# Patient Record
Sex: Male | Born: 1970 | Race: Black or African American | Hispanic: No | Smoking: Never smoker
Health system: Southern US, Community
[De-identification: ages and names within clinical notes are randomized; demographics above are authoritative.]

## PROBLEM LIST (undated history)

## (undated) DIAGNOSIS — E119 Type 2 diabetes mellitus without complications: Secondary | ICD-10-CM

## (undated) DIAGNOSIS — R569 Unspecified convulsions: Secondary | ICD-10-CM

## (undated) DIAGNOSIS — I10 Essential (primary) hypertension: Secondary | ICD-10-CM

---

## 2002-11-16 ENCOUNTER — Emergency Department (HOSPITAL_COMMUNITY): Admission: EM | Admit: 2002-11-16 | Discharge: 2002-11-16 | Payer: Self-pay | Admitting: *Deleted

## 2002-11-23 ENCOUNTER — Emergency Department (HOSPITAL_COMMUNITY): Admission: EM | Admit: 2002-11-23 | Discharge: 2002-11-23 | Payer: Self-pay | Admitting: Emergency Medicine

## 2003-06-10 ENCOUNTER — Ambulatory Visit (HOSPITAL_COMMUNITY): Admission: RE | Admit: 2003-06-10 | Discharge: 2003-06-10 | Payer: Self-pay | Admitting: General Surgery

## 2003-06-14 ENCOUNTER — Ambulatory Visit (HOSPITAL_COMMUNITY): Admission: RE | Admit: 2003-06-14 | Discharge: 2003-06-14 | Payer: Self-pay | Admitting: Family Medicine

## 2004-04-19 ENCOUNTER — Ambulatory Visit (HOSPITAL_COMMUNITY): Admission: RE | Admit: 2004-04-19 | Discharge: 2004-04-19 | Payer: Self-pay | Admitting: Family Medicine

## 2004-06-05 ENCOUNTER — Emergency Department (HOSPITAL_COMMUNITY): Admission: EM | Admit: 2004-06-05 | Discharge: 2004-06-05 | Payer: Self-pay | Admitting: Emergency Medicine

## 2004-12-21 ENCOUNTER — Emergency Department (HOSPITAL_COMMUNITY): Admission: EM | Admit: 2004-12-21 | Discharge: 2004-12-21 | Payer: Self-pay | Admitting: Emergency Medicine

## 2005-10-04 ENCOUNTER — Emergency Department (HOSPITAL_COMMUNITY): Admission: EM | Admit: 2005-10-04 | Discharge: 2005-10-04 | Payer: Self-pay | Admitting: Emergency Medicine

## 2006-02-18 ENCOUNTER — Emergency Department (HOSPITAL_COMMUNITY): Admission: EM | Admit: 2006-02-18 | Discharge: 2006-02-18 | Payer: Self-pay | Admitting: *Deleted

## 2011-07-31 ENCOUNTER — Encounter (HOSPITAL_COMMUNITY): Payer: Self-pay | Admitting: *Deleted

## 2011-07-31 ENCOUNTER — Emergency Department (HOSPITAL_COMMUNITY)
Admission: EM | Admit: 2011-07-31 | Discharge: 2011-07-31 | Disposition: A | Discharge status: Home / Self Care | Payer: Self-pay | Attending: Emergency Medicine | Admitting: Emergency Medicine

## 2011-07-31 DIAGNOSIS — J45909 Unspecified asthma, uncomplicated: Secondary | ICD-10-CM | POA: Insufficient documentation

## 2011-07-31 MED ORDER — PREDNISONE 20 MG PO TABS
60.0000 mg | ORAL_TABLET | Freq: Once | ORAL | Status: AC
  Administered 2011-07-31: 60 mg via ORAL
  Filled 2011-07-31: qty 3

## 2011-07-31 MED ORDER — ALBUTEROL SULFATE HFA 108 (90 BASE) MCG/ACT IN AERS: 1.0000 | INHALATION_SPRAY | Freq: Four times a day (QID) | RESPIRATORY_TRACT | Status: DC | PRN

## 2011-07-31 MED ORDER — PREDNISONE 10 MG PO TABS: 20.0000 mg | ORAL_TABLET | Freq: Every day | ORAL | Status: AC

## 2011-07-31 MED ORDER — IPRATROPIUM BROMIDE 0.02 % IN SOLN
0.5000 mg | Freq: Once | RESPIRATORY_TRACT | Status: AC
  Administered 2011-07-31: 0.5 mg via RESPIRATORY_TRACT
  Filled 2011-07-31: qty 2.5

## 2011-07-31 MED ORDER — ALBUTEROL SULFATE (5 MG/ML) 0.5% IN NEBU
2.5000 mg | INHALATION_SOLUTION | Freq: Once | RESPIRATORY_TRACT | Status: AC
  Administered 2011-07-31: 2.5 mg via RESPIRATORY_TRACT
  Filled 2011-07-31: qty 0.5

## 2011-07-31 NOTE — ED Provider Notes (Signed)
History   This chart was scribed for [employer]. Colon Branch, MD by Clarita Crane. The patient was seen in room APA12/APA12. Patient's care was started at 0616.    CSN: [id number]  Arrival date & time 07/31/11  0616   First MD Initiated Contact with Patient 07/31/11 [phone]      Chief Complaint  Patient presents with  . Asthma    (Consider location/radiation/quality/duration/timing/severity/associated sxs/prior treatment) HPI Ricardo Rogers is a 41 y.o. male who presents to the Emergency Department complaining of constant moderate SOB similar to previous exacerbations of asthma onset seven days ago and worsening since with associated wheezing and cough. Reports use of inhaler provided mild relief but states he recently ran out of his albuterol inhaler. Denies fever, chills, sore throat. Patient with h/o asthma, HTN and is a non-smoker.  PCP- [facility]  Past Medical History  Diagnosis Date  . Asthma     History reviewed. No pertinent past surgical history.  No family history on file.  History  Substance Use Topics  . Smoking status: Never Smoker   . Smokeless tobacco: Not on file  . Alcohol Use: No      Review of Systems 10 Systems reviewed and are negative for acute change except as noted in the HPI.  Allergies  Review of patient's allergies indicates no known allergies.  Home Medications   Current Outpatient Rx  Name Route Sig Dispense Refill  . ALBUTEROL SULFATE 1.25 MG/3ML IN NEBU Nebulization Take 1 ampule by nebulization every 6 (six) hours as needed.    . ALBUTEROL SULFATE HFA 108 (90 BASE) MCG/ACT IN AERS Inhalation Inhale 2 puffs into the lungs every 6 (six) hours as needed.      BP 159/110  Pulse 108  Temp(Src) 98.4 F (36.9 C) (Oral)  Resp 22  Ht 5\' 3"  (1.6 m)  Wt 210 lb (95.255 kg)  BMI 37.20 kg/m2  SpO2 97%  Physical Exam  Nursing note and vitals reviewed. Constitutional: He is oriented to person, place, and time. He appears well-developed and  well-nourished. No distress.  HENT:  Head: Normocephalic and atraumatic.  Mouth/Throat: Oropharynx is clear and moist.  Eyes: EOM are normal. Pupils are equal, round, and reactive to light.  Neck: Neck supple. No tracheal deviation present.  Cardiovascular: Normal rate and regular rhythm.  Exam reveals no gallop and no friction rub.   No murmur heard. Pulmonary/Chest: Effort normal. No respiratory distress. He has wheezes (bilaterally). He has no rales.       Good air movement.   Abdominal: Soft. He exhibits no distension.  Musculoskeletal: Normal range of motion. He exhibits no edema.  Neurological: He is alert and oriented to person, place, and time. No sensory deficit.  Skin: Skin is warm and dry.  Psychiatric: He has a normal mood and affect. His behavior is normal.    ED Course  Procedures (including critical care time)  DIAGNOSTIC STUDIES: Oxygen Saturation is 97% on room air, normal by my interpretation.    COORDINATION OF CARE: 7:31AM- Patient informed of current plan for treatment and evaluation and agrees with plan at this time.  8:30 Breathing better.      MDM  Patient with h/o asthma who ran out of inhaler and has had increased wheezing over the past few days. Given albuterol/atrovent nebulizer treatment with relief of wheezing. Initiated steroid therapy. Pt feels improved after observation and/or treatment in ED.Pt stable in ED with no significant deterioration in condition.The patient appears reasonably screened and/or stabilized  for discharge and I doubt any other medical condition or other [facility] requiring further screening, evaluation, or treatment in the ED at this time prior to discharge.  I personally performed the services described in this documentation, which was scribed in my presence. The recorded information has been reviewed and considered.   MDM Reviewed: nursing note and vitals         Nicoletta Dress. Colon Branch, MD 07/31/11 [phone]

## 2011-07-31 NOTE — ED Notes (Signed)
Pt sitting on stretcher with no complaints at present.

## 2011-07-31 NOTE — ED Notes (Signed)
Reports trouble breathing and asthma attack x 7 days; ran out of inhaler; states unable to "break" this asthma attack.

## 2011-07-31 NOTE — Discharge Instructions (Signed)
Asthma, Adult     Asthma is a disease of the lungs and can make it hard to breathe. Asthma cannot be cured, but medicine can help control it. Asthma may be started (triggered) by:  · Pollen.   · Dust.   · Animal skin flakes (dander).   · Molds.   · Foods.   · Respiratory infections (colds, flu).   · Smoke.   · Exercise.   · Stress.   · Other things that cause allergic reactions or allergies (allergens).   HOME CARE   · Talk to your doctor about how to manage your attacks at home. This may include:   · Using a tool called a peak flow meter.   · Having medicine ready to stop the attack.   · Take all medicine as told by your doctor.   · Wash bed sheets and blankets every week in hot water and put them in the dryer.   · Drink enough fluids to keep your pee (urine) clear or pale yellow.   · Always be ready to get emergency help. Write down the phone number for your doctor. Keep it where you can easily find it.   · Talk about exercise routines with your doctor.   · If animal dander is causing your asthma, you may need to find a new home for your pet(s).   GET HELP RIGHT AWAY IF:   · You have muscle aches.   · You cough more.   · You have chest pain.   · You have thick spit (sputum) that changes to yellow, green, gray, or bloody.   · Medicine does not stop your wheezing.   · You have problems breathing.   · You have a fever.   · Your medicine causes:   · A rash.   · Itching.   · Puffiness (swelling).   · Breathing problems.   MAKE SURE YOU:   · Understand these instructions.   · Will watch your condition.   · Will get help right away if you are not doing well or get worse.   Document Released: 11/07/2007 Document Revised: 01/31/2011 Document Reviewed: 03/31/2008  Exit[employer]® Patient Information ©2012 [employer].

## 2012-08-20 ENCOUNTER — Inpatient Hospital Stay (HOSPITAL_COMMUNITY)
Admission: EM | Admit: 2012-08-20 | Discharge: 2012-08-22 | DRG: 294 | Disposition: A | Discharge status: Home Health | Payer: BC Managed Care – PPO | Attending: Internal Medicine | Admitting: Internal Medicine

## 2012-08-20 ENCOUNTER — Encounter (HOSPITAL_COMMUNITY): Payer: Self-pay

## 2012-08-20 ENCOUNTER — Emergency Department (HOSPITAL_COMMUNITY): Payer: BC Managed Care – PPO

## 2012-08-20 DIAGNOSIS — J45909 Unspecified asthma, uncomplicated: Secondary | ICD-10-CM

## 2012-08-20 DIAGNOSIS — E669 Obesity, unspecified: Secondary | ICD-10-CM | POA: Diagnosis present

## 2012-08-20 DIAGNOSIS — R7309 Other abnormal glucose: Secondary | ICD-10-CM

## 2012-08-20 DIAGNOSIS — R7402 Elevation of levels of lactic acid dehydrogenase (LDH): Secondary | ICD-10-CM | POA: Diagnosis present

## 2012-08-20 DIAGNOSIS — I10 Essential (primary) hypertension: Secondary | ICD-10-CM

## 2012-08-20 DIAGNOSIS — R7401 Elevation of levels of liver transaminase levels: Secondary | ICD-10-CM

## 2012-08-20 DIAGNOSIS — R569 Unspecified convulsions: Secondary | ICD-10-CM

## 2012-08-20 DIAGNOSIS — IMO0001 Reserved for inherently not codable concepts without codable children: Principal | ICD-10-CM

## 2012-08-20 DIAGNOSIS — Z6841 Body Mass Index (BMI) 40.0 and over, adult: Secondary | ICD-10-CM

## 2012-08-20 DIAGNOSIS — R739 Hyperglycemia, unspecified: Secondary | ICD-10-CM

## 2012-08-20 DIAGNOSIS — E860 Dehydration: Secondary | ICD-10-CM

## 2012-08-20 HISTORY — DX: Essential (primary) hypertension: I10

## 2012-08-20 LAB — GLUCOSE, CAPILLARY
Glucose-Capillary: >600 mg/dL (ref 70–99)
Glucose-Capillary: 541 mg/dL — ABNORMAL HIGH (ref 70–99)
Glucose-Capillary: 429 mg/dL — ABNORMAL HIGH (ref 70–99)
Glucose-Capillary: 186 mg/dL — ABNORMAL HIGH (ref 70–99)
Glucose-Capillary: 181 mg/dL — ABNORMAL HIGH (ref 70–99)
Glucose-Capillary: 146 mg/dL — ABNORMAL HIGH (ref 70–99)

## 2012-08-20 LAB — BASIC METABOLIC PANEL
BUN: 12 mg/dL (ref 6–23)
BUN: 10 mg/dL (ref 6–23)
BUN: 10 mg/dL (ref 6–23)
CO2: 24 mEq/L (ref 19–32)
CO2: 25 mEq/L (ref 19–32)
Calcium: 9.4 mg/dL (ref 8.4–10.5)
Calcium: 9.3 mg/dL (ref 8.4–10.5)
Calcium: 9.1 mg/dL (ref 8.4–10.5)
Calcium: 9.1 mg/dL (ref 8.4–10.5)
Chloride: 89 mEq/L — ABNORMAL LOW (ref 96–112)
Chloride: 103 mEq/L (ref 96–112)
Creatinine, Ser: 1.04 mg/dL (ref 0.50–1.35)
Creatinine, Ser: 0.93 mg/dL (ref 0.50–1.35)
Creatinine, Ser: 0.95 mg/dL (ref 0.50–1.35)
Creatinine, Ser: 0.91 mg/dL (ref 0.50–1.35)
GFR calc Af Amer: >90 mL/min (ref >90)
GFR calc Af Amer: >90 mL/min (ref >90)
GFR calc Af Amer: >90 mL/min (ref >90)
GFR calc Af Amer: >90 mL/min (ref >90)
GFR calc non Af Amer: 86 mL/min — ABNORMAL LOW (ref >90)
GFR calc non Af Amer: >90 mL/min (ref >90)
GFR calc non Af Amer: 85 mL/min — ABNORMAL LOW (ref >90)
GFR calc non Af Amer: >90 mL/min (ref >90)
GFR calc non Af Amer: >90 mL/min (ref >90)
Glucose, Bld: 775 mg/dL (ref 70–99)
Glucose, Bld: 240 mg/dL — ABNORMAL HIGH (ref 70–99)
Potassium: 3.9 mEq/L (ref 3.5–5.1)
Potassium: 3.6 mEq/L (ref 3.5–5.1)
Sodium: 128 mEq/L — ABNORMAL LOW (ref 135–145)
Sodium: 136 mEq/L (ref 135–145)
Sodium: 139 mEq/L (ref 135–145)
Sodium: 139 mEq/L (ref 135–145)

## 2012-08-20 LAB — CBC WITH DIFFERENTIAL/PLATELET
Basophils Absolute: 0 10*3/uL (ref 0.0–0.1)
Basophils Relative: 0 % (ref 0–1)
Eosinophils Absolute: 0.1 10*3/uL (ref 0.0–0.7)
Eosinophils Relative: 1 % (ref 0–5)
HCT: 35.5 % — ABNORMAL LOW (ref 39.0–52.0)
Hemoglobin: 13 g/dL (ref 13.0–17.0)
Lymphocytes Relative: 21 % (ref 12–46)
Lymphs Abs: 1.7 10*3/uL (ref 0.7–4.0)
MCH: 28.6 pg (ref 26.0–34.0)
MCHC: 36.6 g/dL — ABNORMAL HIGH (ref 30.0–36.0)
MCV: 78.2 fL (ref 78.0–100.0)
Monocytes Absolute: 0.5 10*3/uL (ref 0.1–1.0)
Monocytes Relative: 6 % (ref 3–12)
Neutro Abs: 6 10*3/uL (ref 1.7–7.7)
Neutrophils Relative %: 72 % (ref 43–77)
Platelets: 287 10*3/uL (ref 150–400)
RBC: 4.54 MIL/uL (ref 4.22–5.81)
RDW: 13.6 % (ref 11.5–15.5)
WBC: 8.3 10*3/uL (ref 4.0–10.5)

## 2012-08-20 LAB — HEPATIC FUNCTION PANEL
ALT: 60 U/L — ABNORMAL HIGH (ref 0–53)
AST: 26 U/L (ref 0–37)
Albumin: 4.2 g/dL (ref 3.5–5.2)
Alkaline Phosphatase: 128 U/L — ABNORMAL HIGH (ref 39–117)
Bilirubin, Direct: 0.2 mg/dL (ref 0.0–0.3)
Indirect Bilirubin: 0.6 mg/dL (ref 0.3–0.9)
Total Bilirubin: 0.8 mg/dL (ref 0.3–1.2)
Total Protein: 7.9 g/dL (ref 6.0–8.3)

## 2012-08-20 LAB — URINALYSIS, ROUTINE W REFLEX MICROSCOPIC
Bilirubin Urine: NEGATIVE
Glucose, UA: >1000 mg/dL — AB
Leukocytes, UA: NEGATIVE
Nitrite: NEGATIVE
Specific Gravity, Urine: <1.005 — ABNORMAL LOW (ref 1.005–1.030)
Urobilinogen, UA: 0.2 mg/dL (ref 0.0–1.0)
pH: 5 (ref 5.0–8.0)

## 2012-08-20 LAB — RAPID URINE DRUG SCREEN, HOSP PERFORMED
Amphetamines: NOT DETECTED
Barbiturates: NOT DETECTED
Benzodiazepines: NOT DETECTED
Cocaine: NOT DETECTED
Opiates: NOT DETECTED
Tetrahydrocannabinol: NOT DETECTED

## 2012-08-20 LAB — URINE MICROSCOPIC-ADD ON

## 2012-08-20 LAB — TROPONIN I: Troponin I: <0.3 ng/mL (ref <0.30)

## 2012-08-20 MED ORDER — SODIUM CHLORIDE 0.9 % IV SOLN
INTRAVENOUS | Status: DC
  Filled 2012-08-20: qty 1

## 2012-08-20 MED ORDER — SODIUM CHLORIDE 0.9 % IV BOLUS (SEPSIS)
1000.0000 mL | Freq: Once | INTRAVENOUS | Status: AC
  Administered 2012-08-20: 1000 mL via INTRAVENOUS

## 2012-08-20 MED ORDER — ENOXAPARIN SODIUM 40 MG/0.4ML ~~LOC~~ SOLN
40.0000 mg | SUBCUTANEOUS | Status: DC
  Administered 2012-08-20 – 2012-08-21 (×2): 40 mg via SUBCUTANEOUS
  Filled 2012-08-20 (×2): qty 0.4

## 2012-08-20 MED ORDER — ONDANSETRON HCL 4 MG/2ML IJ SOLN: 4.0000 mg | Freq: Four times a day (QID) | INTRAMUSCULAR | Status: DC | PRN

## 2012-08-20 MED ORDER — HYDROCODONE-ACETAMINOPHEN 5-325 MG PO TABS
1.0000 | ORAL_TABLET | ORAL | Status: DC | PRN
  Filled 2012-08-20 (×2): qty 1

## 2012-08-20 MED ORDER — SODIUM CHLORIDE 0.9 % IV SOLN
INTRAVENOUS | Status: DC
  Administered 2012-08-20: 1000 mL via INTRAVENOUS
  Administered 2012-08-20: 16:00:00 via INTRAVENOUS

## 2012-08-20 MED ORDER — BIOTENE DRY MOUTH MT LIQD
15.0000 mL | Freq: Two times a day (BID) | OROMUCOSAL | Status: DC
  Administered 2012-08-21 (×2): 15 mL via OROMUCOSAL

## 2012-08-20 MED ORDER — ACETAMINOPHEN 325 MG PO TABS
650.0000 mg | ORAL_TABLET | Freq: Four times a day (QID) | ORAL | Status: DC | PRN
  Administered 2012-08-21 (×2): 650 mg via ORAL
  Filled 2012-08-20 (×2): qty 2

## 2012-08-20 MED ORDER — DEXTROSE 50 % IV SOLN: 25.0000 mL | INTRAVENOUS | Status: DC | PRN

## 2012-08-20 MED ORDER — CHLORHEXIDINE GLUCONATE 0.12 % MT SOLN
15.0000 mL | Freq: Two times a day (BID) | OROMUCOSAL | Status: DC
  Administered 2012-08-20: 15 mL via OROMUCOSAL
  Filled 2012-08-20: qty 15

## 2012-08-20 MED ORDER — POTASSIUM CHLORIDE 10 MEQ/100ML IV SOLN
10.0000 meq | INTRAVENOUS | Status: AC
  Administered 2012-08-20 (×2): 10 meq via INTRAVENOUS
  Filled 2012-08-20: qty 200

## 2012-08-20 MED ORDER — SODIUM CHLORIDE 0.9 % IV SOLN: INTRAVENOUS | Status: DC

## 2012-08-20 MED ORDER — HYDRALAZINE HCL 20 MG/ML IJ SOLN: 2.0000 mg | Freq: Four times a day (QID) | INTRAMUSCULAR | Status: DC | PRN

## 2012-08-20 MED ORDER — DEXTROSE-NACL 5-0.45 % IV SOLN: INTRAVENOUS | Status: DC

## 2012-08-20 MED ORDER — ONDANSETRON HCL 4 MG PO TABS: 4.0000 mg | ORAL_TABLET | Freq: Four times a day (QID) | ORAL | Status: DC | PRN

## 2012-08-20 MED ORDER — ENOXAPARIN SODIUM 40 MG/0.4ML ~~LOC~~ SOLN: 40.0000 mg | SUBCUTANEOUS | Status: DC

## 2012-08-20 MED ORDER — ALUM & MAG HYDROXIDE-SIMETH 200-200-20 MG/5ML PO SUSP: 30.0000 mL | Freq: Four times a day (QID) | ORAL | Status: DC | PRN

## 2012-08-20 MED ORDER — ACETAMINOPHEN 650 MG RE SUPP: 650.0000 mg | Freq: Four times a day (QID) | RECTAL | Status: DC | PRN

## 2012-08-20 NOTE — ED Provider Notes (Signed)
History     This chart was scribed for Ricardo Gaskins, MD, MD by Smitty Pluck, ED Scribe. The patient was seen in room APA06/APA06 and the patient's care was started at 12:02 PM.  CSN: [id number]  Arrival date & time 08/20/12  1137        Chief Complaint  Patient presents with  . Seizures  . Hyperglycemia     Patient is a 42 y.o. male presenting with seizures. The history is provided by the patient, medical records and a relative. No language interpreter was used.  Seizures Seizure activity on arrival: no   Seizure type:  Unable to specify Initial focality:  Unable to specify Return to baseline: yes   Severity:  Moderate Timing:  Once Number of seizures this episode:  1 Progression:  Resolved Recent head injury:  No recent head injuries PTA treatment:  None History of seizures: no    TAEVEON KEESLING is a 42 y.o. male who presents to the Emergency Department BIB EMS due to seizure today. Pt reports that he was in his living room alone and had the seizure. The person he was on the phone with called EMS. Pt is not post ictal. Pt does not have hx of seizures. He reports urinating on himself during the seizure. He states that he has mild headaches within last couple of days. He states that he has had fatigue and increased urinary frequency. Pt denies hitting head, neck pain, back pain, biting tongue, fever, chills, nausea, vomiting, diarrhea, weakness, cough, SOB and any other pain.   PCP is Dr. Loleta Chance  Past Medical History  Diagnosis Date  . Asthma     History reviewed. No pertinent past surgical history.  No family history on file.  History  Substance Use Topics  . Smoking status: Never Smoker   . Smokeless tobacco: Not on file  . Alcohol Use: No      Review of Systems  Constitutional: Positive for fatigue.  Neurological: Positive for seizures and headaches.  All other systems reviewed and are negative.    Allergies  Review of patient's allergies indicates no  known allergies.  Home Medications   Current Outpatient Rx  Name  Route  Sig  Dispense  Refill  . albuterol (ACCUNEB) 1.25 MG/3ML nebulizer solution   Nebulization   Take 1 ampule by nebulization every 6 (six) hours as needed.         Marland Kitchen albuterol (PROVENTIL HFA;VENTOLIN HFA) 108 (90 BASE) MCG/ACT inhaler   Inhalation   Inhale 2 puffs into the lungs every 6 (six) hours as needed.         Marland Kitchen EXPIRED: albuterol (PROVENTIL HFA;VENTOLIN HFA) 108 (90 BASE) MCG/ACT inhaler   Inhalation   Inhale 1-2 puffs into the lungs every 6 (six) hours as needed for wheezing.   1 Inhaler   0     BP 168/76  Pulse 115  Temp(Src) 97.9 F (36.6 C) (Oral)  Resp 20  SpO2 93%  Physical Exam  Nursing note and vitals reviewed. CONSTITUTIONAL: Well developed/well nourished HEAD: Normocephalic/atraumatic EYES: EOMI/PERRL ENMT: Mucous membranes moist, no tongue lacerations, No evidence of facial/nasal trauma NECK: supple no meningeal signs SPINE:entire spine nontender, No bruising/crepitance/stepoffs noted to spine CV: S1/S2 noted, no murmurs/rubs/gallops noted LUNGS: Lungs are clear to auscultation bilaterally, no apparent distress ABDOMEN: soft, nontender, no rebound or guarding GU:no cva tenderness NEURO: Pt is awake/alert, moves all extremitiesx4, no arm or leg weakness noted, no facial droop EXTREMITIES: pulses normal, full ROM  SKIN: warm, color normal PSYCH: no abnormalities of mood noted   ED Course  Procedures (including critical care time) DIAGNOSTIC STUDIES: Oxygen Saturation is 93% on room air, low by my interpretation.    COORDINATION OF CARE: 12:05 PM Discussed ED treatment with pt and pt agrees.   1:51 PM Pt improved, stable currently, no neuro deficits, well appearing, and no active seizure here He appears to be new onset diabetes.  This may have triggered seizure.  His CT head is negative D/w dr Karilyn Cota, will admit to stepdown Will continue IV fluids and then likely  insulin drip  Labs Reviewed  GLUCOSE, CAPILLARY - Abnormal; Notable for the following:    Glucose-Capillary >600 (*)    All other components within normal limits  BASIC METABOLIC PANEL  CBC WITH DIFFERENTIAL      MDM  Nursing notes including past medical history and social history reviewed and considered in documentation Labs/vital reviewed and considered     Date: 08/20/2012  Rate: 108  Rhythm: sinus tachycardia  QRS Axis: normal  Intervals: normal  ST/T Wave abnormalities: nonspecific ST changes  Conduction Disutrbances:none  Narrative Interpretation:   Old EKG Reviewed: none available at time of interpretation     I personally performed the services described in this documentation, which was scribed in my presence. The recorded information has been reviewed and is accurate.      Ricardo Gaskins, MD 08/20/12 1351

## 2012-08-20 NOTE — ED Notes (Signed)
Lab called critical glucose of 775. MD aware

## 2012-08-20 NOTE — Plan of Care (Signed)
Problem: Consults Goal: Diabetes Mellitus Patient Education See Patient Education Module for education specifics. Outcome: Progressing Diabetes Consult Ordered, handouts given in unit by primary RN. Goal: Diagnosis-Diabetes Mellitus Outcome: Progressing New Onset Type II   Dx is listed as seizures, but this is new onset problem with blood sugars for pt Goal: Nutrition Consult-if indicated Outcome: Progressing ordered Goal: Diabetes Guidelines if Diabetic/Glucose > 140 If diabetic or lab glucose is > 140 mg/dl - Initiate Diabetes/Hyperglycemia Guidelines & Document Interventions  Outcome: Progressing On glucostabelizer, cbgs are trending down  Problem: Phase I Progression Outcomes Goal: Hemodynamically stable Outcome: Progressing Increased bp Goal: Diabetes Coordinator Consult Outcome: Completed/Met Date Met:  08/20/12 ordered

## 2012-08-20 NOTE — H&P (Signed)
[facility] History and Physical  Ricardo Rogers ZOX:[id number] DOB: [birth year] DOA: 08/20/2012  Referring physician:  PCP: Evlyn Courier, MD  Specialists:   Chief Complaint: seizure  HPI: Ricardo Rogers is a 42 y.o. male with pmhx including HTN, asthma but has not seen PCP in many years, presents to ED via EMS with cc seizure. Information obtained from patient who reports this am while lying on couch talking on phone his feet and legs began to tremble and he could not control it. This trembling ascended up his body until whole body "jerking" and he could not stop. Denies losing consciousness. He was incontinent of urine. He reports that it lasted about 2 minutes and he called family. The person on the phone with him called EMS. By the time they arrived, he was able to open door for them. He reports excessive thirst over last [redacted] weeks along with intermittent headache and one episode of blurred vision that lasted several hours. He denies recent illness, fever, chills, nausea/vominting, diarrhea, constipation, dysuria or hematuria. He does report urinary frequency. In the ED CT head without acute abnormality and glucose >700, urine with trace protein and trace keytone otherwise unremarkable, EKG with ST. Symptoms came on gradually, have persisted characterized as severe. We are asked to admit.    Review of Systems: The patient denies anorexia, fever, weight loss,, vision loss, decreased hearing, hoarseness, chest pain, syncope, dyspnea on exertion, peripheral edema, balance deficits, hemoptysis, abdominal pain, melena, hematochezia, severe indigestion/heartburn, hematuria, incontinence, genital sores, muscle weakness, suspicious skin lesions, transient blindness, difficulty walking, depression, unusual weight change, abnormal bleeding, enlarged lymph nodes, angioedema, and breast masses.    Past Medical History  Diagnosis Date  . Asthma   . HTN (hypertension)    History reviewed. No pertinent  past surgical history. Social History:  reports that he has never smoked. He does not have any smokeless tobacco history on file. He reports that he does not drink alcohol or use illicit drugs. 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 Lives with mother as is separated from wife.  No Known Allergies  No family history on file. Mother 39 with DM and HTN. Father medical hx unknown.   Prior to Admission medications   Not on File   Physical Exam: Filed Vitals:   08/20/12 1144 08/20/12 1210 08/20/12 1405 08/20/12 1510  BP: 168/76 156/99 159/112 161/105  Pulse: 115 108 101 101  Temp: 97.9 F (36.6 C)     TempSrc: Oral   Oral  Resp: 20  20   Height:    5' (1.524 m)  Weight:    92.3 kg (203 lb 7.8 oz)  SpO2: 93% 94% 97% 98%     General:  Obese, alert NAD  Eyes: PERRL EOMI no scleral icterus  ENT: ears clear, nose without drainage, oropharynx without exudate, dry/ pink. Poor dentition  Neck: supple no JVD full rom no lymphadenopathy  Cardiovascular: tachycardic, regular no MGR No LEE PPP  Respiratory: normal effort. Somewhat diminished on left otherwise no wheeze/rhonchi/crackles  Abdomen: obese, soft sluggish bowel sounds. Non-tender to palpation  Skin: warm dry no rash lesion  Musculoskeletal: moves all extremities, no clubbing cyanosis  Psychiatric: calm cooperative appropriate  Neurologic: cranial nerve II-XII intact speech slow but clear.   Labs on Admission:  Basic Metabolic Panel:  Recent Labs Lab 08/20/12 1204  NA 128*  K 3.9  CL 89*  CO2 24  GLUCOSE 775*  BUN 12  CREATININE  1.04  CALCIUM 9.4   Liver Function Tests:  Recent Labs Lab 08/20/12 1205  AST 26  ALT 60*  ALKPHOS 128*  BILITOT 0.8  PROT 7.9  ALBUMIN 4.2   No results found for this basename: LIPASE, AMYLASE,  in the last 168 hours No results found for this basename: AMMONIA,  in the last 168 hours CBC:  Recent Labs Lab 08/20/12 1204  WBC 8.3  NEUTROABS 6.0   HGB 13.0  HCT 35.5*  MCV 78.2  PLT 287   Cardiac Enzymes:  Recent Labs Lab 08/20/12 1205  TROPONINI <0.30    BNP (last 3 results) No results found for this basename: PROBNP,  in the last 8760 hours CBG:  Recent Labs Lab 08/20/12 1143 08/20/12 1408  GLUCAP >600* 541*    Radiological Exams on Admission: Ct Head Wo Contrast  08/20/2012  *RADIOLOGY REPORT*  Clinical Data: New onset seizure  CT HEAD WITHOUT CONTRAST  Technique:  Contiguous axial images were obtained from the base of the skull through the vertex without contrast.  Comparison: None.  Findings: Generalized atrophy, moderately advanced for the patient's age.  Negative for hydrocephalus.  Negative for acute or chronic infarct.  Negative for hemorrhage or mass.  Air-fluid level left sphenoid sinus may represent acute sinusitis.  IMPRESSION: Generalized atrophy.  No acute intracranial abnormality.  Air-fluid level left sphenoid sinus.   Original Report Authenticated By: Janeece Riggers, M.D.     EKG: Independently reviewed. ST  Assessment/Plan Principal Problem:   Seizures: likely related to hyperglycemia. Will admit to SD. CT head without acute abnormalities. Neuro exam benign. Will place on seizure precautions until glucose controlled.  Active Problems:   Hyperglycemia: new DM dx. Will start IV insulin with glucomander. Currently anion gap 15. Sodium 128 chloride 89. Provide IV fluids per protocol. BMET q2hours. Will request diabetes coordinator.  Monitor closely on step-down.    Unspecified asthma: currently stable at baseline. Nebs as needed.     HTN (hypertension): reports being diagnosed with same in past but not on meds. Will use IV hydralazine for now. Will likely need ACE inhibitor for optimal control.     Elevated transaminase level: mild. May be reactive. monitor       Code Status: full Family Communication: mother at bedside Disposition Plan: home when ready likely 3-4 days  Time spent: 75  minutes  [facility] M [facility]   If 7PM-7AM, please contact night-coverage [email or web address] Password [email or web address] 08/20/2012, 3:28 PM Attending: Patient seen and examined. The above note reviewed. 42 year old man presents with newly diagnosed diabetes in a hyperosmolar state associated with neurological sequelae. This should reverse as he is treated. Agree with IV insulin, IV fluids and intravenous hydralazine for blood pressure control for the time being. He will need ACE inhibitor once he is able to take by mouth. He will need diabetes education.

## 2012-08-20 NOTE — ED Notes (Addendum)
Pt via EMS due to seizure like activity. EMS arrived to find that patient had urinated on himself, possible syncopal episode per EMS. Not postictal upon EMS arrival. Blood sugar reading high. IV started and fluids given by EMS.

## 2012-08-21 LAB — GLUCOSE, CAPILLARY
Glucose-Capillary: 125 mg/dL — ABNORMAL HIGH (ref 70–99)
Glucose-Capillary: 146 mg/dL — ABNORMAL HIGH (ref 70–99)
Glucose-Capillary: 282 mg/dL — ABNORMAL HIGH (ref 70–99)
Glucose-Capillary: 289 mg/dL — ABNORMAL HIGH (ref 70–99)

## 2012-08-21 LAB — CBC
HCT: 36.4 % — ABNORMAL LOW (ref 39.0–52.0)
MCH: 28.6 pg (ref 26.0–34.0)
MCHC: 35.7 g/dL (ref 30.0–36.0)
MCV: 80 fL (ref 78.0–100.0)
Platelets: 284 10*3/uL (ref 150–400)
RDW: 14.2 % (ref 11.5–15.5)

## 2012-08-21 MED ORDER — INSULIN DETEMIR 100 UNIT/ML ~~LOC~~ SOLN
15.0000 [IU] | Freq: Every day | SUBCUTANEOUS | Status: DC
  Filled 2012-08-21: qty 0.15

## 2012-08-21 MED ORDER — INSULIN ASPART 100 UNIT/ML ~~LOC~~ SOLN
0.0000 [IU] | Freq: Three times a day (TID) | SUBCUTANEOUS | Status: DC
  Administered 2012-08-21: 8 [IU] via SUBCUTANEOUS
  Administered 2012-08-21: 3 [IU] via SUBCUTANEOUS
  Administered 2012-08-21 – 2012-08-22 (×2): 8 [IU] via SUBCUTANEOUS

## 2012-08-21 MED ORDER — METFORMIN HCL 500 MG PO TABS
500.0000 mg | ORAL_TABLET | Freq: Two times a day (BID) | ORAL | Status: DC
  Administered 2012-08-21: 500 mg via ORAL
  Filled 2012-08-21: qty 1

## 2012-08-21 MED ORDER — INSULIN ASPART 100 UNIT/ML ~~LOC~~ SOLN
0.0000 [IU] | Freq: Every day | SUBCUTANEOUS | Status: DC
  Administered 2012-08-21: 2 [IU] via SUBCUTANEOUS

## 2012-08-21 MED ORDER — LISINOPRIL 5 MG PO TABS
5.0000 mg | ORAL_TABLET | Freq: Every day | ORAL | Status: DC
  Administered 2012-08-21 – 2012-08-22 (×2): 5 mg via ORAL
  Filled 2012-08-21 (×2): qty 1

## 2012-08-21 MED ORDER — GLIPIZIDE 5 MG PO TABS
5.0000 mg | ORAL_TABLET | Freq: Two times a day (BID) | ORAL | Status: DC
  Administered 2012-08-21: 5 mg via ORAL
  Filled 2012-08-21: qty 1

## 2012-08-21 MED ORDER — GLIPIZIDE 5 MG PO TABS: 5.0000 mg | ORAL_TABLET | Freq: Every day | ORAL | Status: DC

## 2012-08-21 MED ORDER — SODIUM CHLORIDE 0.9 % IV SOLN
INTRAVENOUS | Status: DC
  Administered 2012-08-21: 03:00:00 via INTRAVENOUS
  Filled 2012-08-21 (×2): qty 1000

## 2012-08-21 MED ORDER — POTASSIUM CHLORIDE IN NACL 20-0.9 MEQ/L-% IV SOLN
INTRAVENOUS | Status: DC
  Administered 2012-08-21 – 2012-08-22 (×2): via INTRAVENOUS

## 2012-08-21 MED ORDER — LIVING WELL WITH DIABETES BOOK
Freq: Once | Status: DC
  Filled 2012-08-21: qty 1

## 2012-08-21 MED ORDER — INSULIN DETEMIR 100 UNIT/ML ~~LOC~~ SOLN
10.0000 [IU] | Freq: Once | SUBCUTANEOUS | Status: AC
  Administered 2012-08-21: 10 [IU] via SUBCUTANEOUS
  Filled 2012-08-21: qty 0.1

## 2012-08-21 MED ORDER — METFORMIN HCL 500 MG PO TABS
1000.0000 mg | ORAL_TABLET | Freq: Two times a day (BID) | ORAL | Status: DC
  Administered 2012-08-21 – 2012-08-22 (×2): 1000 mg via ORAL
  Filled 2012-08-21 (×2): qty 2

## 2012-08-21 MED ORDER — INSULIN DETEMIR 100 UNIT/ML ~~LOC~~ SOLN
SUBCUTANEOUS | Status: AC
  Filled 2012-08-21: qty 1

## 2012-08-21 NOTE — Progress Notes (Addendum)
Insulin drip stopped now, 1h after levamir admin per protocol  Unable to document on insulin drip after orders for fluid change and levamir were entered by MD, insulin drip was d/c'd in EPIC order management at that time and therefore I could no longer document the change in drip rates any longer  Will cont to monitor

## 2012-08-21 NOTE — Progress Notes (Signed)
Called report to Scherrie Merritts, RN. Pt going to room 305 and family is at bedside. Personal belongings taken by mother and pt transported via wheelchair. Pt tolerated well.

## 2012-08-21 NOTE — Progress Notes (Signed)
New IVF orders:  NS w/ 20K+ @ 150  Scanned bag and error msg came up stating there were no orders for that to be admin.  Rechecked the orders, scanned again, still got the same msg. Got charge nurse, Rockwell Germany, RN to verify behind me.  She found correct order and I had correct bag of IVF as well, she second verified the admin of the new IVF prior to starting  Will cont to monitor

## 2012-08-21 NOTE — Progress Notes (Signed)
Nutrition Education  Attempted to provide DM diet education. Pt declined verbal instruction. He accepted "Carbohydrate Counting" handouts and replied he would discuss them with his mother. Will follow-up with him tomorrow to see if he has questions.  Royann Shivers MS,RD,LDN,CSG Office: [phone] Pager: [phone]

## 2012-08-21 NOTE — Progress Notes (Signed)
Gave pt his admission packet  Educational Handouts Given:  Diabetes, Frequently Asked Questions Diabetes and Standards of Medical Care Screening for Type 2 Diabetes Monitoring for Diabetes How to Avoid Diabetes Problems

## 2012-08-21 NOTE — Progress Notes (Signed)
Inpatient Diabetes Program Recommendations  AACE/ADA: New Consensus Statement on Inpatient Glycemic Control (2013)  Target Ranges:  Prepandial:   less than 140 mg/dL      Peak postprandial:   less than 180 mg/dL (1-2 hours)      Critically ill patients:  140 - 180 mg/dL      Note: Spoke with pt about new diabetes diagnosis. Provided information on diabetes basics, carbohydrate counting, and medications used to treat diabetes.   Discussed basic pathophysiology of DM Type 2, basic home care, how to check blood glucose, importance of checking CBGs and maintaining good CBG control to prevent long-term and short-term complications.  Also explained what an A1C is and talked about the ranges and target of <7%.  Informed patient that we do not have the results of his A1C back yet but I will follow up with him tomorrow and provide him with the results then.  Reviewed signs and symptoms of hyperglycemia and hypoglycemia along with proper treatement.  Talked with patient extensively about dietary changes and how to follow a diabetic diet.  Patient states that his mother is a diabetic and that she will help him out and assist him with what he should and should not eat.  Patient reports that he drinks a lot of sodas and juices, so we discussed the amount of sugar and carbohydrates in regular sodas and juices.  When asking the patient to teach back information we have discussed, he is able to teach back how to monitor his glucose and states that he will need to make a lot of changes in his diet.  He will need ongoing education and reinforcement on diabetes education and self-care behaviors.  Have ordered the Living Well with Diabetes book and have asked patient to watch DM education videos.  Will continue to follow as an inpatient.  Informed patient that I would like him to review the material supplied, the Living Well with Diabetes booklet, and watch the DM videos and that I will follow up with him tomorrow to  re-educate and answer any questions or concerns that he may have.  Thanks, Orlando Penner, RN, BSN, CCRN Diabetes Coordinator Inpatient Diabetes Program [phone]

## 2012-08-21 NOTE — Progress Notes (Signed)
UR Chart Review Completed  

## 2012-08-21 NOTE — Plan of Care (Signed)
Problem: Consults Goal: Diagnosis-Diabetes Mellitus New Onset Type II      

## 2012-08-21 NOTE — Progress Notes (Signed)
[facility] PROGRESS NOTE  Ricardo Rogers ZOX:[id number] DOB: 1970/11/10 DOA: 08/20/2012 PCP: Evlyn Courier, MD  Assessment/Plan: Seizures: likely related to hyperglycemia. No further seizure activity. CT head without acute abnormalities. Neuro exam remains benign.   Active Problems:  Hyperglycemia: new DM dx. Off insulin drip. CBG range 121-194. Anion gap 12 at 11pm last evening. CMET pending this am. Start metformin, continue lantus and SSI. Will transfer to med/surg.    Unspecified asthma: remains stable at baseline. Nebs as needed.   HTN (hypertension): uncontrolled. Pt reports never taking anti-hypertensive meds in past. Will start lisinopril. Continue IV hydralazine prn for now.    Elevated transaminase level: mild. CMET pending this am.       Code Status: code Family Communication:  Disposition Plan: home when ready hopefully tomorrow   Consultants:  none  Procedures:  none  Antibiotics:  none  HPI/Subjective: Awake. Denies pain/discomfort. Slept well  Objective: Filed Vitals:   08/21/12 0345 08/21/12 0400 08/21/12 0500 08/21/12 0751  BP: 143/87 138/97 135/98   Pulse:      Temp:  98.5 F (36.9 C)  98.1 F (36.7 C)  TempSrc:  Oral  Oral  Resp: 12 14 16    Height:      Weight:   95.1 kg (209 lb 10.5 oz)   SpO2:  97%      Intake/Output Summary (Last 24 hours) at 08/21/12 8119 Last data filed at 08/21/12 0500  Gross per 24 hour  Intake 1853.96 ml  Output    600 ml  Net 1253.96 ml   Filed Weights   08/20/12 1510 08/21/12 0500  Weight: 92.3 kg (203 lb 7.8 oz) 95.1 kg (209 lb 10.5 oz)    Exam:   General:  Awake alert NAD  Cardiovascular: RRR no MGR No LEE   Respiratory: normal effort BSCTAB but slightly distant. No wheeze  Abdomen: obese, soft +BS non-tender to palpation  Musculoskeletal: moves all extremities. No joint swelling/erythema   Data Reviewed: Basic Metabolic Panel:  Recent Labs Lab 08/20/12 1204 08/20/12 1625  08/20/12 1841 08/20/12 2024 08/20/12 2228  NA 128* 136 139 138 139  K 3.9 3.4* 3.6 3.5 3.3*  CL 89* 99 102 103 103  CO2 24 25 29 26 24   GLUCOSE 775* 400* 240* 196* 156*  BUN 12 11 10 10 10   CREATININE 1.04 0.93 1.05 0.95 0.91  CALCIUM 9.4 9.3 9.1 9.1 9.1   Liver Function Tests:  Recent Labs Lab 08/20/12 1205  AST 26  ALT 60*  ALKPHOS 128*  BILITOT 0.8  PROT 7.9  ALBUMIN 4.2   No results found for this basename: LIPASE, AMYLASE,  in the last 168 hours No results found for this basename: AMMONIA,  in the last 168 hours CBC:  Recent Labs Lab 08/20/12 1204 08/21/12 0458  WBC 8.3 6.6  NEUTROABS 6.0  --   HGB 13.0 13.0  HCT 35.5* 36.4*  MCV 78.2 80.0  PLT 287 284   Cardiac Enzymes:  Recent Labs Lab 08/20/12 1205  TROPONINI <0.30   BNP (last 3 results) No results found for this basename: PROBNP,  in the last 8760 hours CBG:  Recent Labs Lab 08/20/12 2308 08/21/12 0017 08/21/12 0121 08/21/12 0223 08/21/12 0722  GLUCAP 148* 146* 125* 141* 194*    Recent Results (from the past 240 hour(s))  MRSA PCR SCREENING     Status: None   Collection Time    08/20/12  4:33 PM      Result Value Range  Status   MRSA by PCR NEGATIVE  NEGATIVE Final   Comment:            The GeneXpert MRSA Assay (FDA     approved for NASAL specimens     only), is one component of a     comprehensive MRSA colonization     surveillance program. It is not     intended to diagnose MRSA     infection nor to guide or     monitor treatment for     MRSA infections.     Studies: Ct Head Wo Contrast  08/20/2012  *RADIOLOGY REPORT*  Clinical Data: New onset seizure  CT HEAD WITHOUT CONTRAST  Technique:  Contiguous axial images were obtained from the base of the skull through the vertex without contrast.  Comparison: None.  Findings: Generalized atrophy, moderately advanced for the patient's age.  Negative for hydrocephalus.  Negative for acute or chronic infarct.  Negative for hemorrhage or  mass.  Air-fluid level left sphenoid sinus may represent acute sinusitis.  IMPRESSION: Generalized atrophy.  No acute intracranial abnormality.  Air-fluid level left sphenoid sinus.   Original Report Authenticated By: Janeece Riggers, M.D.     Scheduled Meds: . antiseptic oral rinse  15 mL Mouth Rinse q12n4p  . chlorhexidine  15 mL Mouth Rinse BID  . enoxaparin (LOVENOX) injection  40 mg Subcutaneous Q24H  . insulin aspart  0-15 Units Subcutaneous TID WC  . insulin aspart  0-5 Units Subcutaneous QHS  . lisinopril  5 mg Oral Daily  . metFORMIN  500 mg Oral BID WC   Continuous Infusions: . 0.9 % NaCl with KCl 20 mEq / L      Principal Problem:   Seizures Active Problems:   Hyperglycemia   Unspecified asthma   HTN (hypertension)   Elevated transaminase level    Time spent: 30 minutes    [facility] M  [facility]  If 7PM-7AM, please contact night-coverage at [email or web address], password [facility] 08/21/2012, 8:22 AM  LOS: 1 day   Attending: Patient seen and examined. Agree with note above. Start metformin and ACE inhibitor for hypertension. Diabetic education. Hopefully, discharge home tomorrow. He will need close followup with primary care physician.

## 2012-08-22 DIAGNOSIS — IMO0001 Reserved for inherently not codable concepts without codable children: Principal | ICD-10-CM

## 2012-08-22 LAB — COMPREHENSIVE METABOLIC PANEL
Alkaline Phosphatase: 90 U/L (ref 39–117)
BUN: 7 mg/dL (ref 6–23)
GFR calc Af Amer: >90 mL/min (ref >90)
GFR calc non Af Amer: >90 mL/min (ref >90)
Glucose, Bld: 271 mg/dL — ABNORMAL HIGH (ref 70–99)
Potassium: 3.3 mEq/L — ABNORMAL LOW (ref 3.5–5.1)
Total Bilirubin: 0.8 mg/dL (ref 0.3–1.2)
Total Protein: 6.3 g/dL (ref 6.0–8.3)

## 2012-08-22 MED ORDER — LISINOPRIL 5 MG PO TABS: 5.0000 mg | ORAL_TABLET | Freq: Every day | ORAL | Status: DC

## 2012-08-22 MED ORDER — POTASSIUM CHLORIDE CRYS ER 20 MEQ PO TBCR
40.0000 meq | EXTENDED_RELEASE_TABLET | Freq: Once | ORAL | Status: AC
  Administered 2012-08-22: 40 meq via ORAL
  Filled 2012-08-22: qty 2

## 2012-08-22 MED ORDER — GLIPIZIDE 10 MG PO TABS: 10.0000 mg | ORAL_TABLET | Freq: Two times a day (BID) | ORAL | Status: DC

## 2012-08-22 MED ORDER — METFORMIN HCL 1000 MG PO TABS: 1000.0000 mg | ORAL_TABLET | Freq: Two times a day (BID) | ORAL | Status: DC

## 2012-08-22 MED ORDER — ALUM & MAG HYDROXIDE-SIMETH 200-200-20 MG/5ML PO SUSP: 30.0000 mL | Freq: Four times a day (QID) | ORAL | Status: DC | PRN

## 2012-08-22 MED ORDER — GLIPIZIDE 5 MG PO TABS
10.0000 mg | ORAL_TABLET | Freq: Two times a day (BID) | ORAL | Status: DC
  Administered 2012-08-22: 10 mg via ORAL
  Filled 2012-08-22: qty 2

## 2012-08-22 NOTE — Discharge Summary (Addendum)
Physician Discharge Summary  Ricardo Rogers EAV:[id number] DOB: [birth year] DOA: 08/20/2012  PCP: Evlyn Courier, MD  Admit date: 08/20/2012 Discharge date: 08/22/2012  Time spent: 40 minutes  Recommendations for Outpatient Follow-up:  1. Has appointment with Dr Loleta Chance 09/02/12 at 2pm. Will need follow up to newly diagnosed DM. Being discharged on oral agents. Will need BP evaluation as Lisinopril started.   Discharge Diagnoses:  Principal Problem:   Seizures Active Problems:  Newly diagnosed uncontrolled Type 2 Diabetes Mellitus   Unspecified asthma   HTN (hypertension)   Elevated transaminase level   Discharge Condition: stable at discharge  Diet recommendation: carb modified  Filed Weights   08/20/12 1510 08/21/12 0500  Weight: 92.3 kg (203 lb 7.8 oz) 95.1 kg (209 lb 10.5 oz)    History of present illness:  Ricardo Rogers is a 42 y.o. male with pmhx including HTN, asthma but has not seen PCP in many years, presented to ED via EMS on 08/20/12 with cc seizure. Information obtained from patient who reported in am of admission, while lying on couch talking on phone his feet and legs began to tremble and he could not control it. This trembling ascended up his body until whole body "jerking" and he could not stop. Denied losing consciousness. He was incontinent of urine. He reportd that it lasted about 2 minutes and he called family. The person on the phone with him called EMS. By the time they arrived, he was able to open door for them. He reported excessive thirst over last [redacted] weeks along with intermittent headache and one episode of blurred vision that lasted several hours. He denied recent illness, fever, chills, nausea/vominting, diarrhea, constipation, dysuria or hematuria. He did report urinary frequency. In the ED CT head without acute abnormality and glucose >700, urine with trace protein and trace keytone otherwise unremarkable, EKG with ST. He was admitted to step down and insulin drip  initiated   Hospital Course:  Seizures: Pt admitted to [place]. CT head without acute abnormalities. Felt likely related to severe hyperglycemia. No further seizure like activity during hospitalization.  Neuro exam remained benign.  Active Problems:  Hyperglycemia: new DM dx. Glucose greater than 700 and anion gap 15 at admission.  IV insulin initiated using glucomander protocol. Pt responded positively and insulin drip discontinued 08/21/12.  Metformin and glipizide initiated. CBG's monitored and SSI used for glycemic control.  Pt received diabetes education from diabetic coordinator. He demonstrated his ability to monitor CBG. He will follow up with Dr Loleta Chance 09/02/12. At discharge CBG range 216-290.    Unspecified asthma: remained stable at baseline during this hospitalization.    HTN (hypertension): uncontrolled on admission. Pt reports never taking anti-hypertensive meds in past. Lisinopril initiated. Will need close OP follow up for optimal control. At discharge SBP range 111-138.   Elevated transaminase level: mild. ALT 60 at discharge other values within normal limits.       Procedures:  none  Consultations:  none  Discharge Exam: Filed Vitals:   08/21/12 1600 08/21/12 1700 08/21/12 2012 08/22/12 0445  BP: 111/70 113/77 117/75 138/78  Pulse:   105 99  Temp:   98 F (36.7 C) 97.4 F (36.3 C)  TempSrc:   Oral   Resp: 18 24 20 20   Height:      Weight:      SpO2:   97% 97%    General: awake alert obese NAD Cardiovascular: RRR No MGR No LEE PPP Respiratory: normal effort. BS slightly distant  but clear bilaterally. No wheeze/rhonchi  Discharge Instructions  Discharge Orders   Future Orders Complete By Expires     Call MD for:  persistant dizziness or light-headedness  As directed     Call MD for:  persistant nausea and vomiting  As directed     Diet Carb Modified  As directed     Discharge instructions  As directed     Comments:      Monitor CBG twice daily. Document  readings to take to MD appointment Follow carb modified diet Take medication as prescribed    Increase activity slowly  As directed         Medication List    TAKE these medications       alum & mag hydroxide-simeth 200-200-20 MG/5ML suspension  Commonly known as:  MAALOX/MYLANTA  Take 30 mLs by mouth every 6 (six) hours as needed.     glipiZIDE 10 MG tablet  Commonly known as:  GLUCOTROL  Take 1 tablet (10 mg total) by mouth 2 (two) times daily before a meal.     lisinopril 5 MG tablet  Commonly known as:  PRINIVIL,ZESTRIL  Take 1 tablet (5 mg total) by mouth daily.     metFORMIN 1000 MG tablet  Commonly known as:  GLUCOPHAGE  Take 1 tablet (1,000 mg total) by mouth 2 (two) times daily with a meal.           Follow-up Information   Follow up with Evlyn Courier, MD On 09/02/2012. (Has appointment at 2pm. Please take daily CBG reading documentation to appointment. )    Contact information:   [address] [place] [address] [phone]        The results of significant diagnostics from this hospitalization (including imaging, microbiology, ancillary and laboratory) are listed below for reference.    Significant Diagnostic Studies: Ct Head Wo Contrast  08/20/2012  *RADIOLOGY REPORT*  Clinical Data: New onset seizure  CT HEAD WITHOUT CONTRAST  Technique:  Contiguous axial images were obtained from the base of the skull through the vertex without contrast.  Comparison: None.  Findings: Generalized atrophy, moderately advanced for the patient's age.  Negative for hydrocephalus.  Negative for acute or chronic infarct.  Negative for hemorrhage or mass.  Air-fluid level left sphenoid sinus may represent acute sinusitis.  IMPRESSION: Generalized atrophy.  No acute intracranial abnormality.  Air-fluid level left sphenoid sinus.   Original Report Authenticated By: Janeece Riggers, M.D.     Microbiology: Recent Results (from the past 240 hour(s))  MRSA PCR SCREENING      Status: None   Collection Time    08/20/12  4:33 PM      Result Value Range Status   MRSA by PCR NEGATIVE  NEGATIVE Final   Comment:            The GeneXpert MRSA Assay (FDA     approved for NASAL specimens     only), is one component of a     comprehensive MRSA colonization     surveillance program. It is not     intended to diagnose MRSA     infection nor to guide or     monitor treatment for     MRSA infections.     Labs: Basic Metabolic Panel:  Recent Labs Lab 08/20/12 1625 08/20/12 1841 08/20/12 2024 08/20/12 2228 08/22/12 0504  NA 136 139 138 139 135  K 3.4* 3.6 3.5 3.3* 3.3*  CL 99 102 103  103 102  CO2 25 29 26 24 21   GLUCOSE 400* 240* 196* 156* 271*  BUN 11 10 10 10 7   CREATININE 0.93 1.05 0.95 0.91 0.91  CALCIUM 9.3 9.1 9.1 9.1 8.3*   Liver Function Tests:  Recent Labs Lab 08/20/12 1205 08/22/12 0504  AST 26 35  ALT 60* 60*  ALKPHOS 128* 90  BILITOT 0.8 0.8  PROT 7.9 6.3  ALBUMIN 4.2 3.2*     CBC:  Recent Labs Lab 08/20/12 1204 08/21/12 0458  WBC 8.3 6.6  NEUTROABS 6.0  --   HGB 13.0 13.0  HCT 35.5* 36.4*  MCV 78.2 80.0  PLT 287 284   Cardiac Enzymes:  Recent Labs Lab 08/20/12 1205  TROPONINI <0.30   s)  CBG:  Recent Labs Lab 08/21/12 0722 08/21/12 1201 08/21/12 1722 08/21/12 2118 08/22/12 0733  GLUCAP 194* 289* 282* 216* 290*       Signed:  Toya Smothers M  [facility] 08/22/2012, 8:45 AM Attending: Patient seen and examined, but no review. I agree with patient's stability for discharge. His diabetes will have to be controlled as an outpatient now and he has already an appointment with his primary care physician soon.

## 2012-08-22 NOTE — Care Management Note (Signed)
    Page 1 of 1   08/22/2012     11:04:07 AM   CARE MANAGEMENT NOTE 08/22/2012  Patient:  Ricardo Rogers, Ricardo Rogers   Account Number:  [id number]  Date Initiated:  08/22/2012  Documentation initiated by:  Rosemary Holms  Subjective/Objective Assessment:   Pt admitted from home where he lives with his parents. Mother very involved with care. selected [facility] for RN to follow DM, order in shawdow chart for Acucheck and appt for followup set upfor Dr. Loleta Chance.     Action/Plan:   Anticipated DC Date:  08/22/2012   Anticipated DC Plan:  HOME W HOME HEALTH SERVICES      DC Planning Services  CM consult      Choice offered to / List presented to:          [facility] arranged  HH-1 RN  HH-10 DISEASE MANAGEMENT      HH agency  [facility]   Status of service:  Completed, signed off Medicare Important Message given?   (If response is "NO", the following Medicare IM given date fields will be blank) Date Medicare IM given:   Date Additional Medicare IM given:    Discharge Disposition:  HOME W HOME HEALTH SERVICES  Per UR Regulation:    If discussed at Long Length of Stay Meetings, dates discussed:    Comments:  08/22/12 Rosemary Holms RN BSN CM

## 2012-08-22 NOTE — Progress Notes (Signed)
Patient received discharge instructions along with follow up appointments and prescriptions. Patient verbalized understanding of all instructions. Patient was escorted by staff via wheelchair to vehicle. Patient discharged to home in stable condition. 

## 2012-08-22 NOTE — Progress Notes (Signed)
  RD consulted for nutrition education regarding diabetes.   Lab Results  Component Value Date   HGBA1C 12.2* 08/21/2012    RD provided "Carbohydrate Counting for People with Diabetes" handout from the Academy of Nutrition and Dietetics. Discussed different food groups and their effects on blood sugar, emphasizing carbohydrate-containing foods. Provided list of carbohydrates and recommended serving sizes of common foods.  Discussed importance of controlled and consistent carbohydrate intake throughout the day. Provided examples of ways to balance meals/snacks and encouraged intake of high-fiber, whole grain complex carbohydrates. Teach back method used.  Expect fair compliance.  Body mass index is 40.95 kg/(m^2). Pt meets criteria for Extreme Obesity Class IIIbased on current BMI.  Current diet order is CHO Modified , patient is consuming approximately 75-90% of meals at this time. Labs and medications reviewed. No further nutrition interventions warranted at this time. RD contact information provided. If additional nutrition issues arise, please re-consult RD.  Royann Shivers MS,RD,LDN,CSG Office: [phone] Pager: [phone]

## 2013-07-10 ENCOUNTER — Emergency Department (HOSPITAL_COMMUNITY)
Admission: EM | Admit: 2013-07-10 | Discharge: 2013-07-10 | Disposition: A | Discharge status: Home / Self Care | Payer: BC Managed Care – PPO | Attending: Emergency Medicine | Admitting: Emergency Medicine

## 2013-07-10 ENCOUNTER — Encounter (HOSPITAL_COMMUNITY): Payer: Self-pay | Admitting: Emergency Medicine

## 2013-07-10 DIAGNOSIS — R3589 Other polyuria: Secondary | ICD-10-CM | POA: Insufficient documentation

## 2013-07-10 DIAGNOSIS — R42 Dizziness and giddiness: Secondary | ICD-10-CM | POA: Insufficient documentation

## 2013-07-10 DIAGNOSIS — Z91199 Patient's noncompliance with other medical treatment and regimen due to unspecified reason: Secondary | ICD-10-CM | POA: Insufficient documentation

## 2013-07-10 DIAGNOSIS — Z9114 Patient's other noncompliance with medication regimen: Secondary | ICD-10-CM

## 2013-07-10 DIAGNOSIS — E119 Type 2 diabetes mellitus without complications: Secondary | ICD-10-CM | POA: Insufficient documentation

## 2013-07-10 DIAGNOSIS — Z9119 Patient's noncompliance with other medical treatment and regimen: Secondary | ICD-10-CM | POA: Insufficient documentation

## 2013-07-10 DIAGNOSIS — Z79899 Other long term (current) drug therapy: Secondary | ICD-10-CM | POA: Insufficient documentation

## 2013-07-10 DIAGNOSIS — I10 Essential (primary) hypertension: Secondary | ICD-10-CM | POA: Insufficient documentation

## 2013-07-10 DIAGNOSIS — R5381 Other malaise: Secondary | ICD-10-CM | POA: Insufficient documentation

## 2013-07-10 DIAGNOSIS — E1165 Type 2 diabetes mellitus with hyperglycemia: Secondary | ICD-10-CM

## 2013-07-10 DIAGNOSIS — R5383 Other fatigue: Secondary | ICD-10-CM

## 2013-07-10 DIAGNOSIS — R739 Hyperglycemia, unspecified: Secondary | ICD-10-CM

## 2013-07-10 DIAGNOSIS — R358 Other polyuria: Secondary | ICD-10-CM | POA: Insufficient documentation

## 2013-07-10 DIAGNOSIS — J45909 Unspecified asthma, uncomplicated: Secondary | ICD-10-CM | POA: Insufficient documentation

## 2013-07-10 HISTORY — DX: Type 2 diabetes mellitus without complications: E11.9

## 2013-07-10 LAB — POCT I-STAT, CHEM 8
BUN: 10 mg/dL (ref 6–23)
Calcium, Ion: 1.28 mmol/L — ABNORMAL HIGH (ref 1.12–1.23)
Chloride: 102 mEq/L (ref 96–112)
Creatinine, Ser: 1 mg/dL (ref 0.50–1.35)
Glucose, Bld: 338 mg/dL — ABNORMAL HIGH (ref 70–99)
HCT: 42 % (ref 39.0–52.0)
Hemoglobin: 14.3 g/dL (ref 13.0–17.0)
Potassium: 3.5 mEq/L — ABNORMAL LOW (ref 3.7–5.3)
Sodium: 140 mEq/L (ref 137–147)
TCO2: 27 mmol/L (ref 0–100)

## 2013-07-10 LAB — GLUCOSE, CAPILLARY: GLUCOSE-CAPILLARY: 358 mg/dL — AB (ref 70–99)

## 2013-07-10 MED ORDER — LISINOPRIL 5 MG PO TABS
5.0000 mg | ORAL_TABLET | Freq: Once | ORAL | Status: AC
  Administered 2013-07-10: 5 mg via ORAL
  Filled 2013-07-10: qty 1

## 2013-07-10 NOTE — Discharge Instructions (Signed)
Blood Glucose Monitoring, Adult °Monitoring your blood glucose (also know as blood sugar) helps you to manage your diabetes. It also helps you and your health care provider monitor your diabetes and determine how well your treatment plan is working. °WHY SHOULD YOU MONITOR YOUR BLOOD GLUCOSE? °· It can help you understand how food, exercise, and medicine affect your blood glucose. °· It allows you to know what your blood glucose is at any given moment. You can quickly tell if you are having low blood glucose (hypoglycemia) or high blood glucose (hyperglycemia). °· It can help you and your health care provider know how to adjust your medicines. °· It can help you understand how to manage an illness or adjust medicine for exercise. °WHEN SHOULD YOU TEST? °Your health care provider will help you decide how often you should check your blood glucose. This may depend on the type of diabetes you have, your diabetes control, or the types of medicines you are taking. Be sure to write down all of your blood glucose readings so that this information can be reviewed with your health care provider. See below for examples of testing times that your health care provider may suggest. °Type 1 Diabetes °· Test 4 times a day if you are in good control, using an insulin pump, or perform multiple daily injections. °· If your diabetes is not well-controlled or if you are sick, you may need to monitor more often. °· It is a good idea to also monitor: °· Before and after exercise. °· Between meals and 2 hours after a meal. °· Occasionally between 2:00 to 3:00 am. °Type 2 Diabetes °· It can vary with each person, but generally, if you are on insulin, test 4 times a day. °· If you take medicines by mouth (orally), test 2 times a day. °· If you are on a controlled diet, test once a day. °· If your diabetes is not well controlled or if you are sick, you may need to monitor more often. °HOW TO MONITOR YOUR BLOOD GLUCOSE °Supplies  Needed °· Blood glucose meter. °· Test strips for your meter. Each meter has its own strips. You must use the strips that go with your own meter. °· A pricking needle (lancet). °· A device that holds the lancet (lancing device). °· A journal or log book to write down your results. °Procedure °· Wash your hands with soap and water. Alcohol is not preferred. °· Prick the side of your finger (not the tip) with the lancet. °· Gently milk the finger until a small drop of blood appears. °· Follow the instructions that come with your meter for inserting the test strip, applying blood to the strip, and using your blood glucose meter. °Other Areas to Get Blood for Testing °Some meters allow you to use other areas of your body (other than your finger) to test your blood. These areas are called alternative sites. The most common alternative sites are: °· The forearm. °· The thigh. °· The back area of the lower leg. °· The palm of the hand. °The blood flow in these areas is slower. Therefore, the blood glucose values you get may be delayed, and the numbers are different from what you would get from your fingers. Do not use alternative sites if you think you are having hypoglycemia. Your reading will not be accurate. Always use a finger if you are having hypoglycemia. Also, if you cannot feel your lows (hypoglycemia unawareness), always use your fingers for your blood   glucose checks. °ADDITIONAL TIPS FOR GLUCOSE MONITORING °· Do not reuse lancets. °· Always carry your supplies with you. °· All blood glucose meters have a 24-hour "hotline" number to call if you have questions or need help. °· Adjust (calibrate) your blood glucose meter with a control solution after finishing a few boxes of strips. °BLOOD GLUCOSE RECORD KEEPING °It is a good idea to keep a daily record or log of your blood glucose readings. Most glucose meters, if not all, keep your glucose records stored in the meter. Some meters come with the ability to download  your records to your home computer. Keeping a record of your blood glucose readings is especially helpful if you are wanting to look for patterns. Make notes to go along with the blood glucose readings because you might forget what happened at that exact time. Keeping good records helps you and your health care provider to work together to achieve good diabetes management.  °Document Released: 05/24/2003 Document Revised: 01/21/2013 Document Reviewed: 10/13/2012 °[employer]® Patient Information ©2014 [employer]. ° °High Blood Sugar °High blood sugar (hyperglycemia) means that the level of sugar in your blood is higher than it should be. Signs of high blood sugar include: °· Feeling thirsty. °· Frequent peeing (urinating). °· Feeling tired or sleepy. °· Dry mouth. °· Vision changes. °· Feeling weak. °· Feeling hungry but losing weight. °· Numbness and tingling in your hands or feet. °· Headache. °When you ignore these signs, your blood sugar may keep going up. These problems may get worse, and other problems may begin. °HOME CARE °· Check your blood sugars as told by your doctor. Write down the numbers with the date and time. °· Take the right amount of insulin or diabetes pills at the right time. Write down the dose with date and time. °· Refill your insulin or diabetes pills before running out. °· Watch what you eat. Follow your meal plan. °· Drink liquids without sugar, such as water. Check with your doctor if you have kidney or heart disease. °· Follow your doctor's orders for exercise. Exercise at the same time of day. °· Keep your doctor's appointments. °GET HELP RIGHT AWAY IF:  °· You have trouble thinking or are confused. °· You have fast breathing with fruity smelling breath. °· You pass out (faint). °· You have 2 to 3 days of high blood sugars and you do not know why. °· You have chest pain. °· You are feeling sick to your stomach (nauseous) or throwing up (vomiting). °· You have sudden vision  changes. °MAKE SURE YOU:  °· Understand these instructions. °· Will watch your condition. °· Will get help right away if you are not doing well or get worse. °Document Released: 03/18/2009 Document Revised: 08/13/2011 Document Reviewed: 03/18/2009 °Exit[employer]® Patient Information ©2014 [employer]. ° °

## 2013-07-10 NOTE — ED Provider Notes (Signed)
CSN: [id number]     Arrival date & time 07/10/13  1201 History  This chart was scribed for Raeford Razor, MD by Ardelia Mems, ED Scribe. This patient was seen in room APA08/AP[facility]08 and the patient's care was started at 1:07 PM.   Chief Complaint  Patient presents with  . Hyperglycemia    The history is provided by the patient. No language interpreter was used.    HPI Comments: Ricardo Rogers is a 43 y.o. male with a history of DM and HTN who presents to the Emergency Department complaining of hyperglycemia today with a blood sugar at home noticed of 349 today. His CBG in the ED was 358. He reports that he typically checks his blood sugar once a week, but that he checks it more frequently when he does not feel well, and specifically when he has less motivation/energy. He reports that he has been having intermittent dizziness and polyuria over the past 1-2 days. He states that he is compliant with his prescribed Metformin and Glupizide, with the exception that he sometimes forgets 1 of his 2 daily doses of Metformin. He reports that his normal blood sugar is usually in the 170s. He also has a history of HTN, and reports that he hasn't been taking his prescribed Lisinopril. ED blood pressure is 197/122. He denies nausea or any other medications.    Past Medical History  Diagnosis Date  . Asthma   . HTN (hypertension)   . Diabetes mellitus without complication    History reviewed. No pertinent past surgical history. Family History  Problem Relation Age of Onset  . Cancer Other   . Diabetes Other   . Hypertension Other    History  Substance Use Topics  . Smoking status: Never Smoker   . Smokeless tobacco: Never Used  . Alcohol Use: No    Review of Systems  Constitutional: Positive for fatigue.  Gastrointestinal: Negative for nausea.  Endocrine: Positive for polyuria.  Neurological: Positive for dizziness.  All other systems reviewed and are negative.   Allergies  Review of  patient's allergies indicates no known allergies.  Home Medications   Current Outpatient Rx  Name  Route  Sig  Dispense  Refill  . albuterol (PROVENTIL HFA;VENTOLIN HFA) 108 (90 BASE) MCG/ACT inhaler   Inhalation   Inhale 2 puffs into the lungs every 6 (six) hours as needed for wheezing or shortness of breath.         Marland Kitchen glipiZIDE (GLUCOTROL) 10 MG tablet   Oral   Take 1 tablet (10 mg total) by mouth 2 (two) times daily before a meal.   60 tablet   0   . lisinopril (PRINIVIL,ZESTRIL) 5 MG tablet   Oral   Take 1 tablet (5 mg total) by mouth daily.   30 tablet   0   . metFORMIN (GLUCOPHAGE) 1000 MG tablet   Oral   Take 1 tablet (1,000 mg total) by mouth 2 (two) times daily with a meal.   60 tablet   0    Triage Vitals: BP 197/122  Pulse 100  Temp(Src) 98.3 F (36.8 C) (Oral)  Resp 18  Ht 5' (1.524 m)  Wt 210 lb (95.255 kg)  BMI 41.01 kg/m2  SpO2 97%  Physical Exam  Nursing note and vitals reviewed. Constitutional: He is oriented to person, place, and time. He appears well-developed and well-nourished.  HENT:  Head: Normocephalic and atraumatic.  Eyes: EOM are normal.  Neck: Normal range of motion.  Cardiovascular:  Normal rate, regular rhythm, normal heart sounds and intact distal pulses.   Pulmonary/Chest: Effort normal and breath sounds normal. No respiratory distress.  Abdominal: Soft. He exhibits no distension. There is no tenderness.  Musculoskeletal: Normal range of motion.  Neurological: He is alert and oriented to person, place, and time.  Skin: Skin is warm and dry.  Psychiatric: He has a normal mood and affect. Judgment normal.    ED Course  Procedures (including critical care time)  DIAGNOSTIC STUDIES: Oxygen Saturation is 97% on RA, normal by my interpretation.    COORDINATION OF CARE: 1:13 PM- Counseled pt at length on managing his HTN and DM. Pt advised of plan for treatment and pt agrees.  Labs Review Labs Reviewed  GLUCOSE, CAPILLARY -  Abnormal; Notable for the following:    Glucose-Capillary 358 (*)    All other components within normal limits  POCT I-STAT, CHEM 8 - Abnormal; Notable for the following:    Potassium 3.5 (*)    Glucose, Bld 338 (*)    Calcium, Ion 1.28 (*)    All other components within normal limits   Imaging Review No results found.  EKG Interpretation   None       MDM   1. Hyperglycemia   2. Poorly controlled diabetes mellitus   3. Noncompliance with medications    43 year old male with hyperglycemia. An anion gap. Bicarbonate normal. Likely secondary to poor medication compliance. Stressed importance to take his medicines properly including HTN meds. With degree of hypertension in ED may need higher titration or additional agent. Wont know unless pt consistently taking his meds though. Exam nonfocal. Outpt FU.    I personally preformed the services scribed in my presence. The recorded information has been reviewed is accurate. Raeford RazorStephen Teng Decou, MD.    Raeford RazorStephen Zyanya Glaza, MD 07/15/13 [phone]

## 2013-07-10 NOTE — ED Provider Notes (Deleted)
CSN: [id number]     Arrival date & time 07/10/13  1201 History   First MD Initiated Contact with Patient 07/10/13 1302     Chief Complaint  Patient presents with  . Hyperglycemia   (Consider location/radiation/quality/duration/timing/severity/associated sxs/prior Treatment) HPI  43yM with hyperglycemia. Checked glucose shortly before arrival and 340s. Normally not above 200 and this concerned him. Endorses polyuria and polydipsia. On metformin and glipizide. Typically takes metformin once a day with the glipizide. Is prescribed BID. Some days doesn't take either. Intermittently compliant with lisinopril as well. No fever or chills. Denies pain anywhere. No n/v. No weight loss. No visual changes.   Past Medical History  Diagnosis Date  . Asthma   . HTN (hypertension)   . Diabetes mellitus without complication    History reviewed. No pertinent past surgical history. Family History  Problem Relation Age of Onset  . Cancer Other   . Diabetes Other   . Hypertension Other    History  Substance Use Topics  . Smoking status: Never Smoker   . Smokeless tobacco: Never Used  . Alcohol Use: No    Review of Systems  All systems reviewed and negative, other than as noted in HPI.   Allergies  Review of patient's allergies indicates no known allergies.  Home Medications   Current Outpatient Rx  Name  Route  Sig  Dispense  Refill  . alum & mag hydroxide-simeth (MAALOX/MYLANTA) 200-200-20 MG/5ML suspension   Oral   Take 30 mLs by mouth every 6 (six) hours as needed.   355 mL      . glipiZIDE (GLUCOTROL) 10 MG tablet   Oral   Take 1 tablet (10 mg total) by mouth 2 (two) times daily before a meal.   60 tablet   0   . lisinopril (PRINIVIL,ZESTRIL) 5 MG tablet   Oral   Take 1 tablet (5 mg total) by mouth daily.   30 tablet   0   . metFORMIN (GLUCOPHAGE) 1000 MG tablet   Oral   Take 1 tablet (1,000 mg total) by mouth 2 (two) times daily with a meal.   60 tablet   0    BP  197/122  Pulse 100  Temp(Src) 98.3 F (36.8 C) (Oral)  Resp 18  Ht 5' (1.524 m)  Wt 210 lb (95.255 kg)  BMI 41.01 kg/m2  SpO2 97% Physical Exam  Nursing note and vitals reviewed. Constitutional: He appears well-developed and well-nourished. No distress.  HENT:  Head: Normocephalic and atraumatic.  Eyes: Conjunctivae are normal. Right eye exhibits no discharge. Left eye exhibits no discharge.  Neck: Neck supple.  Cardiovascular: Normal rate, regular rhythm and normal heart sounds.  Exam reveals no gallop and no friction rub.   No murmur heard. Pulmonary/Chest: Effort normal and breath sounds normal. No respiratory distress.  Abdominal: Soft. He exhibits no distension. There is no tenderness.  Musculoskeletal: He exhibits no edema and no tenderness.  Neurological: He is alert.  Skin: Skin is warm and dry.  Psychiatric: He has a normal mood and affect. His behavior is normal. Thought content normal.    ED Course  Procedures (including critical care time) Labs Review Labs Reviewed  GLUCOSE, CAPILLARY - Abnormal; Notable for the following:    Glucose-Capillary 358 (*)    All other components within normal limits   Imaging Review No results found.  EKG Interpretation   None       MDM   1. Hyperglycemia   2. Poorly controlled diabetes  mellitus   3. Noncompliance with medications     43 year old male with hyperglycemia. An anion gap. Bicarbonate normal. Likely secondary to poor medication compliance. Stressed importance to take his medicines properly including HTN meds. With degree of hypertension in ED may need higher titration or additional agent. Wont know unless pt consistently taking his meds though. Exam nonfocal. Outpt FU.     Raeford RazorStephen Etherine Mackowiak, MD 07/15/13 [phone]

## 2013-07-10 NOTE — ED Notes (Signed)
Patient reports increase in thirst and urination output. Patient reports checking blood sugar 1 hour ago-349. Per patient blood sugar usual 170s.patient blood pressure also 197/122 in triage. Patient reports missing doses of his blood pressure medication.

## 2013-12-19 ENCOUNTER — Emergency Department (HOSPITAL_COMMUNITY)
Admission: EM | Admit: 2013-12-19 | Discharge: 2013-12-19 | Disposition: A | Discharge status: Home / Self Care | Payer: BC Managed Care – PPO | Attending: Emergency Medicine | Admitting: Emergency Medicine

## 2013-12-19 ENCOUNTER — Encounter (HOSPITAL_COMMUNITY): Payer: Self-pay | Admitting: Emergency Medicine

## 2013-12-19 DIAGNOSIS — Z792 Long term (current) use of antibiotics: Secondary | ICD-10-CM | POA: Insufficient documentation

## 2013-12-19 DIAGNOSIS — E119 Type 2 diabetes mellitus without complications: Secondary | ICD-10-CM | POA: Insufficient documentation

## 2013-12-19 DIAGNOSIS — K002 Abnormalities of size and form of teeth: Secondary | ICD-10-CM | POA: Insufficient documentation

## 2013-12-19 DIAGNOSIS — K047 Periapical abscess without sinus: Secondary | ICD-10-CM | POA: Insufficient documentation

## 2013-12-19 DIAGNOSIS — I10 Essential (primary) hypertension: Secondary | ICD-10-CM | POA: Insufficient documentation

## 2013-12-19 DIAGNOSIS — J45909 Unspecified asthma, uncomplicated: Secondary | ICD-10-CM | POA: Insufficient documentation

## 2013-12-19 DIAGNOSIS — Z79899 Other long term (current) drug therapy: Secondary | ICD-10-CM | POA: Insufficient documentation

## 2013-12-19 MED ORDER — HYDROCODONE-ACETAMINOPHEN 5-325 MG PO TABS: 2.0000 | ORAL_TABLET | ORAL | Status: DC | PRN

## 2013-12-19 MED ORDER — HYDROCODONE-ACETAMINOPHEN 5-325 MG PO TABS: 1.0000 | ORAL_TABLET | ORAL | Status: DC | PRN

## 2013-12-19 MED ORDER — AMOXICILLIN 500 MG PO CAPS: 500.0000 mg | ORAL_CAPSULE | Freq: Three times a day (TID) | ORAL | Status: DC

## 2013-12-19 MED ORDER — AMOXICILLIN 250 MG PO CAPS
500.0000 mg | ORAL_CAPSULE | Freq: Once | ORAL | Status: AC
  Administered 2013-12-19: 500 mg via ORAL
  Filled 2013-12-19: qty 2

## 2013-12-19 NOTE — ED Notes (Signed)
Pt c/o dental pain to teeth on bilateral lower jaws. Says he is going to walk into a dentist on Monday.

## 2013-12-19 NOTE — ED Notes (Signed)
Bilateral lower jaw pain from 2 bad molars.  L side worse than R.

## 2013-12-19 NOTE — Discharge Instructions (Signed)
Abscessed Tooth °An abscessed tooth is an infection around your tooth. It may be caused by holes or damage to the tooth (cavity) or a dental disease. An abscessed tooth causes mild to very bad pain in and around the tooth. See your dentist right away if you have tooth or gum pain. °HOME CARE °· Take your medicine as told. Finish it even if you start to feel better. °· Do not drive after taking pain medicine. °· Rinse your mouth (gargle) often with salt water (¼ teaspoon salt in 8 ounces of warm water). °· Do not apply heat to the outside of your face. °GET HELP RIGHT AWAY IF:  °· You have a temperature by mouth above 102° F (38.9° C), not controlled by medicine. °· You have chills and a very bad headache. °· You have problems breathing or swallowing. °· Your mouth will not open. °· You develop puffiness (swelling) on the neck or around the eye. °· Your pain is not helped by medicine. °· Your pain is getting worse instead of better. °MAKE SURE YOU:  °· Understand these instructions. °· Will watch your condition. °· Will get help right away if you are not doing well or get worse. °Document Released: 11/07/2007 Document Revised: 08/13/2011 Document Reviewed: 08/29/2010 °[employer]® Patient Information ©2015 [employer]. This information is not intended to replace advice given to you by your health care provider. Make sure you discuss any questions you have with your health care provider. ° ° ° °Complete your entire course of antibiotics as prescribed.  You  may use the hydrocodone for pain relief but do not drive within 4 hours of taking as this will make you drowsy.  Avoid applying heat or ice to this abscess area which can worsen your symptoms.  You may use warm salt water swish and spit treatment or half peroxide and water swish and spit after meals to keep this area clean as discussed.  Call the dentist listed above for further management of your symptoms. ° ° °

## 2013-12-19 NOTE — ED Notes (Signed)
Patient with no complaints at this time. Respirations even and unlabored. Skin warm/dry. Discharge instructions reviewed with patient at this time. Patient given opportunity to voice concerns/ask questions. Patient discharged at this time and left Emergency Department with steady gait.   

## 2013-12-21 MED FILL — Hydrocodone-Acetaminophen Tab 5-325 MG: ORAL | Qty: 6 | Status: AC

## 2013-12-21 NOTE — ED Provider Notes (Signed)
CSN: [id number]     Arrival date & time 12/19/13  2018 History   First MD Initiated Contact with Patient 12/19/13 2043     No chief complaint on file.    (Consider location/radiation/quality/duration/timing/severity/associated sxs/prior Treatment) The history is provided by the patient.    Ricardo Rogers is a 43 y.o. male presenting with acute on chronic dental paindental pain and gingival swelling.   The patient has a history of decay in his bilateral lower 2nd molar teeth which has recently started to cause increased  Pain and now gingival swelling.  There has been no fevers, chills, nausea or vomiting, also no complaint of difficulty swallowing, although chewing makes pain worse.  The patient has tried nsaids without relief of symptoms.         Past Medical History  Diagnosis Date  . Asthma   . HTN (hypertension)   . Diabetes mellitus without complication    History reviewed. No pertinent past surgical history. Family History  Problem Relation Age of Onset  . Cancer Other   . Diabetes Other   . Hypertension Other    History  Substance Use Topics  . Smoking status: Never Smoker   . Smokeless tobacco: Never Used  . Alcohol Use: No    Review of Systems  Constitutional: Negative for fever.  HENT: Positive for dental problem. Negative for facial swelling and sore throat.   Respiratory: Negative for shortness of breath.   Musculoskeletal: Negative for neck pain and neck stiffness.      Allergies  Review of patient's allergies indicates no known allergies.  Home Medications   Prior to Admission medications   Medication Sig Start Date End Date Taking? Authorizing Provider  ADVAIR DISKUS 250-50 MCG/DOSE AEPB Inhale 1 puff into the lungs 2 (two) times daily. 11/03/13  Yes Historical Provider, MD  glipiZIDE (GLUCOTROL) 10 MG tablet Take 1 tablet (10 mg total) by mouth 2 (two) times daily before a meal. 08/22/12  Yes Lesle Chris Black, NP  lisinopril-hydrochlorothiazide  (PRINZIDE,ZESTORETIC) 20-12.5 MG per tablet Take 1 tablet by mouth 2 (two) times daily. 12/02/13  Yes Historical Provider, MD  metFORMIN (GLUCOPHAGE) 1000 MG tablet Take 1 tablet (1,000 mg total) by mouth 2 (two) times daily with a meal. 08/22/12  Yes Lesle Chris Black, NP  VIAGRA 100 MG tablet Take 100 mg by mouth daily as needed. Erectile dysfunction 12/02/13  Yes Historical Provider, MD  albuterol (PROVENTIL HFA;VENTOLIN HFA) 108 (90 BASE) MCG/ACT inhaler Inhale 2 puffs into the lungs every 6 (six) hours as needed for wheezing or shortness of breath.    Historical Provider, MD  amoxicillin (AMOXIL) 500 MG capsule Take 1 capsule (500 mg total) by mouth 3 (three) times daily. 12/19/13   Burgess Amor, PA-C  HYDROcodone-acetaminophen (NORCO/VICODIN) 5-325 MG per tablet Take 1 tablet by mouth every 4 (four) hours as needed. 12/19/13   Burgess Amor, PA-C  HYDROcodone-acetaminophen (NORCO/VICODIN) 5-325 MG per tablet Take 2 tablets by mouth every 4 (four) hours as needed for moderate pain or severe pain. 12/19/13   Burgess Amor, PA-C   BP 143/92  Pulse 92  Temp(Src) 98.4 F (36.9 C) (Oral)  Resp 18  Ht 5\' 1"  (1.549 m)  Wt 216 lb (97.977 kg)  BMI 40.83 kg/m2  SpO2 96% Physical Exam  Constitutional: He is oriented to person, place, and time. He appears well-developed and well-nourished. No distress.  HENT:  Head: Normocephalic and atraumatic.  Right Ear: Tympanic membrane and external ear normal.  Left  Ear: Tympanic membrane and external ear normal.  Mouth/Throat: Oropharynx is clear and moist and mucous membranes are normal. No oral lesions. No trismus in the jaw. Abnormal dentition. Dental caries present. No dental abscesses.    Deep cavities bilateral lower molars.  Diffuse dental decay.  Gingival edema without fluctuance.  Eyes: Conjunctivae are normal.  Neck: Normal range of motion. Neck supple.  Cardiovascular: Normal rate and normal heart sounds.   Pulmonary/Chest: Effort normal.  Abdominal: He  exhibits no distension.  Musculoskeletal: Normal range of motion.  Lymphadenopathy:    He has no cervical adenopathy.  Neurological: He is alert and oriented to person, place, and time.  Skin: Skin is warm and dry. No erythema.  Psychiatric: He has a normal mood and affect.    ED Course  Procedures (including critical care time) Labs Review Labs Reviewed - No data to display  Imaging Review No results found.   EKG Interpretation None      MDM   Final diagnoses:  Dental abscess    Amoxil, hydrocodone.  F/u with dentist.  Referrals given.    The patient appears reasonably screened and/or stabilized for discharge and I doubt any other medical condition or other [facility] requiring further screening, evaluation, or treatment in the ED at this time prior to discharge.     Burgess AmorJulie Adeleine Pask, PA-C 12/21/13 [phone]

## 2013-12-23 NOTE — ED Provider Notes (Signed)
Medical screening examination/treatment/procedure(s) were performed by non-physician practitioner and as supervising physician I was immediately available for consultation/collaboration.   EKG Interpretation None        Gilda Creasehristopher J. Elisabetta Mishra, MD 12/23/13 1102

## 2014-03-06 DIAGNOSIS — J45909 Unspecified asthma, uncomplicated: Secondary | ICD-10-CM | POA: Diagnosis not present

## 2014-03-06 DIAGNOSIS — K047 Periapical abscess without sinus: Secondary | ICD-10-CM | POA: Insufficient documentation

## 2014-03-06 DIAGNOSIS — I10 Essential (primary) hypertension: Secondary | ICD-10-CM | POA: Diagnosis not present

## 2014-03-06 DIAGNOSIS — K088 Other specified disorders of teeth and supporting structures: Secondary | ICD-10-CM | POA: Diagnosis present

## 2014-03-06 DIAGNOSIS — Z79899 Other long term (current) drug therapy: Secondary | ICD-10-CM | POA: Diagnosis not present

## 2014-03-06 DIAGNOSIS — E119 Type 2 diabetes mellitus without complications: Secondary | ICD-10-CM | POA: Diagnosis not present

## 2014-03-07 ENCOUNTER — Encounter (HOSPITAL_COMMUNITY): Payer: Self-pay | Admitting: Emergency Medicine

## 2014-03-07 ENCOUNTER — Emergency Department (HOSPITAL_COMMUNITY)
Admission: EM | Admit: 2014-03-07 | Discharge: 2014-03-07 | Disposition: A | Discharge status: Home / Self Care | Payer: BC Managed Care – PPO | Attending: Emergency Medicine | Admitting: Emergency Medicine

## 2014-03-07 DIAGNOSIS — K047 Periapical abscess without sinus: Secondary | ICD-10-CM

## 2014-03-07 MED ORDER — TRAMADOL HCL 50 MG PO TABS: 50.0000 mg | ORAL_TABLET | Freq: Four times a day (QID) | ORAL | Status: DC | PRN

## 2014-03-07 MED ORDER — AMOXICILLIN 500 MG PO CAPS: 500.0000 mg | ORAL_CAPSULE | Freq: Three times a day (TID) | ORAL | Status: DC

## 2014-03-07 MED ORDER — OXYCODONE-ACETAMINOPHEN 5-325 MG PO TABS
1.0000 | ORAL_TABLET | Freq: Once | ORAL | Status: AC
  Administered 2014-03-07: 1 via ORAL
  Filled 2014-03-07: qty 1

## 2014-03-07 MED ORDER — NAPROXEN 500 MG PO TABS
500.0000 mg | ORAL_TABLET | Freq: Two times a day (BID) | ORAL | Status: DC
Start: 2014-03-07 — End: 2014-10-03

## 2014-03-07 MED ORDER — AMOXICILLIN 250 MG PO CAPS
500.0000 mg | ORAL_CAPSULE | Freq: Once | ORAL | Status: AC
  Administered 2014-03-07: 500 mg via ORAL
  Filled 2014-03-07: qty 2

## 2014-03-07 NOTE — Discharge Instructions (Signed)
Follow up with your dentist as soon as possible.

## 2014-03-07 NOTE — ED Notes (Signed)
Pt states that he has pain in his lower/back right tooth. Pt states that the pain has been going on for about a week and came to the ED because he can't tolerate the pain anymore. Pt states he took tylenol with some relief.

## 2014-03-07 NOTE — ED Provider Notes (Signed)
Medical screening examination/treatment/procedure(s) were performed by non-physician practitioner and as supervising physician I was immediately available for consultation/collaboration.   EKG Interpretation None        Joya Gaskinsonald W Joachim Carton, MD 03/07/14 [phone]

## 2014-03-07 NOTE — ED Provider Notes (Signed)
CSN: [id number]     Arrival date & time 03/06/14  2355 History   First MD Initiated Contact with Patient 03/06/14 2359     Chief Complaint  Patient presents with  . Dental Pain     (Consider location/radiation/quality/duration/timing/severity/associated sxs/prior Treatment) Patient is a 43 y.o. male presenting with tooth pain. The history is provided by the patient.  Dental Pain Location:  Lower Lower teeth location:  32/RL 3rd molar and 31/RL 2nd molar Quality:  Throbbing Severity:  Severe Onset quality:  Gradual Duration:  3 days Timing:  Constant Progression:  Worsening Chronicity:  New Context: abscess and dental caries   Relieved by:  Nothing Worsened by:  Cold food/drink Ineffective treatments:  NSAIDs Associated symptoms: facial pain and facial swelling   Associated symptoms: no fever     Past Medical History  Diagnosis Date  . Asthma   . HTN (hypertension)   . Diabetes mellitus without complication    No past surgical history on file. Family History  Problem Relation Age of Onset  . Cancer Other   . Diabetes Other   . Hypertension Other    History  Substance Use Topics  . Smoking status: Never Smoker   . Smokeless tobacco: Never Used  . Alcohol Use: No    Review of Systems  Constitutional: Negative for fever and chills.  HENT: Positive for dental problem and facial swelling.   All other systems negative    Allergies  Review of patient's allergies indicates no known allergies.  Home Medications   Prior to Admission medications   Medication Sig Start Date End Date Taking? Authorizing Provider  ADVAIR DISKUS 250-50 MCG/DOSE AEPB Inhale 1 puff into the lungs 2 (two) times daily. 11/03/13   Historical Provider, MD  albuterol (PROVENTIL HFA;VENTOLIN HFA) 108 (90 BASE) MCG/ACT inhaler Inhale 2 puffs into the lungs every 6 (six) hours as needed for wheezing or shortness of breath.    Historical Provider, MD  amoxicillin (AMOXIL) 500 MG capsule Take 1  capsule (500 mg total) by mouth 3 (three) times daily. 12/19/13   Burgess AmorJulie Idol, PA-C  glipiZIDE (GLUCOTROL) 10 MG tablet Take 1 tablet (10 mg total) by mouth 2 (two) times daily before a meal. 08/22/12   Gwenyth BenderKaren M Black, NP  HYDROcodone-acetaminophen (NORCO/VICODIN) 5-325 MG per tablet Take 1 tablet by mouth every 4 (four) hours as needed. 12/19/13   Burgess AmorJulie Idol, PA-C  HYDROcodone-acetaminophen (NORCO/VICODIN) 5-325 MG per tablet Take 2 tablets by mouth every 4 (four) hours as needed for moderate pain or severe pain. 12/19/13   Burgess AmorJulie Idol, PA-C  lisinopril-hydrochlorothiazide (PRINZIDE,ZESTORETIC) 20-12.5 MG per tablet Take 1 tablet by mouth 2 (two) times daily. 12/02/13   Historical Provider, MD  metFORMIN (GLUCOPHAGE) 1000 MG tablet Take 1 tablet (1,000 mg total) by mouth 2 (two) times daily with a meal. 08/22/12   Gwenyth BenderKaren M Black, NP  VIAGRA 100 MG tablet Take 100 mg by mouth daily as needed. Erectile dysfunction 12/02/13   Historical Provider, MD   BP 145/85  Pulse 94  Temp(Src) 98.2 F (36.8 C) (Oral)  Resp 18  Ht 5\' 2"  (1.575 m)  Wt 210 lb (95.255 kg)  BMI 38.40 kg/m2  SpO2 98%  Physical Exam  Nursing note and vitals reviewed. Constitutional: He is oriented to person, place, and time. He appears well-developed and well-nourished.  HENT:  Head: Normocephalic.  Mouth/Throat: Uvula is midline, oropharynx is clear and moist and mucous membranes are normal. Dental abscesses present.    Eyes: EOM  are normal.  Neck: Neck supple.  Cardiovascular: Normal rate.   Pulmonary/Chest: Effort normal.  Musculoskeletal: Normal range of motion.  Lymphadenopathy:    He has cervical adenopathy (right).  Neurological: He is alert and oriented to person, place, and time. No cranial nerve deficit.  Skin: Skin is warm and dry.  Psychiatric: He has a normal mood and affect. His behavior is normal.    ED Course  Procedures   MDM  43 y.o. male with dental pain due to abscess. Stable for discharge without fever or  signs of sepsis. He will follow up with his dentist as soon as possible. He will return here as needed for problems.  Discussed with the patient and all questioned fully answered.    Medication List    TAKE these medications       naproxen 500 MG tablet  Commonly known as:  NAPROSYN  Take 1 tablet (500 mg total) by mouth 2 (two) times daily.     traMADol 50 MG tablet  Commonly known as:  ULTRAM  Take 1 tablet (50 mg total) by mouth every 6 (six) hours as needed.      ASK your doctor about these medications       ADVAIR DISKUS 250-50 MCG/DOSE Aepb  Generic drug:  Fluticasone-Salmeterol  Inhale 1 puff into the lungs 2 (two) times daily.     albuterol 108 (90 BASE) MCG/ACT inhaler  Commonly known as:  PROVENTIL HFA;VENTOLIN HFA  Inhale 2 puffs into the lungs every 6 (six) hours as needed for wheezing or shortness of breath.     amoxicillin 500 MG capsule  Commonly known as:  AMOXIL  Take 1 capsule (500 mg total) by mouth 3 (three) times daily.  Ask about: Which instructions should I use?     amoxicillin 500 MG capsule  Commonly known as:  AMOXIL  Take 1 capsule (500 mg total) by mouth 3 (three) times daily.  Ask about: Which instructions should I use?     glipiZIDE 10 MG tablet  Commonly known as:  GLUCOTROL  Take 1 tablet (10 mg total) by mouth 2 (two) times daily before a meal.     HYDROcodone-acetaminophen 5-325 MG per tablet  Commonly known as:  NORCO/VICODIN  Take 1 tablet by mouth every 4 (four) hours as needed.     HYDROcodone-acetaminophen 5-325 MG per tablet  Commonly known as:  NORCO/VICODIN  Take 2 tablets by mouth every 4 (four) hours as needed for moderate pain or severe pain.     lisinopril-hydrochlorothiazide 20-12.5 MG per tablet  Commonly known as:  PRINZIDE,ZESTORETIC  Take 1 tablet by mouth 2 (two) times daily.     metFORMIN 1000 MG tablet  Commonly known as:  GLUCOPHAGE  Take 1 tablet (1,000 mg total) by mouth 2 (two) times daily with a meal.       VIAGRA 100 MG tablet  Generic drug:  sildenafil  Take 100 mg by mouth daily as needed. Erectile dysfunction            Janne Napoleon, NP 03/07/14 [phone]

## 2014-04-22 ENCOUNTER — Encounter (HOSPITAL_COMMUNITY): Payer: Self-pay

## 2014-04-22 ENCOUNTER — Emergency Department (HOSPITAL_COMMUNITY)
Admission: EM | Admit: 2014-04-22 | Discharge: 2014-04-22 | Disposition: A | Discharge status: Home / Self Care | Payer: BC Managed Care – PPO | Attending: Emergency Medicine | Admitting: Emergency Medicine

## 2014-04-22 DIAGNOSIS — E119 Type 2 diabetes mellitus without complications: Secondary | ICD-10-CM | POA: Diagnosis not present

## 2014-04-22 DIAGNOSIS — Z792 Long term (current) use of antibiotics: Secondary | ICD-10-CM | POA: Diagnosis not present

## 2014-04-22 DIAGNOSIS — Z760 Encounter for issue of repeat prescription: Secondary | ICD-10-CM | POA: Insufficient documentation

## 2014-04-22 DIAGNOSIS — Z79899 Other long term (current) drug therapy: Secondary | ICD-10-CM | POA: Diagnosis not present

## 2014-04-22 DIAGNOSIS — I10 Essential (primary) hypertension: Secondary | ICD-10-CM | POA: Diagnosis not present

## 2014-04-22 DIAGNOSIS — J45909 Unspecified asthma, uncomplicated: Secondary | ICD-10-CM | POA: Insufficient documentation

## 2014-04-22 DIAGNOSIS — Z791 Long term (current) use of non-steroidal anti-inflammatories (NSAID): Secondary | ICD-10-CM | POA: Diagnosis not present

## 2014-04-22 LAB — CBG MONITORING, ED: GLUCOSE-CAPILLARY: 117 mg/dL — AB (ref 70–99)

## 2014-04-22 MED ORDER — GLIPIZIDE 10 MG PO TABS: 10.0000 mg | ORAL_TABLET | Freq: Two times a day (BID) | ORAL | Status: DC

## 2014-04-22 MED ORDER — METFORMIN HCL 1000 MG PO TABS: 1000.0000 mg | ORAL_TABLET | Freq: Two times a day (BID) | ORAL | Status: DC

## 2014-04-22 MED ORDER — LISINOPRIL-HYDROCHLOROTHIAZIDE 20-12.5 MG PO TABS: 1.0000 | ORAL_TABLET | Freq: Two times a day (BID) | ORAL | Status: DC

## 2014-04-22 NOTE — ED Notes (Signed)
Pt states he has been having a headache for a couple of days and is wondering if his blood sugar is running high.  Pt admits to running out of his diabetes meds for several days.

## 2014-04-22 NOTE — ED Provider Notes (Signed)
CSN: [id number]     Arrival date & time 04/22/14  [id number] History   First MD Initiated Contact with Patient 04/22/14 [phone]     Chief Complaint  Patient presents with  . Headache     (Consider location/radiation/quality/duration/timing/severity/associated sxs/prior Treatment) HPI  This is a 43 year old male with diabetes and hypertension. He took his last dose of metformin, glipizide and Zestoretic yesterday evening before work. He is now out and is requesting refills. He had a headache earlier but none now. He has no acute complaint at the present time. His sugar was noted to be 117 on arrival.  Past Medical History  Diagnosis Date  . Asthma   . HTN (hypertension)   . Diabetes mellitus without complication    History reviewed. No pertinent past surgical history. Family History  Problem Relation Age of Onset  . Cancer Other   . Diabetes Other   . Hypertension Other    History  Substance Use Topics  . Smoking status: Never Smoker   . Smokeless tobacco: Never Used  . Alcohol Use: No    Review of Systems  All other systems reviewed and are negative.   Allergies  Review of patient's allergies indicates no known allergies.  Home Medications   Prior to Admission medications   Medication Sig Start Date End Date Taking? Authorizing Provider  ADVAIR DISKUS 250-50 MCG/DOSE AEPB Inhale 1 puff into the lungs 2 (two) times daily. 11/03/13   Historical Provider, MD  albuterol (PROVENTIL HFA;VENTOLIN HFA) 108 (90 BASE) MCG/ACT inhaler Inhale 2 puffs into the lungs every 6 (six) hours as needed for wheezing or shortness of breath.    Historical Provider, MD  amoxicillin (AMOXIL) 500 MG capsule Take 1 capsule (500 mg total) by mouth 3 (three) times daily. 12/19/13   Burgess AmorJulie Idol, PA-C  amoxicillin (AMOXIL) 500 MG capsule Take 1 capsule (500 mg total) by mouth 3 (three) times daily. 03/07/14   Hope Orlene OchM Neese, NP  glipiZIDE (GLUCOTROL) 10 MG tablet Take 1 tablet (10 mg total) by mouth 2 (two) times  daily before a meal. 08/22/12   Gwenyth BenderKaren M Black, NP  HYDROcodone-acetaminophen (NORCO/VICODIN) 5-325 MG per tablet Take 1 tablet by mouth every 4 (four) hours as needed. 12/19/13   Burgess AmorJulie Idol, PA-C  HYDROcodone-acetaminophen (NORCO/VICODIN) 5-325 MG per tablet Take 2 tablets by mouth every 4 (four) hours as needed for moderate pain or severe pain. 12/19/13   Burgess AmorJulie Idol, PA-C  lisinopril-hydrochlorothiazide (PRINZIDE,ZESTORETIC) 20-12.5 MG per tablet Take 1 tablet by mouth 2 (two) times daily. 12/02/13   Historical Provider, MD  metFORMIN (GLUCOPHAGE) 1000 MG tablet Take 1 tablet (1,000 mg total) by mouth 2 (two) times daily with a meal. 08/22/12   Gwenyth BenderKaren M Black, NP  naproxen (NAPROSYN) 500 MG tablet Take 1 tablet (500 mg total) by mouth 2 (two) times daily. 03/07/14   Hope Orlene OchM Neese, NP  traMADol (ULTRAM) 50 MG tablet Take 1 tablet (50 mg total) by mouth every 6 (six) hours as needed. 03/07/14   Hope Orlene OchM Neese, NP  VIAGRA 100 MG tablet Take 100 mg by mouth daily as needed. Erectile dysfunction 12/02/13   Historical Provider, MD   BP 139/90 mmHg  Pulse 106  Temp(Src) 98.2 F (36.8 C) (Oral)  Resp 18  Ht 5' (1.524 m)  Wt 220 lb (99.791 kg)  BMI 42.97 kg/m2  SpO2 97%   Physical Exam  General: Well-developed, well-nourished male in no acute distress; appearance consistent with age of record HENT: normocephalic; atraumatic Eyes:  pupils equal, round and reactive to light; extraocular muscles intact Neck: supple Heart: regular rate and rhythm Lungs: clear to auscultation bilaterally Abdomen: soft; nondistended; nontender; bowel sounds present Extremities: No deformity; full range of motion; pulses normal Neurologic: Awake, alert and oriented; motor function intact in all extremities and symmetric; no facial droop Skin: Warm and dry Psychiatric: Normal mood and affect    ED Course  Procedures (including critical care time)   MDM   Nursing notes and vitals signs, including pulse oximetry,  reviewed.  Summary of this visit's results, reviewed by myself:  Labs:  Results for orders placed or performed during the hospital encounter of 04/22/14 (from the past 24 hour(s))  CBG monitoring, ED     Status: Abnormal   Collection Time: 04/22/14  5:39 AM  Result Value Ref Range   Glucose-Capillary 117 (H) 70 - 99 mg/dL      Hanley SeamenJohn L Sidra Oldfield, MD 04/22/14 [phone]

## 2014-04-22 NOTE — Discharge Instructions (Signed)
Medication Refill, Emergency Department °We have refilled your medication today as a courtesy to you. It is best for your medical care, however, to take care of getting refills done through your primary caregiver's office. They have your records and can do a better job of follow-up than we can in the emergency department. °On maintenance medications, we often only prescribe enough medications to get you by until you are able to see your regular caregiver. This is a more expensive way to refill medications. °In the future, please plan for refills so that you will not have to use the emergency department for this. °Thank you for your help. Your help allows us to better take care of the daily emergencies that enter our department. °Document Released: 09/07/2003 Document Revised: 08/13/2011 Document Reviewed: 08/28/2013 °ExitCare® Patient Information ©2015 [employer]. This information is not intended to replace advice given to you by your health care provider. Make sure you discuss any questions you have with your health care provider. ° °

## 2014-07-03 ENCOUNTER — Encounter (HOSPITAL_COMMUNITY): Payer: Self-pay | Admitting: Emergency Medicine

## 2014-07-03 ENCOUNTER — Emergency Department (HOSPITAL_COMMUNITY)
Admission: EM | Admit: 2014-07-03 | Discharge: 2014-07-03 | Disposition: A | Discharge status: Home / Self Care | Payer: BLUE CROSS/BLUE SHIELD | Attending: Emergency Medicine | Admitting: Emergency Medicine

## 2014-07-03 DIAGNOSIS — E1165 Type 2 diabetes mellitus with hyperglycemia: Secondary | ICD-10-CM | POA: Diagnosis not present

## 2014-07-03 DIAGNOSIS — J45909 Unspecified asthma, uncomplicated: Secondary | ICD-10-CM | POA: Diagnosis not present

## 2014-07-03 DIAGNOSIS — Z79899 Other long term (current) drug therapy: Secondary | ICD-10-CM | POA: Insufficient documentation

## 2014-07-03 DIAGNOSIS — I10 Essential (primary) hypertension: Secondary | ICD-10-CM | POA: Insufficient documentation

## 2014-07-03 DIAGNOSIS — R739 Hyperglycemia, unspecified: Secondary | ICD-10-CM

## 2014-07-03 DIAGNOSIS — R531 Weakness: Secondary | ICD-10-CM | POA: Diagnosis present

## 2014-07-03 LAB — URINALYSIS, ROUTINE W REFLEX MICROSCOPIC
BILIRUBIN URINE: NEGATIVE
Glucose, UA: NEGATIVE mg/dL
Ketones, ur: NEGATIVE mg/dL
LEUKOCYTES UA: NEGATIVE
NITRITE: NEGATIVE
PH: 5.5 (ref 5.0–8.0)
PROTEIN: 100 mg/dL — AB
Specific Gravity, Urine: 1.025 (ref 1.005–1.030)
Urobilinogen, UA: 0.2 mg/dL (ref 0.0–1.0)

## 2014-07-03 LAB — CBC WITH DIFFERENTIAL/PLATELET
Basophils Absolute: 0 10*3/uL (ref 0.0–0.1)
Basophils Relative: 0 % (ref 0–1)
EOS ABS: 0.1 10*3/uL (ref 0.0–0.7)
EOS PCT: 1 % (ref 0–5)
HEMATOCRIT: 37.5 % — AB (ref 39.0–52.0)
Hemoglobin: 12.9 g/dL — ABNORMAL LOW (ref 13.0–17.0)
LYMPHS PCT: 31 % (ref 12–46)
Lymphs Abs: 2.2 10*3/uL (ref 0.7–4.0)
MCH: 28.2 pg (ref 26.0–34.0)
MCHC: 34.4 g/dL (ref 30.0–36.0)
MCV: 81.9 fL (ref 78.0–100.0)
MONO ABS: 0.4 10*3/uL (ref 0.1–1.0)
Monocytes Relative: 5 % (ref 3–12)
Neutro Abs: 4.3 10*3/uL (ref 1.7–7.7)
Neutrophils Relative %: 63 % (ref 43–77)
Platelets: 299 10*3/uL (ref 150–400)
RBC: 4.58 MIL/uL (ref 4.22–5.81)
RDW: 14.4 % (ref 11.5–15.5)
WBC: 6.9 10*3/uL (ref 4.0–10.5)

## 2014-07-03 LAB — BASIC METABOLIC PANEL
ANION GAP: 8 (ref 5–15)
BUN: 15 mg/dL (ref 6–23)
CHLORIDE: 104 mmol/L (ref 96–112)
CO2: 26 mmol/L (ref 19–32)
Calcium: 9.2 mg/dL (ref 8.4–10.5)
Creatinine, Ser: 1.12 mg/dL (ref 0.50–1.35)
GFR, EST NON AFRICAN AMERICAN: 78 mL/min — AB (ref >90)
GLUCOSE: 166 mg/dL — AB (ref 70–99)
Potassium: 3.3 mmol/L — ABNORMAL LOW (ref 3.5–5.1)
Sodium: 138 mmol/L (ref 135–145)

## 2014-07-03 LAB — URINE MICROSCOPIC-ADD ON

## 2014-07-03 LAB — CBG MONITORING, ED: Glucose-Capillary: 210 mg/dL — ABNORMAL HIGH (ref 70–99)

## 2014-07-03 MED ORDER — LISINOPRIL-HYDROCHLOROTHIAZIDE 20-12.5 MG PO TABS: 1.0000 | ORAL_TABLET | Freq: Two times a day (BID) | ORAL | Status: DC

## 2014-07-03 MED ORDER — GLIPIZIDE 10 MG PO TABS: 10.0000 mg | ORAL_TABLET | Freq: Two times a day (BID) | ORAL | Status: DC

## 2014-07-03 MED ORDER — METFORMIN HCL 1000 MG PO TABS: 1000.0000 mg | ORAL_TABLET | Freq: Two times a day (BID) | ORAL | Status: DC

## 2014-07-03 NOTE — ED Notes (Signed)
Pt. Reports he is out of his diabetic medications. Pt. Requesting refill.

## 2014-07-03 NOTE — Discharge Instructions (Signed)
Hyperglycemia Take your medications as prescribed. Follow up with your doctor. Return to the ED if you develop new or worsening symptoms. Hyperglycemia occurs when the glucose (sugar) in your blood is too high. Hyperglycemia can happen for many reasons, but it most often happens to people who do not know they have diabetes or are not managing their diabetes properly.  CAUSES  Whether you have diabetes or not, there are other causes of hyperglycemia. Hyperglycemia can occur when you have diabetes, but it can also occur in other situations that you might not be as aware of, such as: Diabetes  If you have diabetes and are having problems controlling your blood glucose, hyperglycemia could occur because of some of the following reasons:  Not following your meal plan.  Not taking your diabetes medications or not taking it properly.  Exercising less or doing less activity than you normally do.  Being sick. Pre-diabetes  This cannot be ignored. Before people develop Type 2 diabetes, they almost always have "pre-diabetes." This is when your blood glucose levels are higher than normal, but not yet high enough to be diagnosed as diabetes. Research has shown that some long-term damage to the body, especially the heart and circulatory system, may already be occurring during pre-diabetes. If you take action to manage your blood glucose when you have pre-diabetes, you may delay or prevent Type 2 diabetes from developing. Stress  If you have diabetes, you may be "diet" controlled or on oral medications or insulin to control your diabetes. However, you may find that your blood glucose is higher than usual in the hospital whether you have diabetes or not. This is often referred to as "stress hyperglycemia." Stress can elevate your blood glucose. This happens because of hormones put out by the body during times of stress. If stress has been the cause of your high blood glucose, it can be followed regularly by  your caregiver. That way he/she can make sure your hyperglycemia does not continue to get worse or progress to diabetes. Steroids  Steroids are medications that act on the infection fighting system (immune system) to block inflammation or infection. One side effect can be a rise in blood glucose. Most people can produce enough extra insulin to allow for this rise, but for those who cannot, steroids make blood glucose levels go even higher. It is not unusual for steroid treatments to "uncover" diabetes that is developing. It is not always possible to determine if the hyperglycemia will go away after the steroids are stopped. A special blood test called an A1c is sometimes done to determine if your blood glucose was elevated before the steroids were started. SYMPTOMS  Thirsty.  Frequent urination.  Dry mouth.  Blurred vision.  Tired or fatigue.  Weakness.  Sleepy.  Tingling in feet or leg. DIAGNOSIS  Diagnosis is made by monitoring blood glucose in one or all of the following ways:  A1c test. This is a chemical found in your blood.  Fingerstick blood glucose monitoring.  Laboratory results. TREATMENT  First, knowing the cause of the hyperglycemia is important before the hyperglycemia can be treated. Treatment may include, but is not be limited to:  Education.  Change or adjustment in medications.  Change or adjustment in meal plan.  Treatment for an illness, infection, etc.  More frequent blood glucose monitoring.  Change in exercise plan.  Decreasing or stopping steroids.  Lifestyle changes. HOME CARE INSTRUCTIONS   Test your blood glucose as directed.  Exercise regularly. Your caregiver  will give you instructions about exercise. Pre-diabetes or diabetes which comes on with stress is helped by exercising.  Eat wholesome, balanced meals. Eat often and at regular, fixed times. Your caregiver or nutritionist will give you a meal plan to guide your sugar  intake.  Being at an ideal weight is important. If needed, losing as little as 10 to 15 pounds may help improve blood glucose levels. SEEK MEDICAL CARE IF:   You have questions about medicine, activity, or diet.  You continue to have symptoms (problems such as increased thirst, urination, or weight gain). SEEK IMMEDIATE MEDICAL CARE IF:   You are vomiting or have diarrhea.  Your breath smells fruity.  You are breathing faster or slower.  You are very sleepy or incoherent.  You have numbness, tingling, or pain in your feet or hands.  You have chest pain.  Your symptoms get worse even though you have been following your caregiver's orders.  If you have any other questions or concerns. Document Released: 11/14/2000 Document Revised: 08/13/2011 Document Reviewed: 09/17/2011 [facility] Patient Information 2015 [place], [place]. This information is not intended to replace advice given to you by your health care provider. Make sure you discuss any questions you have with your health care provider.

## 2014-07-03 NOTE — ED Notes (Signed)
cbg in triage 210.  

## 2014-07-03 NOTE — ED Notes (Signed)
Pt reports is out of metformin and bp medication x2 days. Pt reports generalized weakness.

## 2014-07-03 NOTE — ED Provider Notes (Signed)
CSN: [id number]     Arrival date & time 07/03/14  1853 History  This chart was scribed for Glynn Octave, MD by Modena Jansky, ED Scribe. This patient was seen in room APA05/APA05 and the patient's care was started at 9:21 PM.    Chief Complaint  Patient presents with  . Weakness   The history is provided by the patient. No language interpreter was used.    HPI Comments: Ricardo Rogers is a 44 y.o. male with a hx of DM who presents to the Emergency Department complaining of constant moderate generalized weakness that started today. He reports that he has been out of his glipizide and metformin for 2 days. He states that he checked his blood sugar today and it was 210, and 140 is normal for him. He reports one episode of vomiting. He states that he has a hx of HTN and asthma. He denies any abdominal pain, chest pain, fever, dizziness, lightheadedness, headache, visual disturbance, or weakness in extremities.   Past Medical History  Diagnosis Date  . Asthma   . HTN (hypertension)   . Diabetes mellitus without complication    History reviewed. No pertinent past surgical history. Family History  Problem Relation Age of Onset  . Cancer Other   . Diabetes Other   . Hypertension Other    History  Substance Use Topics  . Smoking status: Never Smoker   . Smokeless tobacco: Never Used  . Alcohol Use: No    Review of Systems A complete 10 system review of systems was obtained and all systems are negative except as noted in the HPI and PMH.   Allergies  Review of patient's allergies indicates no known allergies.  Home Medications   Prior to Admission medications   Medication Sig Start Date End Date Taking? Authorizing Provider  ADVAIR DISKUS 250-50 MCG/DOSE AEPB Inhale 1 puff into the lungs 2 (two) times daily as needed.  11/03/13   Historical Provider, MD  amoxicillin (AMOXIL) 500 MG capsule Take 1 capsule (500 mg total) by mouth 3 (three) times daily. Patient not taking: Reported on  07/03/2014 12/19/13   Burgess Amor, PA-C  amoxicillin (AMOXIL) 500 MG capsule Take 1 capsule (500 mg total) by mouth 3 (three) times daily. Patient not taking: Reported on 07/03/2014 03/07/14   [place] Orlene Och, NP  glipiZIDE (GLUCOTROL) 10 MG tablet Take 1 tablet (10 mg total) by mouth 2 (two) times daily before a meal. 07/03/14   Glynn Octave, MD  HYDROcodone-acetaminophen (NORCO/VICODIN) 5-325 MG per tablet Take 1 tablet by mouth every 4 (four) hours as needed. Patient not taking: Reported on 07/03/2014 12/19/13   Burgess Amor, PA-C  HYDROcodone-acetaminophen (NORCO/VICODIN) 5-325 MG per tablet Take 2 tablets by mouth every 4 (four) hours as needed for moderate pain or severe pain. Patient not taking: Reported on 07/03/2014 12/19/13   Burgess Amor, PA-C  lisinopril-hydrochlorothiazide (PRINZIDE,ZESTORETIC) 20-12.5 MG per tablet Take 1 tablet by mouth 2 (two) times daily. 07/03/14   Glynn Octave, MD  metFORMIN (GLUCOPHAGE) 1000 MG tablet Take 1 tablet (1,000 mg total) by mouth 2 (two) times daily with a meal. 07/03/14   Glynn Octave, MD  naproxen (NAPROSYN) 500 MG tablet Take 1 tablet (500 mg total) by mouth 2 (two) times daily. Patient not taking: Reported on 07/03/2014 03/07/14   Janne Napoleon, NP  traMADol (ULTRAM) 50 MG tablet Take 1 tablet (50 mg total) by mouth every 6 (six) hours as needed. Patient not taking: Reported on 07/03/2014 03/07/14  Hope Orlene OchM Neese, NP   BP 154/88 mmHg  Pulse 104  Temp(Src) 99.3 F (37.4 C) (Oral)  Resp 14  Ht 5' (1.524 m)  Wt 220 lb (99.791 kg)  BMI 42.97 kg/m2  SpO2 95% Physical Exam  Constitutional: He is oriented to person, place, and time. He appears well-developed and well-nourished. No distress.  HENT:  Head: Normocephalic and atraumatic.  Mouth/Throat: Oropharynx is clear and moist. No oropharyngeal exudate.  Dry mucous membranes.   Eyes: Conjunctivae and EOM are normal. Pupils are equal, round, and reactive to light.  Neck: Normal range of motion. Neck supple.   No meningismus.  Cardiovascular: Normal rate, regular rhythm, normal heart sounds and intact distal pulses.   No murmur heard. Pulmonary/Chest: Effort normal and breath sounds normal. No respiratory distress.  Abdominal: Soft. There is no tenderness. There is no rebound and no guarding.  Musculoskeletal: Normal range of motion. He exhibits no edema or tenderness.  Neurological: He is alert and oriented to person, place, and time. No cranial nerve deficit. He exhibits normal muscle tone. Coordination normal.  No ataxia on finger to nose bilaterally. No pronator drift. 5/5 strength throughout. CN 2-12 intact. Negative Romberg. Equal grip strength. Sensation intact. Gait is normal.   Skin: Skin is warm.  Psychiatric: He has a normal mood and affect. His behavior is normal.  Nursing note and vitals reviewed.   ED Course  Procedures (including critical care time) DIAGNOSTIC STUDIES: Oxygen Saturation is 95% on RA, normal by my interpretation.    COORDINATION OF CARE: 9:25 PM- Pt advised of plan for treatment which includes labs and pt agrees.  Labs Review Labs Reviewed  CBC WITH DIFFERENTIAL/PLATELET - Abnormal; Notable for the following:    Hemoglobin 12.9 (*)    HCT 37.5 (*)    All other components within normal limits  BASIC METABOLIC PANEL - Abnormal; Notable for the following:    Potassium 3.3 (*)    Glucose, Bld 166 (*)    GFR calc non Af Amer 78 (*)    All other components within normal limits  URINALYSIS, ROUTINE W REFLEX MICROSCOPIC - Abnormal; Notable for the following:    Hgb urine dipstick TRACE (*)    Protein, ur 100 (*)    All other components within normal limits  CBG MONITORING, ED - Abnormal; Notable for the following:    Glucose-Capillary 210 (*)    All other components within normal limits  URINE MICROSCOPIC-ADD ON    Imaging Review No results found.   EKG Interpretation None      MDM   Final diagnoses:  Essential hypertension  Hyperglycemia    patient states out of his diabetic and blood pressure medications for 2 days. Denies chest pain, headache, vision change, fever, chills, nausea or vomiting. No focal weakness, numbness or tingling. No bowel or bladder incontinence.  Labs unremarkable. No evidence of DKA. Anion gap normal. Blood pressure well controlled in the ED.  meds refilled.  Follow up with PCP. BP 134/98 mmHg  Pulse 94  Temp(Src) 99.3 F (37.4 C) (Oral)  Resp 15  Ht 5' (1.524 m)  Wt 220 lb (99.791 kg)  BMI 42.97 kg/m2  SpO2 94%   I personally performed the services described in this documentation, which was scribed in my presence. The recorded information has been reviewed and is accurate.     Glynn OctaveStephen Lilyanne Mcquown, MD 07/04/14 [phone]

## 2014-10-03 ENCOUNTER — Encounter (HOSPITAL_COMMUNITY): Payer: Self-pay | Admitting: *Deleted

## 2014-10-03 ENCOUNTER — Emergency Department (HOSPITAL_COMMUNITY)
Admission: EM | Admit: 2014-10-03 | Discharge: 2014-10-03 | Disposition: A | Discharge status: Home / Self Care | Payer: BLUE CROSS/BLUE SHIELD | Attending: Emergency Medicine | Admitting: Emergency Medicine

## 2014-10-03 DIAGNOSIS — Z760 Encounter for issue of repeat prescription: Secondary | ICD-10-CM

## 2014-10-03 DIAGNOSIS — I10 Essential (primary) hypertension: Secondary | ICD-10-CM | POA: Diagnosis not present

## 2014-10-03 DIAGNOSIS — E119 Type 2 diabetes mellitus without complications: Secondary | ICD-10-CM | POA: Diagnosis not present

## 2014-10-03 DIAGNOSIS — Z79899 Other long term (current) drug therapy: Secondary | ICD-10-CM | POA: Insufficient documentation

## 2014-10-03 DIAGNOSIS — J45909 Unspecified asthma, uncomplicated: Secondary | ICD-10-CM | POA: Insufficient documentation

## 2014-10-03 LAB — CBG MONITORING, ED: Glucose-Capillary: 135 mg/dL — ABNORMAL HIGH (ref 70–99)

## 2014-10-03 MED ORDER — METFORMIN HCL 1000 MG PO TABS: 1000.0000 mg | ORAL_TABLET | Freq: Two times a day (BID) | ORAL | Status: AC

## 2014-10-03 MED ORDER — LISINOPRIL-HYDROCHLOROTHIAZIDE 20-12.5 MG PO TABS: 1.0000 | ORAL_TABLET | Freq: Two times a day (BID) | ORAL | Status: AC

## 2014-10-03 MED ORDER — GLIPIZIDE 10 MG PO TABS
10.0000 mg | ORAL_TABLET | Freq: Two times a day (BID) | ORAL | Status: AC
Start: 2014-10-03 — End: ?

## 2014-10-03 NOTE — ED Notes (Signed)
Discharge instructions given, pt demonstrated teach back and verbal understanding. No concerns voiced.  

## 2014-10-03 NOTE — ED Notes (Signed)
Pt states he has run out of his blood pressure medication & diabetes meds.
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
## 2014-10-03 NOTE — Discharge Instructions (Signed)
Work with your doctor to make sure you can get her medications refilled even if you're not able to get to his office.   Medication Refill, Emergency Department We have refilled your medication today as a courtesy to you. It is best for your medical care, however, to take care of getting refills done through your primary caregiver's office. They have your records and can do a better job of follow-up than we can in the emergency department. On maintenance medications, we often only prescribe enough medications to get you by until you are able to see your regular caregiver. This is a more expensive way to refill medications. In the future, please plan for refills so that you will not have to use the emergency department for this. Thank you for your help. Your help allows us to better take care of the daily emergencies that enter our department. Document Released: 09/07/2003 Document Revised: 08/13/2011 Document Reviewed: 08/28/2013 [facility] Patient Information 2015 [place], [place]. This information is not intended to replace advice given to you by your health care provider. Make sure you discuss any questions you have with your health care provider.

## 2014-10-28 ENCOUNTER — Encounter (HOSPITAL_COMMUNITY): Payer: Self-pay

## 2014-10-28 ENCOUNTER — Emergency Department (HOSPITAL_COMMUNITY)
Admission: EM | Admit: 2014-10-28 | Discharge: 2014-10-28 | Disposition: A | Discharge status: Home / Self Care | Payer: BLUE CROSS/BLUE SHIELD | Attending: Emergency Medicine | Admitting: Emergency Medicine

## 2014-10-28 ENCOUNTER — Emergency Department (HOSPITAL_COMMUNITY): Payer: BLUE CROSS/BLUE SHIELD

## 2014-10-28 DIAGNOSIS — R51 Headache: Secondary | ICD-10-CM | POA: Insufficient documentation

## 2014-10-28 DIAGNOSIS — M542 Cervicalgia: Secondary | ICD-10-CM | POA: Insufficient documentation

## 2014-10-28 DIAGNOSIS — J45909 Unspecified asthma, uncomplicated: Secondary | ICD-10-CM | POA: Insufficient documentation

## 2014-10-28 DIAGNOSIS — Z79899 Other long term (current) drug therapy: Secondary | ICD-10-CM | POA: Diagnosis not present

## 2014-10-28 DIAGNOSIS — I10 Essential (primary) hypertension: Secondary | ICD-10-CM | POA: Insufficient documentation

## 2014-10-28 DIAGNOSIS — E119 Type 2 diabetes mellitus without complications: Secondary | ICD-10-CM | POA: Diagnosis not present

## 2014-10-28 DIAGNOSIS — R55 Syncope and collapse: Secondary | ICD-10-CM | POA: Diagnosis present

## 2014-10-28 LAB — RAPID URINE DRUG SCREEN, HOSP PERFORMED
Amphetamines: NOT DETECTED
BENZODIAZEPINES: NOT DETECTED
Barbiturates: NOT DETECTED
Cocaine: NOT DETECTED
Opiates: NOT DETECTED
TETRAHYDROCANNABINOL: NOT DETECTED

## 2014-10-28 LAB — COMPREHENSIVE METABOLIC PANEL
ALK PHOS: 77 U/L (ref 38–126)
ALT: 43 U/L (ref 17–63)
AST: 31 U/L (ref 15–41)
Albumin: 4.5 g/dL (ref 3.5–5.0)
Anion gap: 10 (ref 5–15)
BILIRUBIN TOTAL: 0.9 mg/dL (ref 0.3–1.2)
BUN: 21 mg/dL — ABNORMAL HIGH (ref 6–20)
CO2: 30 mmol/L (ref 22–32)
CREATININE: 1.46 mg/dL — AB (ref 0.61–1.24)
Calcium: 9.4 mg/dL (ref 8.9–10.3)
Chloride: 96 mmol/L — ABNORMAL LOW (ref 101–111)
GFR calc Af Amer: >60 mL/min (ref >60)
GFR calc non Af Amer: 56 mL/min — ABNORMAL LOW (ref >60)
Glucose, Bld: 198 mg/dL — ABNORMAL HIGH (ref 65–99)
Potassium: 3.9 mmol/L (ref 3.5–5.1)
Sodium: 136 mmol/L (ref 135–145)
Total Protein: 8.7 g/dL — ABNORMAL HIGH (ref 6.5–8.1)

## 2014-10-28 LAB — CBC WITH DIFFERENTIAL/PLATELET
Basophils Absolute: 0 10*3/uL (ref 0.0–0.1)
Basophils Relative: 0 % (ref 0–1)
EOS PCT: 1 % (ref 0–5)
Eosinophils Absolute: 0.1 10*3/uL (ref 0.0–0.7)
HEMATOCRIT: 41.6 % (ref 39.0–52.0)
Hemoglobin: 14.2 g/dL (ref 13.0–17.0)
LYMPHS ABS: 1.6 10*3/uL (ref 0.7–4.0)
LYMPHS PCT: 16 % (ref 12–46)
MCH: 28.1 pg (ref 26.0–34.0)
MCHC: 34.1 g/dL (ref 30.0–36.0)
MCV: 82.4 fL (ref 78.0–100.0)
Monocytes Absolute: 0.4 10*3/uL (ref 0.1–1.0)
Monocytes Relative: 4 % (ref 3–12)
NEUTROS ABS: 7.8 10*3/uL — AB (ref 1.7–7.7)
Neutrophils Relative %: 79 % — ABNORMAL HIGH (ref 43–77)
Platelets: 349 10*3/uL (ref 150–400)
RBC: 5.05 MIL/uL (ref 4.22–5.81)
RDW: 15.1 % (ref 11.5–15.5)
WBC: 9.8 10*3/uL (ref 4.0–10.5)

## 2014-10-28 LAB — URINALYSIS, ROUTINE W REFLEX MICROSCOPIC
Bilirubin Urine: NEGATIVE
Glucose, UA: NEGATIVE mg/dL
KETONES UR: NEGATIVE mg/dL
LEUKOCYTES UA: NEGATIVE
Nitrite: NEGATIVE
PROTEIN: 30 mg/dL — AB
Specific Gravity, Urine: 1.025 (ref 1.005–1.030)
Urobilinogen, UA: 0.2 mg/dL (ref 0.0–1.0)
pH: 5.5 (ref 5.0–8.0)

## 2014-10-28 LAB — TROPONIN I: Troponin I: <0.03 ng/mL (ref <0.031)

## 2014-10-28 LAB — URINE MICROSCOPIC-ADD ON

## 2014-10-28 MED ORDER — HYDROCODONE-ACETAMINOPHEN 5-325 MG PO TABS: 1.0000 | ORAL_TABLET | ORAL | Status: DC | PRN

## 2014-10-28 NOTE — ED Provider Notes (Signed)
CSN: [id number]     Arrival date & time 10/28/14  0555 History   First MD Initiated Contact with Patient 10/28/14 [phone]     Chief Complaint  Patient presents with  . Loss of Consciousness     (Consider location/radiation/quality/duration/timing/severity/associated sxs/prior Treatment) HPI Comments: Patient presents to the emergency department for evaluation after syncope. Patient reports that he was at work when the episode occurred. He does not exactly remember when it occurred or what happened. Patient reports that he fell backwards and hit his head. He is complaining of pain in his posterior head as well as neck area. Both of his shoulders are hurting. Patient denies chest pain, shortness of breath. He has not had any recent illness, denies nausea, vomiting, diarrhea, abdominal pain, cough.  Patient is a 44 y.o. male presenting with syncope.  Loss of Consciousness Associated symptoms: headaches     Past Medical History  Diagnosis Date  . Asthma   . HTN (hypertension)   . Diabetes mellitus without complication    History reviewed. No pertinent past surgical history. Family History  Problem Relation Age of Onset  . Cancer Other   . Diabetes Other   . Hypertension Other    History  Substance Use Topics  . Smoking status: Never Smoker   . Smokeless tobacco: Never Used  . Alcohol Use: No    Review of Systems  Cardiovascular: Positive for syncope.  Musculoskeletal: Positive for back pain and neck pain.  Neurological: Positive for syncope and headaches.  All other systems reviewed and are negative.     Allergies  Review of patient's allergies indicates no known allergies.  Home Medications   Prior to Admission medications   Medication Sig Start Date End Date Taking? Authorizing Provider  ADVAIR DISKUS 250-50 MCG/DOSE AEPB Inhale 1 puff into the lungs 2 (two) times daily as needed.  11/03/13  Yes Historical Provider, MD  glipiZIDE (GLUCOTROL) 10 MG tablet Take 1 tablet  (10 mg total) by mouth 2 (two) times daily before a meal. 10/03/14  Yes Dione Booze, MD  lisinopril-hydrochlorothiazide (PRINZIDE,ZESTORETIC) 20-12.5 MG per tablet Take 1 tablet by mouth 2 (two) times daily. 10/03/14  Yes Dione Booze, MD  metFORMIN (GLUCOPHAGE) 1000 MG tablet Take 1 tablet (1,000 mg total) by mouth 2 (two) times daily with a meal. 10/03/14  Yes Dione Booze, MD   BP 155/106 mmHg  Pulse 90  Temp(Src) 98.8 F (37.1 C) (Oral)  Resp 21  Ht  (1.549 m)  Wt 215 lb (97.523 kg)  BMI 40.64 kg/m2  SpO2 97% Physical Exam  Constitutional: He is oriented to person, place, and time. He appears well-developed and well-nourished. No distress.  HENT:  Head: Normocephalic and atraumatic.  Right Ear: Hearing normal.  Left Ear: Hearing normal.  Nose: Nose normal.  Mouth/Throat: Oropharynx is clear and moist and mucous membranes are normal.  Eyes: Conjunctivae and EOM are normal. Pupils are equal, round, and reactive to light.  Neck: Normal range of motion. Neck supple. Muscular tenderness present.    Cardiovascular: Regular rhythm, S1 normal and S2 normal.  Exam reveals no gallop and no friction rub.   No murmur heard. Pulmonary/Chest: Effort normal and breath sounds normal. No respiratory distress. He exhibits no tenderness.  Abdominal: Soft. Normal appearance and bowel sounds are normal. There is no hepatosplenomegaly. There is no tenderness. There is no rebound, no guarding, no tenderness at McBurney's point and negative Murphy's sign. No hernia.  Musculoskeletal: Normal range of motion.  Thoracic back: He exhibits no bony tenderness.       Back:       Arms: Neurological: He is alert and oriented to person, place, and time. He has normal strength. No cranial nerve deficit or sensory deficit. Coordination normal. GCS eye subscore is 4. GCS verbal subscore is 5. GCS motor subscore is 6.  Skin: Skin is warm, dry and intact. No rash noted. No cyanosis.  Psychiatric: He has a normal  mood and affect. His speech is normal and behavior is normal. Thought content normal.  Nursing note and vitals reviewed.   ED Course  Procedures (including critical care time) Labs Review Labs Reviewed  URINALYSIS, ROUTINE W REFLEX MICROSCOPIC (NOT AT [facility]) - Abnormal; Notable for the following:    Hgb urine dipstick TRACE (*)    Protein, ur 30 (*)    All other components within normal limits  URINE RAPID DRUG SCREEN (HOSP PERFORMED) NOT AT [facility]  URINE MICROSCOPIC-ADD ON  CBC WITH DIFFERENTIAL/PLATELET  COMPREHENSIVE METABOLIC PANEL  TROPONIN I    Imaging Review No results found.   EKG Interpretation   Date/Time:  Thursday Oct 28 2014 06:14:16 EDT Ventricular Rate:  83 PR Interval:  146 QRS Duration: 81 QT Interval:  367 QTC Calculation: 431 R Axis:   65 Text Interpretation:  Sinus rhythm Baseline wander in lead(s) V1 Otherwise  within normal limits Confirmed by POLLINA  MD, CHRISTOPHER [phone]([id number]) on  10/28/2014 6:41:48 AM      MDM   Final diagnoses:  Syncope  Syncope    Patient presents to the ER from work after a syncopal episode. Patient does not remember the events of the episode. He does know that he fell backwards and is complaining of head and neck pain. CT head and cervical spine were performed, no acute abnormality. Patient not complaining of chest pain, shortness of breath, abdominal pain or other symptoms at this time. Cardiac evaluation performed, unremarkable EKG. Blood work unremarkable. Patient is back to his baseline without complaints. I believe he is extremely low risk for cardiac syncope and there are no stroke symptoms. Neurologic examination unremarkable at this time. Patient appropriate for discharge, outpatient follow-up with primary doctor.    Gilda Creasehristopher J Pollina, MD 10/28/14 [phone]

## 2014-10-28 NOTE — ED Notes (Signed)
Pt is alert and oriented at this time.  States that he is having pain in shoulders and neck.  States that he last remembers standing at work around [employer]3am and then being helped off of the floor around 4am.  Denies loss of bladder or bowel control.  No witnessed seizure activity by coworkers per pt.  No neuro deficit noted at this time.

## 2014-10-28 NOTE — ED Notes (Signed)
EKG done @ 614 am and given to EDP.

## 2014-10-28 NOTE — ED Notes (Signed)
Pt states he feels like he passed out sometime between 3 and 4 am.  Pt states he has pain to both shoulders and the back of his head.

## 2015-06-08 ENCOUNTER — Emergency Department (HOSPITAL_COMMUNITY)
Admission: EM | Admit: 2015-06-08 | Discharge: 2015-06-08 | Disposition: A | Discharge status: Home / Self Care | Payer: Self-pay | Attending: Emergency Medicine | Admitting: Emergency Medicine

## 2015-06-08 ENCOUNTER — Encounter (HOSPITAL_COMMUNITY): Payer: Self-pay | Admitting: *Deleted

## 2015-06-08 DIAGNOSIS — E119 Type 2 diabetes mellitus without complications: Secondary | ICD-10-CM | POA: Insufficient documentation

## 2015-06-08 DIAGNOSIS — Z79899 Other long term (current) drug therapy: Secondary | ICD-10-CM | POA: Insufficient documentation

## 2015-06-08 DIAGNOSIS — I10 Essential (primary) hypertension: Secondary | ICD-10-CM | POA: Insufficient documentation

## 2015-06-08 DIAGNOSIS — L03011 Cellulitis of right finger: Secondary | ICD-10-CM

## 2015-06-08 DIAGNOSIS — J45909 Unspecified asthma, uncomplicated: Secondary | ICD-10-CM | POA: Insufficient documentation

## 2015-06-08 DIAGNOSIS — Z7984 Long term (current) use of oral hypoglycemic drugs: Secondary | ICD-10-CM | POA: Insufficient documentation

## 2015-06-08 MED ORDER — CEPHALEXIN 500 MG PO CAPS: 500.0000 mg | ORAL_CAPSULE | Freq: Four times a day (QID) | ORAL | Status: AC

## 2015-06-08 MED ORDER — HYDROCODONE-ACETAMINOPHEN 5-325 MG PO TABS: 1.0000 | ORAL_TABLET | Freq: Four times a day (QID) | ORAL | Status: AC | PRN

## 2015-06-08 MED ORDER — SULFAMETHOXAZOLE-TRIMETHOPRIM 800-160 MG PO TABS: 1.0000 | ORAL_TABLET | Freq: Two times a day (BID) | ORAL | Status: AC

## 2015-06-08 MED ORDER — LIDOCAINE HCL (PF) 2 % IJ SOLN
10.0000 mL | Freq: Once | INTRAMUSCULAR | Status: AC
  Administered 2015-06-08: 10 mL
  Filled 2015-06-08: qty 10

## 2015-06-08 NOTE — Discharge Instructions (Signed)
Keflex and Bactrim as prescribed.  Hydrocodone as prescribed as needed for pain.  Perform warm soaks as frequently as possible for the next several days.  Return to the ER if symptoms significantly worsen or change.   Paronychia Paronychia is an infection of the skin that surrounds a nail. It usually affects the skin around a fingernail, but it may also occur near a toenail. It often causes pain and swelling around the nail. This condition may come on suddenly or develop over a longer period. In some cases, a collection of pus (abscess) can form near or under the nail. Usually, paronychia is not serious and it clears up with treatment. CAUSES This condition may be caused by bacteria or fungi. It is commonly caused by either Streptococcus or Staphylococcus bacteria. The bacteria or fungi often cause the infection by getting into the affected area through an opening in the skin, such as a cut or a hangnail. RISK FACTORS This condition is more likely to develop in:  People who get their hands wet often, such as those who work as Fish farm managerdishwashers, bartenders, or nurses.  People who bite their fingernails or suck their thumbs.  People who trim their nails too short.  People who have hangnails or injured fingertips.  People who get manicures.  People who have diabetes. SYMPTOMS Symptoms of this condition include:  Redness and swelling of the skin near the nail.  Tenderness around the nail when you touch the area.  Pus-filled bumps under the cuticle. The cuticle is the skin at the base or sides of the nail.  Fluid or pus under the nail.  Throbbing pain in the area. DIAGNOSIS This condition is usually diagnosed with a physical exam. In some cases, a sample of pus may be taken from an abscess to be tested in a lab. This can help to determine what type of bacteria or fungi is causing the condition. TREATMENT Treatment for this condition depends on the cause and severity of the condition.  If the condition is mild, it may clear up on its own in a few days. Your health care provider may recommend soaking the affected area in warm water a few times a day. When treatment is needed, the options may include:  Antibiotic medicine, if the condition is caused by a bacterial infection.  Antifungal medicine, if the condition is caused by a fungal infection.  Incision and drainage, if an abscess is present. In this procedure, the health care provider will cut open the abscess so the pus can drain out. HOME CARE INSTRUCTIONS  Soak the affected area in warm water if directed to do so by your health care provider. You may be told to do this for 20 minutes, 2-3 times a day. Keep the area dry in between soakings.  Take medicines only as directed by your health care provider.  If you were prescribed an antibiotic medicine, finish all of it even if you start to feel better.  Keep the affected area clean.  Do not try to drain a fluid-filled bump yourself.  If you will be washing dishes or performing other tasks that require your hands to get wet, wear rubber gloves. You should also wear gloves if your hands might come in contact with irritating substances, such as cleaners or chemicals.  Follow your health care provider's instructions about:  Wound care.  Bandage (dressing) changes and removal. SEEK MEDICAL CARE IF:  Your symptoms get worse or do not improve with treatment.  You have a  fever or chills.  You have redness spreading from the affected area.  You have continued or increased fluid, blood, or pus coming from the affected area.  Your finger or knuckle becomes swollen or is difficult to move.   This information is not intended to replace advice given to you by your health care provider. Make sure you discuss any questions you have with your health care provider.   Document Released: 11/14/2000 Document Revised: 10/05/2014 Document Reviewed: 04/28/2014 Elsevier Interactive  Patient Education [employer]

## 2015-06-08 NOTE — ED Notes (Signed)
Pt states he injured his finger 2 weeks ago by getting it caught under a microwave. Pt has had increase swelling and pain since then. Injury is to patients right hand middle finger.

## 2015-06-08 NOTE — ED Provider Notes (Signed)
CSN: [id number]     Arrival date & time 06/08/15  0840 History  By signing my name below, I, Ricardo Rogers, attest that this documentation has been prepared under the direction and in the presence of Geoffery Lyons, MD. Electronically Signed: Phillis Rogers, ED Scribe. 06/08/2015. 9:02 AM.  Chief Complaint  Patient presents with  . Hand Pain   Patient is a 45 y.o. male presenting with hand pain. The history is provided by the patient. No language interpreter was used.  Hand Pain This is a new problem. The current episode started more than 1 week ago. The problem occurs constantly. The problem has been gradually worsening. He has tried nothing for the symptoms.  HPI Comments: Ricardo Rogers is a 45 y.o. Male with a hx of HTN and DM who presents to the Emergency Department complaining of gradually worsening, throbbing right middle finger pain onset 2 weeks ago. Pt states that he was lifting a microwave and dropped it on his hand. He reports that the finger swelled, but he pricked it with his CBG monitor and it drained. He states that a few days later, the swelling returned and has not drained since. He states that his sugar levels have been decent. He denies fever or chills.   Past Medical History  Diagnosis Date  . Asthma   . HTN (hypertension)   . Diabetes mellitus without complication (HCC)    History reviewed. No pertinent past surgical history. Family History  Problem Relation Age of Onset  . Cancer Other   . Diabetes Other   . Hypertension Other    Social History  Substance Use Topics  . Smoking status: Never Smoker   . Smokeless tobacco: Never Used  . Alcohol Use: No    Review of Systems  Constitutional: Negative for fever and chills.  Musculoskeletal: Positive for arthralgias.  All other systems reviewed and are negative.   Allergies  Review of patient's allergies indicates no known allergies.  Home Medications   Prior to Admission medications   Medication Sig Start  Date End Date Taking? Authorizing Provider  ADVAIR DISKUS 250-50 MCG/DOSE AEPB Inhale 1 puff into the lungs 2 (two) times daily as needed.  11/03/13   Historical Provider, MD  glipiZIDE (GLUCOTROL) 10 MG tablet Take 1 tablet (10 mg total) by mouth 2 (two) times daily before a meal. 10/03/14   Dione Booze, MD  HYDROcodone-acetaminophen (NORCO/VICODIN) 5-325 MG per tablet Take 1-2 tablets by mouth every 4 (four) hours as needed for moderate pain. 10/28/14   Gilda Crease, MD  lisinopril-hydrochlorothiazide (PRINZIDE,ZESTORETIC) 20-12.5 MG per tablet Take 1 tablet by mouth 2 (two) times daily. 10/03/14   Dione Booze, MD  metFORMIN (GLUCOPHAGE) 1000 MG tablet Take 1 tablet (1,000 mg total) by mouth 2 (two) times daily with a meal. 10/03/14   Dione Booze, MD   BP 193/128 mmHg  Pulse 108  Temp(Src) 97.9 F (36.6 C) (Oral)  Resp 16  Ht 5\' 1"  (1.549 m)  Wt 210 lb (95.255 kg)  BMI 39.70 kg/m2  SpO2 99% Physical Exam  Constitutional: He is oriented to person, place, and time. He appears well-developed and well-nourished.  HENT:  Head: Normocephalic and atraumatic.  Eyes: EOM are normal.  Neck: Normal range of motion.  Cardiovascular: Normal rate, regular rhythm, normal heart sounds and intact distal pulses.   Pulmonary/Chest: Effort normal and breath sounds normal. No respiratory distress.  Abdominal: Soft. He exhibits no distension. There is no tenderness.  Musculoskeletal: Normal range of  motion.  Right middle finger has a significantly swollen and tender area adjacent to the nail  Neurological: He is alert and oriented to person, place, and time.  Skin: Skin is warm and dry.  Psychiatric: He has a normal mood and affect. Judgment normal.  Nursing note and vitals reviewed.   ED Course  Procedures (including critical care time) DIAGNOSTIC STUDIES: Oxygen Saturation is 99% on RA, normal by my interpretation.    COORDINATION OF CARE: 9:01 AM-Discussed treatment plan which includes drainage  of the finger with pt at bedside and pt agreed to plan.    Labs Review Labs Reviewed - No data to display  Imaging Review No results found. I have personally reviewed and evaluated these images and lab results as part of my medical decision-making.  INCISION AND DRAINAGE Performed by: Geoffery LyonseLo, Atisha Hamidi Consent: Verbal consent obtained. Risks and benefits: risks, benefits and alternatives were discussed Type: abscess   Body area: Right middle finger  Anesthesia: local infiltration  Incision was made with a scalpel.  Local anesthetic: Digital Block with lidocaine 2% without epinephrine  Anesthetic total: 5 ml  Complexity: complex Blunt dissection to break up loculations  Drainage: purulent  Drainage amount: moderate  Packing material: no packing placed  Patient tolerance: Patient tolerated the procedure well with no immediate complications.      MDM   Final diagnoses:  None    Patient presents with a paronychia to the right middle finger. This was drained and a large quantity of pus was expressed. He will be placed in a dressing, advised to soak as frequently as possible, treated with antibiotics and pain medication, and is to return as needed for any problems.  I personally performed the services described in this documentation, which was scribed in my presence. The recorded information has been reviewed and is accurate.        Geoffery Lyonsouglas Osmond Steckman, MD 06/08/15 [phone]

## 2017-04-22 ENCOUNTER — Other Ambulatory Visit: Payer: Self-pay

## 2017-04-22 ENCOUNTER — Emergency Department (HOSPITAL_COMMUNITY): Payer: BLUE CROSS/BLUE SHIELD

## 2017-04-22 ENCOUNTER — Encounter (HOSPITAL_COMMUNITY): Payer: Self-pay

## 2017-04-22 ENCOUNTER — Inpatient Hospital Stay (HOSPITAL_COMMUNITY): Payer: BLUE CROSS/BLUE SHIELD

## 2017-04-22 ENCOUNTER — Inpatient Hospital Stay (HOSPITAL_COMMUNITY)
Admission: EM | Admit: 2017-04-22 | Discharge: 2017-05-04 | DRG: 100 | Disposition: E | Payer: BLUE CROSS/BLUE SHIELD | Attending: Internal Medicine | Admitting: Internal Medicine

## 2017-04-22 DIAGNOSIS — R569 Unspecified convulsions: Secondary | ICD-10-CM | POA: Diagnosis not present

## 2017-04-22 DIAGNOSIS — I361 Nonrheumatic tricuspid (valve) insufficiency: Secondary | ICD-10-CM | POA: Diagnosis not present

## 2017-04-22 DIAGNOSIS — E1101 Type 2 diabetes mellitus with hyperosmolarity with coma: Secondary | ICD-10-CM

## 2017-04-22 DIAGNOSIS — Z833 Family history of diabetes mellitus: Secondary | ICD-10-CM

## 2017-04-22 DIAGNOSIS — I129 Hypertensive chronic kidney disease with stage 1 through stage 4 chronic kidney disease, or unspecified chronic kidney disease: Secondary | ICD-10-CM | POA: Diagnosis present

## 2017-04-22 DIAGNOSIS — R34 Anuria and oliguria: Secondary | ICD-10-CM | POA: Diagnosis not present

## 2017-04-22 DIAGNOSIS — J9600 Acute respiratory failure, unspecified whether with hypoxia or hypercapnia: Secondary | ICD-10-CM

## 2017-04-22 DIAGNOSIS — R402242 Coma scale, best verbal response, confused conversation, at arrival to emergency department: Secondary | ICD-10-CM | POA: Diagnosis present

## 2017-04-22 DIAGNOSIS — E1122 Type 2 diabetes mellitus with diabetic chronic kidney disease: Secondary | ICD-10-CM | POA: Diagnosis present

## 2017-04-22 DIAGNOSIS — G40901 Epilepsy, unspecified, not intractable, with status epilepticus: Secondary | ICD-10-CM | POA: Diagnosis present

## 2017-04-22 DIAGNOSIS — Z8249 Family history of ischemic heart disease and other diseases of the circulatory system: Secondary | ICD-10-CM

## 2017-04-22 DIAGNOSIS — Z452 Encounter for adjustment and management of vascular access device: Secondary | ICD-10-CM

## 2017-04-22 DIAGNOSIS — I959 Hypotension, unspecified: Secondary | ICD-10-CM

## 2017-04-22 DIAGNOSIS — N170 Acute kidney failure with tubular necrosis: Secondary | ICD-10-CM | POA: Diagnosis present

## 2017-04-22 DIAGNOSIS — Z7984 Long term (current) use of oral hypoglycemic drugs: Secondary | ICD-10-CM | POA: Diagnosis not present

## 2017-04-22 DIAGNOSIS — J690 Pneumonitis due to inhalation of food and vomit: Secondary | ICD-10-CM | POA: Diagnosis not present

## 2017-04-22 DIAGNOSIS — G931 Anoxic brain damage, not elsewhere classified: Secondary | ICD-10-CM | POA: Diagnosis not present

## 2017-04-22 DIAGNOSIS — J45909 Unspecified asthma, uncomplicated: Secondary | ICD-10-CM | POA: Diagnosis present

## 2017-04-22 DIAGNOSIS — J9601 Acute respiratory failure with hypoxia: Secondary | ICD-10-CM | POA: Diagnosis not present

## 2017-04-22 DIAGNOSIS — Z66 Do not resuscitate: Secondary | ICD-10-CM | POA: Diagnosis present

## 2017-04-22 DIAGNOSIS — J9602 Acute respiratory failure with hypercapnia: Secondary | ICD-10-CM | POA: Diagnosis not present

## 2017-04-22 DIAGNOSIS — Z7951 Long term (current) use of inhaled steroids: Secondary | ICD-10-CM

## 2017-04-22 DIAGNOSIS — I10 Essential (primary) hypertension: Secondary | ICD-10-CM | POA: Diagnosis present

## 2017-04-22 DIAGNOSIS — E875 Hyperkalemia: Secondary | ICD-10-CM | POA: Diagnosis not present

## 2017-04-22 DIAGNOSIS — R29731 NIHSS score 31: Secondary | ICD-10-CM | POA: Diagnosis not present

## 2017-04-22 DIAGNOSIS — N183 Chronic kidney disease, stage 3 (moderate): Secondary | ICD-10-CM | POA: Diagnosis present

## 2017-04-22 DIAGNOSIS — R402362 Coma scale, best motor response, obeys commands, at arrival to emergency department: Secondary | ICD-10-CM | POA: Diagnosis present

## 2017-04-22 DIAGNOSIS — E872 Acidosis, unspecified: Secondary | ICD-10-CM

## 2017-04-22 DIAGNOSIS — Z9119 Patient's noncompliance with other medical treatment and regimen: Secondary | ICD-10-CM | POA: Diagnosis not present

## 2017-04-22 DIAGNOSIS — R739 Hyperglycemia, unspecified: Secondary | ICD-10-CM

## 2017-04-22 DIAGNOSIS — Z79891 Long term (current) use of opiate analgesic: Secondary | ICD-10-CM | POA: Diagnosis not present

## 2017-04-22 DIAGNOSIS — K7200 Acute and subacute hepatic failure without coma: Secondary | ICD-10-CM | POA: Diagnosis not present

## 2017-04-22 DIAGNOSIS — I952 Hypotension due to drugs: Secondary | ICD-10-CM | POA: Diagnosis not present

## 2017-04-22 DIAGNOSIS — J810 Acute pulmonary edema: Secondary | ICD-10-CM

## 2017-04-22 DIAGNOSIS — J80 Acute respiratory distress syndrome: Secondary | ICD-10-CM | POA: Diagnosis present

## 2017-04-22 DIAGNOSIS — I469 Cardiac arrest, cause unspecified: Secondary | ICD-10-CM | POA: Diagnosis not present

## 2017-04-22 DIAGNOSIS — E1111 Type 2 diabetes mellitus with ketoacidosis with coma: Secondary | ICD-10-CM | POA: Diagnosis not present

## 2017-04-22 DIAGNOSIS — E876 Hypokalemia: Secondary | ICD-10-CM | POA: Diagnosis not present

## 2017-04-22 DIAGNOSIS — Z978 Presence of other specified devices: Secondary | ICD-10-CM

## 2017-04-22 DIAGNOSIS — J9622 Acute and chronic respiratory failure with hypercapnia: Secondary | ICD-10-CM

## 2017-04-22 DIAGNOSIS — R001 Bradycardia, unspecified: Secondary | ICD-10-CM | POA: Diagnosis present

## 2017-04-22 DIAGNOSIS — Z515 Encounter for palliative care: Secondary | ICD-10-CM | POA: Diagnosis not present

## 2017-04-22 DIAGNOSIS — R402142 Coma scale, eyes open, spontaneous, at arrival to emergency department: Secondary | ICD-10-CM | POA: Diagnosis present

## 2017-04-22 HISTORY — DX: Unspecified convulsions: R56.9

## 2017-04-22 LAB — BLOOD GAS, ARTERIAL
ACID-BASE DEFICIT: 5.3 mmol/L — AB (ref 0.0–2.0)
Acid-base deficit: 1.9 mmol/L (ref 0.0–2.0)
BICARBONATE: 19.7 mmol/L — AB (ref 20.0–28.0)
Bicarbonate: 21.4 mmol/L (ref 20.0–28.0)
DRAWN BY: 23534
DRAWN BY: 274071
FIO2: 1
FIO2: 70
LHR: 14 {breaths}/min
MECHVT: 550 mL
MECHVT: 550 mL
O2 CONTENT: 100 L/min
O2 Saturation: 99.2 %
O2 Saturation: 88.5 %
PEEP/CPAP: 5 cmH2O
PEEP/CPAP: 5 cmH2O
Patient temperature: 37
RATE: 14 resp/min
pCO2 arterial: 60.5 mmHg — ABNORMAL HIGH (ref 32.0–48.0)
pCO2 arterial: 40.7 mmHg (ref 32.0–48.0)
pH, Arterial: 7.234 — ABNORMAL LOW (ref 7.350–7.450)
pH, Arterial: 7.309 — ABNORMAL LOW (ref 7.350–7.450)
pO2, Arterial: 309 mmHg — ABNORMAL HIGH (ref 83.0–108.0)
pO2, Arterial: 60 mmHg — ABNORMAL LOW (ref 83.0–108.0)

## 2017-04-22 LAB — CBC WITH DIFFERENTIAL/PLATELET
BASOS ABS: 0 10*3/uL (ref 0.0–0.1)
BASOS PCT: 0 %
Eosinophils Absolute: 0.1 10*3/uL (ref 0.0–0.7)
Eosinophils Relative: 0 %
HCT: 44.7 % (ref 39.0–52.0)
Hemoglobin: 15.5 g/dL (ref 13.0–17.0)
LYMPHS PCT: 21 %
Lymphs Abs: 4 10*3/uL (ref 0.7–4.0)
MCH: 28 pg (ref 26.0–34.0)
MCHC: 34.7 g/dL (ref 30.0–36.0)
MCV: 80.7 fL (ref 78.0–100.0)
MONO ABS: 0.9 10*3/uL (ref 0.1–1.0)
Monocytes Relative: 5 %
NEUTROS PCT: 74 %
Neutro Abs: 13.9 10*3/uL — ABNORMAL HIGH (ref 1.7–7.7)
PLATELETS: 296 10*3/uL (ref 150–400)
RBC: 5.54 MIL/uL (ref 4.22–5.81)
RDW: 13.7 % (ref 11.5–15.5)
WBC: 18.9 10*3/uL — ABNORMAL HIGH (ref 4.0–10.5)

## 2017-04-22 LAB — GLUCOSE, CAPILLARY
GLUCOSE-CAPILLARY: 149 mg/dL — AB (ref 65–99)
GLUCOSE-CAPILLARY: 225 mg/dL — AB (ref 65–99)
GLUCOSE-CAPILLARY: 227 mg/dL — AB (ref 65–99)
Glucose-Capillary: 458 mg/dL — ABNORMAL HIGH (ref 65–99)
Glucose-Capillary: 440 mg/dL — ABNORMAL HIGH (ref 65–99)
Glucose-Capillary: 386 mg/dL — ABNORMAL HIGH (ref 65–99)
Glucose-Capillary: 265 mg/dL — ABNORMAL HIGH (ref 65–99)
Glucose-Capillary: 195 mg/dL — ABNORMAL HIGH (ref 65–99)
Glucose-Capillary: 222 mg/dL — ABNORMAL HIGH (ref 65–99)
Glucose-Capillary: 160 mg/dL — ABNORMAL HIGH (ref 65–99)

## 2017-04-22 LAB — URINALYSIS, ROUTINE W REFLEX MICROSCOPIC
BILIRUBIN URINE: NEGATIVE
Glucose, UA: >=500 mg/dL — AB
KETONES UR: NEGATIVE mg/dL
LEUKOCYTES UA: NEGATIVE
NITRITE: NEGATIVE
PH: 5 (ref 5.0–8.0)
Protein, ur: 100 mg/dL — AB
Specific Gravity, Urine: 1.02 (ref 1.005–1.030)

## 2017-04-22 LAB — COMPREHENSIVE METABOLIC PANEL
ALT: 26 U/L (ref 17–63)
ANION GAP: 16 — AB (ref 5–15)
AST: 25 U/L (ref 15–41)
Albumin: 4 g/dL (ref 3.5–5.0)
Alkaline Phosphatase: 114 U/L (ref 38–126)
BUN: 15 mg/dL (ref 6–20)
CHLORIDE: 98 mmol/L — AB (ref 101–111)
CO2: 22 mmol/L (ref 22–32)
Calcium: 9.6 mg/dL (ref 8.9–10.3)
Creatinine, Ser: 1.54 mg/dL — ABNORMAL HIGH (ref 0.61–1.24)
GFR calc non Af Amer: 52 mL/min — ABNORMAL LOW (ref >60)
GFR, EST AFRICAN AMERICAN: 60 mL/min — AB (ref >60)
Glucose, Bld: 560 mg/dL (ref 65–99)
Potassium: 3.1 mmol/L — ABNORMAL LOW (ref 3.5–5.1)
SODIUM: 136 mmol/L (ref 135–145)
Total Bilirubin: 0.8 mg/dL (ref 0.3–1.2)
Total Protein: 8.4 g/dL — ABNORMAL HIGH (ref 6.5–8.1)

## 2017-04-22 LAB — I-STAT CHEM 8, ED
BUN: 18 mg/dL (ref 6–20)
CALCIUM ION: 1.2 mmol/L (ref 1.15–1.40)
Chloride: 101 mmol/L (ref 101–111)
Creatinine, Ser: 1.3 mg/dL — ABNORMAL HIGH (ref 0.61–1.24)
GLUCOSE: 590 mg/dL — AB (ref 65–99)
HEMATOCRIT: 48 % (ref 39.0–52.0)
HEMOGLOBIN: 16.3 g/dL (ref 13.0–17.0)
POTASSIUM: 3.8 mmol/L (ref 3.5–5.1)
SODIUM: 138 mmol/L (ref 135–145)
TCO2: 27 mmol/L (ref 22–32)

## 2017-04-22 LAB — CBG MONITORING, ED
GLUCOSE-CAPILLARY: 559 mg/dL — AB (ref 65–99)
Glucose-Capillary: >600 mg/dL (ref 65–99)

## 2017-04-22 LAB — RAPID URINE DRUG SCREEN, HOSP PERFORMED
Amphetamines: NOT DETECTED
BARBITURATES: NOT DETECTED
Benzodiazepines: NOT DETECTED
COCAINE: NOT DETECTED
OPIATES: NOT DETECTED
Tetrahydrocannabinol: NOT DETECTED

## 2017-04-22 LAB — ETHANOL: Alcohol, Ethyl (B): <10 mg/dL (ref <10)

## 2017-04-22 LAB — MRSA PCR SCREENING: MRSA by PCR: NEGATIVE

## 2017-04-22 LAB — TROPONIN I

## 2017-04-22 LAB — LACTIC ACID, PLASMA
Lactic Acid, Venous: 2 mmol/L (ref 0.5–1.9)
Lactic Acid, Venous: 5 mmol/L (ref 0.5–1.9)

## 2017-04-22 MED ORDER — SUCCINYLCHOLINE CHLORIDE 20 MG/ML IJ SOLN
100.0000 mg | Freq: Once | INTRAMUSCULAR | Status: AC
  Administered 2017-04-22: 100 mg via INTRAVENOUS

## 2017-04-22 MED ORDER — LEVALBUTEROL HCL 0.63 MG/3ML IN NEBU
0.6300 mg | INHALATION_SOLUTION | Freq: Four times a day (QID) | RESPIRATORY_TRACT | Status: DC
  Administered 2017-04-22 – 2017-04-23 (×4): 0.63 mg via RESPIRATORY_TRACT
  Filled 2017-04-22 (×4): qty 3

## 2017-04-22 MED ORDER — ONDANSETRON HCL 4 MG PO TABS: 4.0000 mg | ORAL_TABLET | Freq: Four times a day (QID) | ORAL | Status: DC | PRN

## 2017-04-22 MED ORDER — LIDOCAINE HCL (CARDIAC) 20 MG/ML IV SOLN
100.0000 mg | Freq: Once | INTRAVENOUS | Status: AC
  Administered 2017-04-22: 100 mg via INTRAVENOUS

## 2017-04-22 MED ORDER — METHYLPREDNISOLONE SODIUM SUCC 40 MG IJ SOLR
40.0000 mg | Freq: Four times a day (QID) | INTRAMUSCULAR | Status: DC
  Administered 2017-04-22 – 2017-04-24 (×6): 40 mg via INTRAVENOUS
  Filled 2017-04-22 (×6): qty 1

## 2017-04-22 MED ORDER — DEXTROSE 50 % IV SOLN: 25.0000 mL | INTRAVENOUS | Status: DC | PRN

## 2017-04-22 MED ORDER — SODIUM CHLORIDE 0.9 % IV SOLN
20.0000 ug/h | INTRAVENOUS | Status: DC
  Administered 2017-04-22: 20 ug/h via INTRAVENOUS
  Filled 2017-04-22: qty 50

## 2017-04-22 MED ORDER — SODIUM CHLORIDE 0.9 % IV SOLN
0.5000 mg/h | INTRAVENOUS | Status: DC
  Administered 2017-04-22: 0.5 mg/h via INTRAVENOUS
  Filled 2017-04-22: qty 10

## 2017-04-22 MED ORDER — MIDAZOLAM HCL 2 MG/2ML IJ SOLN
0.5000 mg | INTRAMUSCULAR | Status: DC | PRN
  Administered 2017-04-22: 0.5 mg via INTRAVENOUS
  Filled 2017-04-22: qty 2

## 2017-04-22 MED ORDER — SODIUM CHLORIDE 0.9 % IV SOLN
INTRAVENOUS | Status: DC
  Administered 2017-04-22: 8.1 [IU]/h via INTRAVENOUS
  Administered 2017-04-22: 11.8 [IU]/h via INTRAVENOUS
  Administered 2017-04-23: 6.2 [IU]/h via INTRAVENOUS
  Filled 2017-04-22 (×2): qty 1

## 2017-04-22 MED ORDER — DEXTROSE-NACL 5-0.45 % IV SOLN: INTRAVENOUS | Status: DC

## 2017-04-22 MED ORDER — PROPOFOL 1000 MG/100ML IV EMUL
5.0000 ug/kg/min | Freq: Once | INTRAVENOUS | Status: AC
  Administered 2017-04-22: 20 ug/kg/min via INTRAVENOUS

## 2017-04-22 MED ORDER — SODIUM CHLORIDE 0.9 % IV BOLUS (SEPSIS)
1000.0000 mL | Freq: Once | INTRAVENOUS | Status: AC
  Administered 2017-04-22: 1000 mL via INTRAVENOUS

## 2017-04-22 MED ORDER — MIDAZOLAM 50MG/50ML (1MG/ML) PREMIX INFUSION
INTRAVENOUS | Status: AC
  Filled 2017-04-22: qty 50

## 2017-04-22 MED ORDER — LEVETIRACETAM IN NACL 500 MG/100ML IV SOLN: 500.0000 mg | Freq: Three times a day (TID) | INTRAVENOUS | Status: DC

## 2017-04-22 MED ORDER — SODIUM CHLORIDE 0.9 % IV SOLN
INTRAVENOUS | Status: DC
  Administered 2017-04-22: 11:00:00 via INTRAVENOUS

## 2017-04-22 MED ORDER — PROPOFOL 1000 MG/100ML IV EMUL
5.0000 ug/kg/min | INTRAVENOUS | Status: DC
  Administered 2017-04-22: 70 ug/kg/min via INTRAVENOUS

## 2017-04-22 MED ORDER — LORAZEPAM 2 MG/ML IJ SOLN
INTRAMUSCULAR | Status: AC
  Administered 2017-04-22: 1 mg
  Filled 2017-04-22: qty 1

## 2017-04-22 MED ORDER — ONDANSETRON HCL 4 MG/2ML IJ SOLN: 4.0000 mg | Freq: Four times a day (QID) | INTRAMUSCULAR | Status: DC | PRN

## 2017-04-22 MED ORDER — ACETAMINOPHEN 650 MG RE SUPP
650.0000 mg | Freq: Four times a day (QID) | RECTAL | Status: DC | PRN
  Administered 2017-04-22 – 2017-04-23 (×3): 650 mg via RECTAL
  Filled 2017-04-22 (×3): qty 1

## 2017-04-22 MED ORDER — SODIUM CHLORIDE 0.9 % IV SOLN: INTRAVENOUS | Status: DC

## 2017-04-22 MED ORDER — PHENYLEPHRINE HCL-NACL 10-0.9 MG/250ML-% IV SOLN
INTRAVENOUS | Status: AC
  Filled 2017-04-22: qty 250

## 2017-04-22 MED ORDER — PROPOFOL 1000 MG/100ML IV EMUL
INTRAVENOUS | Status: AC
  Administered 2017-04-22: 70 ug/kg/min via INTRAVENOUS
  Filled 2017-04-22: qty 100

## 2017-04-22 MED ORDER — LEVETIRACETAM IN NACL 1000 MG/100ML IV SOLN
1000.0000 mg | Freq: Two times a day (BID) | INTRAVENOUS | Status: DC
  Administered 2017-04-22 – 2017-04-23 (×2): 1000 mg via INTRAVENOUS
  Filled 2017-04-22 (×3): qty 100

## 2017-04-22 MED ORDER — LEVETIRACETAM IN NACL 1000 MG/100ML IV SOLN
1000.0000 mg | Freq: Once | INTRAVENOUS | Status: AC
  Administered 2017-04-22: 1000 mg via INTRAVENOUS
  Filled 2017-04-22: qty 100

## 2017-04-22 MED ORDER — ENOXAPARIN SODIUM 40 MG/0.4ML ~~LOC~~ SOLN
40.0000 mg | SUBCUTANEOUS | Status: DC
  Administered 2017-04-22: 40 mg via SUBCUTANEOUS
  Filled 2017-04-22: qty 0.4

## 2017-04-22 MED ORDER — SENNOSIDES-DOCUSATE SODIUM 8.6-50 MG PO TABS: 1.0000 | ORAL_TABLET | Freq: Every evening | ORAL | Status: DC | PRN

## 2017-04-22 MED ORDER — SODIUM CHLORIDE 0.9 % IV SOLN
0.0000 ug/h | INTRAVENOUS | Status: DC
  Administered 2017-04-22: 30 ug/h via INTRAVENOUS
  Administered 2017-04-23: 200 ug/h via INTRAVENOUS
  Filled 2017-04-22 (×2): qty 50

## 2017-04-22 MED ORDER — SODIUM CHLORIDE 0.9 % IV SOLN
INTRAVENOUS | Status: DC
  Administered 2017-04-22: 5.4 [IU]/h via INTRAVENOUS
  Filled 2017-04-22: qty 1

## 2017-04-22 MED ORDER — SODIUM CHLORIDE 0.9 % IV BOLUS (SEPSIS)
2000.0000 mL | Freq: Once | INTRAVENOUS | Status: AC
  Administered 2017-04-22: 2000 mL via INTRAVENOUS

## 2017-04-22 MED ORDER — LORAZEPAM 2 MG/ML IJ SOLN: 2.0000 mg | INTRAMUSCULAR | Status: DC | PRN

## 2017-04-22 MED ORDER — SODIUM CHLORIDE 0.9 % IV SOLN
INTRAVENOUS | Status: DC
  Administered 2017-04-22: 17:00:00 via INTRAVENOUS

## 2017-04-22 MED ORDER — ACETAMINOPHEN 325 MG PO TABS: 650.0000 mg | ORAL_TABLET | Freq: Four times a day (QID) | ORAL | Status: DC | PRN

## 2017-04-22 MED ORDER — PROPOFOL 1000 MG/100ML IV EMUL
INTRAVENOUS | Status: AC
  Filled 2017-04-22: qty 100

## 2017-04-22 MED ORDER — SODIUM CHLORIDE 0.9 % IV SOLN
0.0000 ug/min | INTRAVENOUS | Status: DC
  Administered 2017-04-22: 20 ug/min via INTRAVENOUS
  Administered 2017-04-23: 40 ug/min via INTRAVENOUS
  Administered 2017-04-23 (×2): 50 ug/min via INTRAVENOUS
  Filled 2017-04-22 (×5): qty 1

## 2017-04-22 MED ORDER — INSULIN REGULAR BOLUS VIA INFUSION
0.0000 [IU] | Freq: Three times a day (TID) | INTRAVENOUS | Status: DC
  Filled 2017-04-22: qty 10

## 2017-04-22 MED ORDER — DEXTROSE-NACL 5-0.45 % IV SOLN
INTRAVENOUS | Status: DC
  Administered 2017-04-22 – 2017-04-23 (×3): via INTRAVENOUS

## 2017-04-22 MED ORDER — ETOMIDATE 2 MG/ML IV SOLN
20.0000 mg | Freq: Once | INTRAVENOUS | Status: AC
  Administered 2017-04-22: 20 mg via INTRAVENOUS

## 2017-04-22 NOTE — ED Notes (Signed)
CRITICAL VALUE ALERT  Critical Value:  Lactic Acid 2.0  Date & Time Notied:  2016/06/09 1414  Provider Notified: Dr. Ardyth HarpsHernandez  Orders Received/Actions taken: Hospitalist notified

## 2017-04-22 NOTE — ED Notes (Signed)
Of moving head and swallowing at this time. Diprivan increased at this time.

## 2017-04-22 NOTE — H&P (Signed)
History and Physical    Ricardo Rogers WUJ:[id number] DOB: January 22, 1971 DOA: 2017-05-06  Referring MD/NP/PA: Vanetta Mulders, EDP PCP: Mirna Mires, MD  Patient coming from: Home   Chief Complaint: Seizure  HPI: Ricardo Rogers is a 46 y.o. male who by the time I see him, is already intubated so I am unable to obtain any history from him.  History is obtained from chart, questioning the ED PA and ED RN.  Apparently his mother went to visit him today and found him to be a little confused and staggering around the house.  At one point he fell backwards and at that point EMS was activated.  During EMS transport he suffered 2 seizures, then became combative and aggressive.  RN reports that at some point after his arrival he was able to verbalize and answer questions and was able to tell them his name and where he was.  He subsequently had another seizure witnessed in the ED.  He was believed to have had a PEA arrest and CPR was initiated.  He was intubated.  During the process of intubation was noted to vomit and likely suffered an aspiration event.  He was found to have a CBG greater than 600.  Potassium is 3.1, creatinine of 1.54, WBCs of 18.9.  Tox screen was negative, alcohol level less than 10, UA negative for infection, does have a large amount of glucose.  Chest x-ray negative for pneumonia, CT head without acute intracranial abnormality but positive for acute pansinusitis.  Admission requested.  When I evaluated him in the ED he is sedated on a propofol drip, has hypotension with a systolic blood pressure anywhere between 60 and 80, has only received 1 L of IV fluids, he is significantly breathing over the vent.  Case was discussed with critical care medicine and the neurologist at [facility] and both have recommended transfer, however there are no beds available and decision was made to admit him transiently to the [facility] ICU until an ICU bed at [facility] opens up.  Dr. Juanetta Gosling and Dr. Gerilyn Pilgrim have  been consulted.  Past Medical/Surgical History: Past Medical History:  Diagnosis Date  . Asthma   . Diabetes mellitus without complication (HCC)   . HTN (hypertension)   . Seizures (HCC)     History reviewed. No pertinent surgical history.  Social History:  reports that  has never smoked. he has never used smokeless tobacco. He reports that he does not drink alcohol or use drugs.  Allergies: No Known Allergies  Family History:  Family History  Problem Relation Age of Onset  . Cancer Other   . Diabetes Other   . Hypertension Other     Prior to Admission medications   Medication Sig Start Date End Date Taking? Authorizing Provider  ADVAIR DISKUS 250-50 MCG/DOSE AEPB Inhale 1 puff into the lungs 2 (two) times daily as needed.  11/03/13   [provider]  cephALEXin (KEFLEX) 500 MG capsule Take 1 capsule (500 mg total) by mouth 4 (four) times daily. 06/08/15   Geoffery Lyons, MD  glipiZIDE (GLUCOTROL) 10 MG tablet Take 1 tablet (10 mg total) by mouth 2 (two) times daily before a meal. 10/03/14   Dione Booze, MD  HYDROcodone-acetaminophen [facility]) 5-325 MG tablet Take 1-2 tablets by mouth every 6 (six) hours as needed. 06/08/15   Geoffery Lyons, MD  lisinopril-hydrochlorothiazide (PRINZIDE,ZESTORETIC) 20-12.5 MG per tablet Take 1 tablet by mouth 2 (two) times daily. 10/03/14   Dione Booze, MD  metFORMIN (GLUCOPHAGE) 1000 MG tablet Take 1 tablet (1,000 mg total) by mouth 2 (two) times daily with a meal. 10/03/14   Dione BoozeGlick, David, MD    Review of Systems:  Unable to obtain as patient is currently critically ill and intubated   Physical Exam: Vitals:   06/02/17 1501 06/02/17 1515 06/02/17 1530 06/02/17 1545  BP:  98/68 120/76 (!) 134/94  Pulse:  (!) 122 (!) 126 (!) 131  Resp:  (!) 28 (!) 30 (!) 31  Temp: 98.9 F (37.2 C)     TempSrc: Axillary     SpO2:  93% 94% 93%  Weight: 89.6 kg (197 lb 8.5 oz)     Height: 5\' 10"  (1.778 m)        Constitutional: Sedated, intubated Eyes:  Pupils are nonreactive to light stimulus ENMT: Mucous membranes are dry.   Neck: normal, supple, no masses, no thyromegaly Respiratory: Coarse bilateral breath sounds, diffuse rhonchi Cardiovascular: Tachycardic, regular, no murmurs / rubs / gallops. No extremity edema. 2+ pedal pulses. No carotid bruits.  Abdomen: no tenderness, no masses palpated. No hepatosplenomegaly. Bowel sounds positive.  Musculoskeletal: no clubbing / cyanosis. No joint deformity upper and lower extremities. Good ROM, no contractures. Normal muscle tone.  Skin: no rashes, lesions, ulcers. No induration Neurologic: Unable to assess given current level of sedation Psychiatric: Unable to assess given current level of sedation   Labs on Admission: I have personally reviewed the following labs and imaging studies  CBC: Recent Labs  Lab 06/02/17 0908 06/02/17 0911  WBC 18.9*  --   NEUTROABS 13.9*  --   HGB 15.5 16.3  HCT 44.7 48.0  MCV 80.7  --   PLT 296  --    Basic Metabolic Panel: Recent Labs  Lab 06/02/17 0908 06/02/17 0911  NA 136 138  K 3.1* 3.8  CL 98* 101  CO2 22  --   GLUCOSE 560* 590*  BUN 15 18  CREATININE 1.54* 1.30*  CALCIUM 9.6  --    GFR: Estimated Creatinine Clearance: 78.2 mL/min (A) (by C-G formula based on SCr of 1.3 mg/dL (H)). Liver Function Tests: Recent Labs  Lab 06/02/17 0908  AST 25  ALT 26  ALKPHOS 114  BILITOT 0.8  PROT 8.4*  ALBUMIN 4.0   No results for input(s): LIPASE, AMYLASE in the last 168 hours. No results for input(s): AMMONIA in the last 168 hours. Coagulation Profile: No results for input(s): INR, PROTIME in the last 168 hours. Cardiac Enzymes: Recent Labs  Lab 06/02/17 0911  TROPONINI <0.03   BNP (last 3 results) No results for input(s): PROBNP in the last 8760 hours. HbA1C: No results for input(s): HGBA1C in the last 72 hours. CBG: Recent Labs  Lab 06/02/17 0932 06/02/17 1124 06/02/17 1236 06/02/17 1341 06/02/17 1501  GLUCAP >600*  >600* >600* 559* 458*   Lipid Profile: No results for input(s): CHOL, HDL, LDLCALC, TRIG, CHOLHDL, LDLDIRECT in the last 72 hours. Thyroid Function Tests: No results for input(s): TSH, T4TOTAL, FREET4, T3FREE, THYROIDAB in the last 72 hours. Anemia Panel: No results for input(s): VITAMINB12, FOLATE, FERRITIN, TIBC, IRON, RETICCTPCT in the last 72 hours. Urine analysis:    Component Value Date/Time   COLORURINE STRAW (A) 11/17/16 1024   APPEARANCEUR CLEAR 11/17/16 1024   LABSPEC 1.020 11/17/16 1024   PHURINE 5.0 11/17/16 1024   GLUCOSEU >=500 (A) 11/17/16 1024   HGBUR MODERATE (A) 11/17/16 1024   BILIRUBINUR NEGATIVE 11/17/16 1024   KETONESUR NEGATIVE 11/17/16 1024  PROTEINUR 100 (A) 01-15-17 1024   UROBILINOGEN 0.2 10/28/2014 0640   NITRITE NEGATIVE 01-15-17 1024   LEUKOCYTESUR NEGATIVE 01-15-17 1024   Sepsis Labs: @LABRCNTIP (procalcitonin:4,lacticidven:4) )No results found for this or any previous visit (from the past 240 hour(s)).   Radiological Exams on Admission: Ct Head Wo Contrast  Result Date: 01-15-17 CLINICAL DATA:  Seizure. Head trauma, minor, GCS>=13, high clinical risk, initial exam EXAM: CT HEAD WITHOUT CONTRAST TECHNIQUE: Contiguous axial images were obtained from the base of the skull through the vertex without intravenous contrast. COMPARISON:  10/28/2014 FINDINGS: Brain: No evidence of acute infarction, hemorrhage, hydrocephalus, extra-axial collection, or mass lesion/mass effect. Stable mild to moderate diffuse cerebral atrophy. Vascular:  No hyperdense vessel or other acute findings. Skull: No evidence of fracture or other significant bone abnormality. Sinuses/Orbits: Air-fluid levels seen throughout the visualized paranasal sinuses, consistent with acute sinusitis. Other: None. IMPRESSION: No acute intracranial abnormality.  Stable cerebral atrophy. Acute pansinusitis. Electronically Signed   By: Myles RosenthalJohn  Stahl M.D.   On: 01-15-17 11:13    Dg Chest Portable 1 View  Result Date: 01-15-17 CLINICAL DATA:  Seizure-like activity. Blood around the mouth. Combative. The patient underwent CPR and is now intubated. EXAM: PORTABLE CHEST 1 VIEW COMPARISON:  Chest x-ray of April 19, 2004 FINDINGS: The lungs are reasonably well inflated. The interstitial markings are increased. There is no pneumothorax or pleural effusion. The heart is normal in size. The pulmonary vascularity is indistinct. The endotracheal tube lies 4 cm above the carina. The esophagogastric tube tip and proximal port lie below the GE junction. The observed bony thorax is unremarkable. IMPRESSION: Borderline hypoinflation. Mild central pulmonary vascular congestion. No alveolar pneumonia. The endotracheal tube and esophagogastric tube are in reasonable position. Electronically Signed   By: David  SwazilandJordan M.D.   On: 01-15-17 10:00    EKG: Independently reviewed.  Sinus tachycardia at a rate of 119  Assessment/Plan Principal Problem:   Seizure (HCC) Active Problems:   Hyperosmolar (nonketotic) coma (HCC)   Acute on chronic respiratory failure with hypercapnia (HCC)   HTN (hypertension)   Hypotension    Seizure/status epilepticus -Requiring mechanical ventilation, did transiently receive CPR for what was thought to be PEA arrest. -EDP [facility] who recommended initiation of Keppra.  They are of the thought that patient's severe hyperglycemia might have precipitated status. -We will also place on as needed Ativan for seizure activity. -EEG requested -[facility]rology at [facility]  Acute hypoxic and hypercarbic respiratory failure -As a result of status epilepticus/cardiac arrest, doubt primary pulmonary pathology. -Patient was noted to vomit after resuscitation efforts, so likely aspirated and may indeed develop pneumonia in the future. -As of right now no antibiotics have been  initiated. -Is currently intubated and mechanically ventilated. -We will request post intubation ABG, Dr. Juanetta GoslingHawkins input has been requested for assistance with vent management.  Hyperosmolar coma -CBG greater than 600 on admission. -May have precipitated status epilepticus. -Has been started on IV insulin via glucose stabilizer protocol.  Hypotension -I suspect this is hypovolemic given degree of hyperglycemia as patient has only received less than a liter of fluids so far.  Also propofol may be playing a role. -Orders to give 2 L IV fluid bolus now, change propofol to fentanyl. -Doubt this is septic at this time, however blood and sputum cultures have been requested.   -Follow chest x-ray in a.m as patient was noted to aspirate in the ED.  DVT prophylaxis: Lovenox Code Status: Full code Family Communication: Patient only Disposition Plan: Transfer to [facility] ICU once bed available Consults called: Neurology, pulmonology, critical care medicine at [facility] (Dr. Wallace Cullens) Admission status: Inpatient   Critical care time spent: 105 minutes  Estela Philip Aspen MD [facility] Pager [phone]  If 7PM-7AM, please contact night-coverage [email or web address] Password [email or web address]  05/06/17, 3:53 PM

## 2017-04-22 NOTE — ED Notes (Signed)
Verbal order form Dr. Deretha EmoryZackowski to apply soft restraints for safety of tube placement.

## 2017-04-22 NOTE — ED Notes (Signed)
7.5 ET tube placed by EDP. Positive color changed. Ausculted lung sounds verified.

## 2017-04-22 NOTE — ED Notes (Signed)
Dr. Ardyth HarpsHernandez at bedside. Made her aware of pt's BP. Order given to d/c Propofol and start Fentanyl drip. No Fentanyl drip available in ED at this time. Ok to transfer to ICU while waiting on Fentanyl drip per Dr. Ardyth HarpsHernandez. Pharmacy aware pt is transferring to ICU at this time.

## 2017-04-22 NOTE — ED Provider Notes (Signed)
[facility] EMERGENCY DEPARTMENT Provider Note   CSN: [id number] Arrival date & time: 04/25/2017  4098     History   Chief Complaint Chief Complaint  Patient presents with  . Seizures    HPI Ricardo Rogers is a 46 y.o. male.  Patient brought in by EMS.  2 witnessed seizures 1 by his mother and one by them.  Patient seem to be postictal in between and got combative in route.  Patient reportedly spit blood on them.  Some question they lost a tooth.  Patient has a history of hypertension and diabetes.  Patient has a history of seizures in the past.  Not known whether he is on any antiseizure medication.      Past Medical History:  Diagnosis Date  . Asthma   . Diabetes mellitus without complication (HCC)   . HTN (hypertension)   . Seizures [facility])     Patient Active Problem List   Diagnosis Date Noted  . Seizure (HCC) 04/06/2017  . Seizures (HCC) 08/20/2012  . Hyperglycemia 08/20/2012  . Unspecified asthma(493.90) 08/20/2012  . Elevated transaminase level 08/20/2012  . HTN (hypertension)     History reviewed. No pertinent surgical history.     Home Medications    Prior to Admission medications   Medication Sig Start Date End Date Taking? Authorizing Provider  ADVAIR DISKUS 250-50 MCG/DOSE AEPB Inhale 1 puff into the lungs 2 (two) times daily as needed.  11/03/13   [provider]  cephALEXin (KEFLEX) 500 MG capsule Take 1 capsule (500 mg total) by mouth 4 (four) times daily. 06/08/15   Geoffery Lyons, MD  glipiZIDE (GLUCOTROL) 10 MG tablet Take 1 tablet (10 mg total) by mouth 2 (two) times daily before a meal. 10/03/14   Dione Booze, MD  HYDROcodone-acetaminophen [facility]) 5-325 MG tablet Take 1-2 tablets by mouth every 6 (six) hours as needed. 06/08/15   Geoffery Lyons, MD  lisinopril-hydrochlorothiazide (PRINZIDE,ZESTORETIC) 20-12.5 MG per tablet Take 1 tablet by mouth 2 (two) times daily. 10/03/14   Dione Booze, MD  metFORMIN (GLUCOPHAGE) 1000 MG tablet Take 1 tablet  (1,000 mg total) by mouth 2 (two) times daily with a meal. 10/03/14   Dione Booze, MD    Family History Family History  Problem Relation Age of Onset  . Cancer Other   . Diabetes Other   . Hypertension Other     Social History Social History   Tobacco Use  . Smoking status: Never Smoker  . Smokeless tobacco: Never Used  Substance Use Topics  . Alcohol use: No  . Drug use: No     Allergies   Patient has no known allergies.   Review of Systems Review of Systems  Unable to perform ROS: Patient unresponsive     Physical Exam Updated Vital Signs BP 108/77   Pulse (!) 123   Resp (!) 29   Ht 1.778 m (5\' 10" )   Wt 90.7 kg (200 lb)   SpO2 90%   BMI 28.70 kg/m   Physical Exam  Constitutional: He appears well-developed and well-nourished. He appears distressed.  HENT:  Head: Normocephalic and atraumatic.  Mucous membranes dry.  Patient was missing his 2 upper incisors.  1 of the gums with bleeding.  Feel that there may have been recent loss of tooth.  This was present before as doing the intubation.  Apparently EMS also reported that patient had been spitting blood on them.  Eyes: Conjunctivae are normal. Pupils are equal, round, and reactive to light.  Neck: Neck supple.  Cardiovascular:  Tachycardic  Pulmonary/Chest: He is in respiratory distress.  Abdominal: Soft. Bowel sounds are normal. He exhibits distension.  Neurological:  Patient unresponsive.  Was moving all extremities spontaneously however.  Skin: Skin is warm.  Nursing note and vitals reviewed.    ED Treatments / Results  Labs (all labs ordered are listed, but only abnormal results are displayed) Labs Reviewed  CBC WITH DIFFERENTIAL/PLATELET - Abnormal; Notable for the following components:      Result Value   WBC 18.9 (*)    Neutro Abs 13.9 (*)    All other components within normal limits  COMPREHENSIVE METABOLIC PANEL - Abnormal; Notable for the following components:   Potassium 3.1 (*)     Chloride 98 (*)    Glucose, Bld 560 (*)    Creatinine, Ser 1.54 (*)    Total Protein 8.4 (*)    GFR calc non Af Amer 52 (*)    GFR calc Af Amer 60 (*)    Anion gap 16 (*)    All other components within normal limits  URINALYSIS, ROUTINE W REFLEX MICROSCOPIC - Abnormal; Notable for the following components:   Color, Urine STRAW (*)    Glucose, UA >=500 (*)    Hgb urine dipstick MODERATE (*)    Protein, ur 100 (*)    Bacteria, UA RARE (*)    Squamous Epithelial / LPF 0-5 (*)    All other components within normal limits  BLOOD GAS, ARTERIAL - Abnormal; Notable for the following components:   pH, Arterial 7.234 (*)    pCO2 arterial 60.5 (*)    pO2, Arterial 309 (*)    All other components within normal limits  LACTIC ACID, PLASMA - Abnormal; Notable for the following components:   Lactic Acid, Venous 2.0 (*)    All other components within normal limits  CBG MONITORING, ED - Abnormal; Notable for the following components:   Glucose-Capillary >600 (*)    All other components within normal limits  CBG MONITORING, ED - Abnormal; Notable for the following components:   Glucose-Capillary >600 (*)    All other components within normal limits  I-STAT CHEM 8, ED - Abnormal; Notable for the following components:   Creatinine, Ser 1.30 (*)    Glucose, Bld 590 (*)    All other components within normal limits  CBG MONITORING, ED - Abnormal; Notable for the following components:   Glucose-Capillary >600 (*)    All other components within normal limits  CBG MONITORING, ED - Abnormal; Notable for the following components:   Glucose-Capillary 559 (*)    All other components within normal limits  RAPID URINE DRUG SCREEN, HOSP PERFORMED  ETHANOL  TROPONIN I    EKG  EKG Interpretation  Date/Time:  Monday April 22 2017 09:10:18 EST Ventricular Rate:  133 PR Interval:    QRS Duration: 87 QT Interval:  299 QTC Calculation: 445 R Axis:   66 Text Interpretation:  Sinus tachycardia LAE,  consider biatrial enlargement Borderline repolarization abnormality Confirmed by Vanetta MuldersZackowski, Kannon Granderson [phone] on 10-10-2016 10:22:22 AM       Radiology Ct Head Wo Contrast  Result Date: 10-10-2016 CLINICAL DATA:  Seizure. Head trauma, minor, GCS>=13, high clinical risk, initial exam EXAM: CT HEAD WITHOUT CONTRAST TECHNIQUE: Contiguous axial images were obtained from the base of the skull through the vertex without intravenous contrast. COMPARISON:  10/28/2014 FINDINGS: Brain: No evidence of acute infarction, hemorrhage, hydrocephalus, extra-axial collection, or mass lesion/mass effect. Stable mild to  moderate diffuse cerebral atrophy. Vascular:  No hyperdense vessel or other acute findings. Skull: No evidence of fracture or other significant bone abnormality. Sinuses/Orbits: Air-fluid levels seen throughout the visualized paranasal sinuses, consistent with acute sinusitis. Other: None. IMPRESSION: No acute intracranial abnormality.  Stable cerebral atrophy. Acute pansinusitis. Electronically Signed   By: Myles Rosenthal M.D.   On: 05/20/2017 11:13   Dg Chest Portable 1 View  Result Date: May 20, 2017 CLINICAL DATA:  Seizure-like activity. Blood around the mouth. Combative. The patient underwent CPR and is now intubated. EXAM: PORTABLE CHEST 1 VIEW COMPARISON:  Chest x-ray of April 19, 2004 FINDINGS: The lungs are reasonably well inflated. The interstitial markings are increased. There is no pneumothorax or pleural effusion. The heart is normal in size. The pulmonary vascularity is indistinct. The endotracheal tube lies 4 cm above the carina. The esophagogastric tube tip and proximal port lie below the GE junction. The observed bony thorax is unremarkable. IMPRESSION: Borderline hypoinflation. Mild central pulmonary vascular congestion. No alveolar pneumonia. The endotracheal tube and esophagogastric tube are in reasonable position. Electronically Signed   By: David  [place] M.D.   On: 05-20-17 10:00     Procedures Procedure Name: Intubation Date/Time: May 20, 2017 9:30 AM Performed by: Vanetta Mulders, MD Pre-anesthesia Checklist: Emergency Drugs available and Suction available Oxygen Delivery Method: Ambu bag Preoxygenation: Pre-oxygenation with 100% oxygen Induction Type: Rapid sequence Ventilation: Mask ventilation without difficulty and Two handed mask ventilation required Laryngoscope Size: Glidescope Tube size: 7.5 mm Placement Confirmation: ETT inserted through vocal cords under direct vision and Breath sounds checked- equal and bilateral Secured at: 24 cm Tube secured with: ETT holder         CRITICAL CARE Performed by: Vanetta Mulders Total critical care time: 60 minutes Critical care time was exclusive of separately billable procedures and treating other patients. Critical care was necessary to treat or prevent imminent or life-threatening deterioration. Critical care was time spent personally by me on the following activities: development of treatment plan with patient and/or surrogate as well as nursing, discussions with consultants, evaluation of patient's response to treatment, examination of patient, obtaining history from patient or surrogate, ordering and performing treatments and interventions, ordering and review of laboratory studies, ordering and review of radiographic studies, pulse oximetry and re-evaluation of patient's condition.   Medications Ordered in ED Medications  dextrose 5 %-0.45 % sodium chloride infusion (not administered)  insulin regular (NOVOLIN R,HUMULIN R) 100 Units in sodium chloride 0.9 % 100 mL (1 Units/mL) infusion (15 Units/hr Intravenous Rate/Dose Change 05-20-17 1346)  0.9 %  sodium chloride infusion ( Intravenous New Bag/Given 05/20/17 1030)  propofol (DIPRIVAN) 1000 MG/100ML infusion (60 mcg/kg/min  90.7 kg Intravenous Rate/Dose Change 2017/05/20 1330)  LORazepam (ATIVAN) 2 MG/ML injection (1 mg  Given May 20, 2017 0912)   LORazepam (ATIVAN) 2 MG/ML injection (1 mg  Given 05/20/17 0919)  propofol (DIPRIVAN) 1000 MG/100ML infusion (0 mcg/kg/min  90.7 kg Intravenous Stopped 05-20-2017 1242)  lidocaine (cardiac) 100 mg/25ml (XYLOCAINE) 20 MG/ML injection 2% 100 mg (100 mg Intravenous Given 05-20-17 0920)  etomidate (AMIDATE) injection 20 mg (20 mg Intravenous Given 05/20/2017 0921)  succinylcholine (ANECTINE) injection 100 mg (100 mg Intravenous Given May 20, 2017 0922)  succinylcholine (ANECTINE) injection 100 mg (100 mg Intravenous Given 05-20-17 0931)  levETIRAcetam (KEPPRA) IVPB 1000 mg/100 mL premix (0 mg Intravenous Stopped 05/20/17 1317)     Initial Impression / Assessment and Plan / ED Course  I have reviewed the triage vital signs and the nursing notes.  Pertinent labs & imaging  results that were available during my care of the patient were reviewed by me and considered in my medical decision making (see chart for details).     Was able to get a hold of Dr. Gerilyn Pilgrimoonquah from neurology.  He will consult on patient.  Patient will be admitted here since [facility].  Hospitalist will admit.   Patient brought him in by EMS for persistent seizure activity.  Patient has a history of diabetes and hypertension according to family members does not take his medicine.  Patient had a history of seizure a year ago which I thought was related to ischemia.  Patient not on antiseizure medication.  Patient shortly after arrival here had a third witnessed seizure.  In between he had fairly postictal phase but when he first arrived he was able to verbalize to our physician assistant his name.  Then had the third seizure and then ran into breathing difficulties and was not nearly as awake.  Therefore requiring intubation.  Patient received Ativan 2 mg prior to intubation.  Patient received lidocaine and also received etomidate and succinylcholine.  Patient's intubation was not complicated by visualization.  But he  started to vomit.  Large amounts of like a grape juice type fluid came up family later told us that he drinks grape soda constantly.'s nondiet.  We knew his blood sugars were high.  Patient was eventually intubated we had to put an NG tube down.  There is no doubt that he aspirated.   Patient did require second dose of succinylcholine for the intubation.   Patient started on glucose stabilizer.  Patient started on Keppra received 1 g.  [facility] due to the possibility of persistent seizing.  Also discussed with critical care they wanted him admitted there but there were no beds.  So on compromise patient had to be admitted here.  Discussed with Dr. Gerilyn Pilgrimoonquah our local neurologist.  He was planning an EEG for later today.  Patient clearly ICU admission.  Most likely an neuro hospitalist also felt that everything was related to his high blood sugars.  And that probably caused the seizure activity.  Therefore with fluids in the IV insulin think should improve.  Blood gas done shortly after intubation showed elevated PCO2 no surprise acidosis as well.  Probably a combination of respiratory and metabolic acidosis.  Patient been noncompliant on his medications so no telling how long the blood sugar has been high.  Initial chest x-ray without any acute findings however high concern for aspiration pneumonia developing.  In addition patient had an episode of what they thought was cardiac arrest.  He bradycardia down to about 30 bpm.  They could not find a pulse repeat CPR was started.  This is when I entered the room the first time and patient's heart rate quickly came back up to 111 and we get a strong radial pulse.  So I think this may have been a vasovagal component.  Also perhaps related to his airway failure.  Final Clinical Impressions(s) / ED Diagnoses   Final diagnoses:  Seizure (HCC)  Hyperglycemia  Acute respiratory failure with hypoxia and  hypercapnia (HCC)  Acidosis    ED Discharge Orders    None       Vanetta MuldersZackowski, Alexio Sroka, MD 05/02/[phone] [id number]

## 2017-04-22 NOTE — ED Triage Notes (Addendum)
Patient brought in by [facility] for seizure like activity noted by mother. Patient was combative with EMS. Blood noted to mouth. Currently having to be restrained by staff due to patient trying to get out of bed. Per EMS patient could have possibly fallen during during seizure.  CBG 438.  Trisha MangleKaren Sophia at bedside at this time.

## 2017-04-22 NOTE — ED Notes (Signed)
Patients eyes opening, EDP aware. Verbal orders obtained to increase Diprivan.

## 2017-04-22 NOTE — ED Notes (Signed)
Patient having seizure at this time.

## 2017-04-22 NOTE — ED Notes (Signed)
Heart rate 34 at this time. CPR starting, EDP made aware.

## 2017-04-22 NOTE — ED Notes (Addendum)
Dr. Mellody LifeZackowsi intubating at this time. Clydie BraunKaren sophia at bedside at this time.

## 2017-04-22 NOTE — ED Notes (Signed)
Oral Suctioning patient at this time.

## 2017-04-22 NOTE — ED Notes (Signed)
Unable to get PIV after multiple attempts by 3 different RNs. EDP notified and gave order to stop insulin drip long enough for pt to receive Keppra IV dose (approximately 15 mins) and then restart insulin drip afterwards.

## 2017-04-22 NOTE — Progress Notes (Addendum)
eLink Physician-Brief Progress Note Patient Name: Raynelle HighlandJohn W Sheehan DOB: 1970-07-03 MRN: [id number]   Date of Service  04/28/2017  HPI/Events of Note  Hypoxia - Sat = 86% by oximetry. Ventilator settings = 100%/PRVC 14/TV 550/P 5. Ppeak have been stable at 24. Admitted d/t seizure. Aspiration likely given difficulty oxygenating the patient.   eICU Interventions  Will order: 1. Increase PEEP to 10.  2. Saline lavage and extra Xopenex neb Rx now. 3. Portable CXR STAT.     Intervention Category Major Interventions: Hypoxemia - evaluation and management  Maciah Schweigert Eugene 04/06/2017, 11:47 PM

## 2017-04-22 NOTE — ED Notes (Signed)
Pulse palpated, strong by Dr. Mellody LifeZackowsi. CPR stopped at this time. Bagging at this time.

## 2017-04-22 NOTE — Consult Note (Signed)
Full note to follow. He was intubated after seizures and CPR. Has asthma. Non compliant with meds. Didn't tolerate propuful. Heart rate still up on Fentanyl. Add versed and steroids and xopenex.[facility].

## 2017-04-22 NOTE — Progress Notes (Signed)
eLink Physician-Brief Progress Note Patient Name: Ricardo HighlandJohn W Cavell DOB: 06/04/1971 MRN: [id number]   Date of Service  Dec 28, 2016  HPI/Events of Note  Hypotension - BP = 85/68. Patient has PICC line. HR = 139 - Sinus Rhythm.  eICU Interventions  Will order: 1. Bolus with 0.9 NaCl 1 liter IV over 1 hour now.  2. Monitor CVP. 3. If isotonic fluid bolus doesn't maintain BP, Phenylephrine IV infusion. Titrate to MAP >= 65.      Intervention Category Major Interventions: Hypotension - evaluation and management  Lenell AntuSommer,Steven Eugene Dec 28, 2016, 8:39 PM

## 2017-04-22 NOTE — ED Notes (Signed)
[facility], as requested by Dr. Deretha EmoryZackowski.  Per their dispatcher, "Tresa EndoKelly will be here around 4 or a4:30".  Nurse informed.

## 2017-04-22 NOTE — Consult Note (Signed)
Ricardo A. Merlene Laughter, MD     [email or web address]          Ricardo Rogers is an 46 y.o. male.   ASSESSMENT/PLAN: 1. Resolves status epilepticus: Agree with low-dose of Keppra. I will go ahead and start the patient on the maintenance dose. An EEG has been arranged pending transfer to [facility].  2. 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 Severe hyperglycemia with a history of noncompliance:     Patient is a 45 year old black male who had a seizure over a year ago. This occurred in the setting of marked hyperglycemia. He was not placed on antiepileptic medication at that time because of the suspicion that the seizures were provoked by hyperglycemia. The patient presented with another seizure today and while en route had a few more seizures. He had a couple more in the emergency room associated with brief cardio-pulmonary arrest requiring urgent intubation. He also had emesis which likely contributed to their history distress. Blood sugars as been recorded over 600. Patient is currently quite heavily sedated with 100 mics of fentanyl and when necessary doses of Versed. He has been hypertensive and tachycardic in the 150s. He probably has become agitated and restless when his sedation has been reduced.      GENERAL: He is intubated and and the heavily sedated.   HEENT: Supple  ABDOMEN: soft  EXTREMITIES: No edema   BACK: normal   SKIN: Normal by inspection.    MENTAL STATUS: unresponsive to deep painful stimuli   CRANIAL NERVES: Pupils are mid position and fixed. He does cough and breathe spontaneously over the event. Ocular cephalic reflexes are markedly diminished. Corneal reflexes are absent.  MOTOR: No response to painful stimuli  COORDINATION: No evidence of tremors or dysmetria no parkinsonism  REFLEXES: Deep tendon reflexes are symmetrical and normal. Babinski reflexes are flexor  bilaterally.   SENSATION:  no response to deep painful stimuli.    Blood pressure 105/61, pulse (!) 142, temperature 98.9 F (37.2 C), temperature source Axillary, resp. rate (!) 30, height _0  (1.778 m), weight 197 lb 8.5 oz (89.6 kg), SpO2 (!) 89 %.  Past Medical History:  Diagnosis Date  . Asthma   . Diabetes mellitus without complication ([place])   . HTN (hypertension)   . Seizures ([place])     History reviewed. No pertinent surgical history.  Family History  Problem Relation Age of Onset  . Cancer Other   . Diabetes Other   . Hypertension Other     Social History:  reports that  has never smoked. he has never used smokeless tobacco. He reports that he does not drink alcohol or use drugs.  Allergies: No Known Allergies  Medications: Prior to Admission medications   Medication Sig Start Date End Date Taking? Authorizing Provider  ADVAIR DISKUS 250-50 MCG/DOSE AEPB Inhale 1 puff into the lungs 2 (two) times daily as needed.  11/03/13   [provider]  cephALEXin (KEFLEX) 500 MG capsule Take 1 capsule (500 mg total) by mouth 4 (four) times daily. 06/08/15   Veryl Speak, MD  glipiZIDE (GLUCOTROL) 10 MG tablet Take 1 tablet (10 mg total) by mouth 2 (two) times daily before a meal. 02/08/28   Delora Fuel, MD  HYDROcodone-acetaminophen [facility]) 5-325 MG tablet Take 1-2 tablets by mouth every 6 (six) hours as needed. 06/08/15   Veryl Speak, MD  lisinopril-hydrochlorothiazide (PRINZIDE,ZESTORETIC) 20-12.5 MG per tablet Take 1 tablet by mouth 2 (two) times  daily. 11/08/32   Delora Fuel, MD  metFORMIN (GLUCOPHAGE) 1000 MG tablet Take 1 tablet (1,000 mg total) by mouth 2 (two) times daily with a meal. 06/13/35   Delora Fuel, MD    Scheduled Meds: . enoxaparin (LOVENOX) injection  40 mg Subcutaneous Q24H  . insulin regular  0-10 Units Intravenous TID WC  . levalbuterol  0.63 mg Nebulization Q6H  . methylPREDNISolone (SOLU-MEDROL) injection  40 mg Intravenous Q6H   Continuous  Infusions: . sodium chloride 100 mL/hr at 04/21/2017 1657  . sodium chloride    . dextrose 5 % and 0.45% NaCl Stopped (04/15/2017 1544)  . fentaNYL infusion INTRAVENOUS 30 mcg/hr (04/17/2017 1545)  . insulin (NOVOLIN-R) infusion 13 Units/hr (05/01/2017 1702)  . levETIRAcetam     PRN Meds:.acetaminophen **OR** acetaminophen, dextrose, midazolam, ondansetron **OR** ondansetron (ZOFRAN) IV, senna-docusate     Results for orders placed or performed during the hospital encounter of 04/17/2017 (from the past 48 hour(s))  CBC WITH DIFFERENTIAL     Status: Abnormal   Collection Time: 05/02/2017  9:08 AM  Result Value Ref Range   WBC 18.9 (H) 4.0 - 10.5 K/uL   RBC 5.54 4.22 - 5.81 MIL/uL   Hemoglobin 15.5 13.0 - 17.0 g/dL   HCT 44.7 39.0 - 52.0 %   MCV 80.7 78.0 - 100.0 fL   MCH 28.0 26.0 - 34.0 pg   MCHC 34.7 30.0 - 36.0 g/dL   RDW 13.7 11.5 - 15.5 %   Platelets 296 150 - 400 K/uL   Neutrophils Relative % 74 %   Neutro Abs 13.9 (H) 1.7 - 7.7 K/uL   Lymphocytes Relative 21 %   Lymphs Abs 4.0 0.7 - 4.0 K/uL   Monocytes Relative 5 %   Monocytes Absolute 0.9 0.1 - 1.0 K/uL   Eosinophils Relative 0 %   Eosinophils Absolute 0.1 0.0 - 0.7 K/uL   Basophils Relative 0 %   Basophils Absolute 0.0 0.0 - 0.1 K/uL  Comprehensive metabolic panel     Status: Abnormal   Collection Time: 04/25/2017  9:08 AM  Result Value Ref Range   Sodium 136 135 - 145 mmol/L   Potassium 3.1 (L) 3.5 - 5.1 mmol/L   Chloride 98 (L) 101 - 111 mmol/L   CO2 22 22 - 32 mmol/L   Glucose, Bld 560 (HH) 65 - 99 mg/dL    Comment: CRITICAL RESULT CALLED TO, READ BACK BY AND VERIFIED WITH: WHITE,M AT 9:456AM ON 04/30/2017 BY FESTERMAN,C    BUN 15 6 - 20 mg/dL   Creatinine, Ser 1.54 (H) 0.61 - 1.24 mg/dL   Calcium 9.6 8.9 - 10.3 mg/dL   Total Protein 8.4 (H) 6.5 - 8.1 g/dL   Albumin 4.0 3.5 - 5.0 g/dL   AST 25 15 - 41 U/L   ALT 26 17 - 63 U/L   Alkaline Phosphatase 114 38 - 126 U/L   Total Bilirubin 0.8 0.3 - 1.2 mg/dL   GFR calc  non Af Amer 52 (L) >60 mL/min   GFR calc Af Amer 60 (L) >60 mL/min    Comment: (NOTE) The eGFR has been calculated using the CKD EPI equation. This calculation has not been validated in all clinical situations. eGFR's persistently <60 mL/min signify possible Chronic Kidney Disease.    Anion gap 16 (H) 5 - 15  Ethanol     Status: None   Collection Time: 04/14/2017  9:08 AM  Result Value Ref Range   Alcohol, Ethyl (B) <10 <10 mg/dL  Comment:        LOWEST DETECTABLE LIMIT FOR SERUM ALCOHOL IS 10 mg/dL FOR MEDICAL PURPOSES ONLY   I-stat chem 8, ed     Status: Abnormal   Collection Time: 04/05/2017  9:11 AM  Result Value Ref Range   Sodium 138 135 - 145 mmol/L   Potassium 3.8 3.5 - 5.1 mmol/L   Chloride 101 101 - 111 mmol/L   BUN 18 6 - 20 mg/dL   Creatinine, Ser 1.30 (H) 0.61 - 1.24 mg/dL   Glucose, Bld 590 (HH) 65 - 99 mg/dL   Calcium, Ion 1.20 1.15 - 1.40 mmol/L   TCO2 27 22 - 32 mmol/L   Hemoglobin 16.3 13.0 - 17.0 g/dL   HCT 48.0 39.0 - 52.0 %  Troponin I     Status: None   Collection Time: 05/01/2017  9:11 AM  Result Value Ref Range   Troponin I <0.03 <0.03 ng/mL  CBG monitoring, ED     Status: Abnormal   Collection Time: 04/09/2017  9:32 AM  Result Value Ref Range   Glucose-Capillary >600 (HH) 65 - 99 mg/dL  Urinalysis, Routine w reflex microscopic     Status: Abnormal   Collection Time: 04/18/2017 10:24 AM  Result Value Ref Range   Color, Urine STRAW (A) YELLOW   APPearance CLEAR CLEAR   Specific Gravity, Urine 1.020 1.005 - 1.030   pH 5.0 5.0 - 8.0   Glucose, UA >=500 (A) NEGATIVE mg/dL   Hgb urine dipstick MODERATE (A) NEGATIVE   Bilirubin Urine NEGATIVE NEGATIVE   Ketones, ur NEGATIVE NEGATIVE mg/dL   Protein, ur 100 (A) NEGATIVE mg/dL   Nitrite NEGATIVE NEGATIVE   Leukocytes, UA NEGATIVE NEGATIVE   RBC / HPF 0-5 0 - 5 RBC/hpf   WBC, UA 0-5 0 - 5 WBC/hpf   Bacteria, UA RARE (A) NONE SEEN   Squamous Epithelial / LPF 0-5 (A) NONE SEEN  Rapid urine drug screen  (hospital performed)     Status: None   Collection Time: 04/24/2017 10:24 AM  Result Value Ref Range   Opiates NONE DETECTED NONE DETECTED   Cocaine NONE DETECTED NONE DETECTED   Benzodiazepines NONE DETECTED NONE DETECTED   Amphetamines NONE DETECTED NONE DETECTED   Tetrahydrocannabinol NONE DETECTED NONE DETECTED   Barbiturates NONE DETECTED NONE DETECTED    Comment:        DRUG SCREEN FOR MEDICAL PURPOSES ONLY.  IF CONFIRMATION IS NEEDED FOR ANY PURPOSE, NOTIFY LAB WITHIN 5 DAYS.        LOWEST DETECTABLE LIMITS FOR URINE DRUG SCREEN Drug Class       Cutoff (ng/mL) Amphetamine      1000 Barbiturate      200 Benzodiazepine   169 Tricyclics       678 Opiates          300 Cocaine          300 THC              50   Blood gas, arterial ([facility] & [facility] ONLY)     Status: Abnormal   Collection Time: 05/03/2017 10:24 AM  Result Value Ref Range   FIO2 1.00    O2 Content 100.0 L/min   Delivery systems VENTILATOR    Mode PRESSURE REGULATED VOLUME CONTROL    VT 550 mL   LHR 14 resp/min   Peep/cpap 5.0 cm H20   pH, Arterial 7.234 (L) 7.350 - 7.450   pCO2 arterial 60.5 (H) 32.0 - 48.0  mmHg   pO2, Arterial 309 (H) 83.0 - 108.0 mmHg   Bicarbonate 21.4 20.0 - 28.0 mmol/L   Acid-base deficit 1.9 0.0 - 2.0 mmol/L   O2 Saturation 99.2 %   Collection site RIGHT RADIAL    Drawn by [phone]    Sample type ARTERIAL    Allens test (pass/fail) PASS PASS  CBG monitoring, ED     Status: Abnormal   Collection Time: 04/21/2017 11:24 AM  Result Value Ref Range   Glucose-Capillary >600 (HH) 65 - 99 mg/dL  CBG monitoring, ED     Status: Abnormal   Collection Time: 05/02/2017 12:36 PM  Result Value Ref Range   Glucose-Capillary >600 (HH) 65 - 99 mg/dL  Lactic acid, plasma     Status: Abnormal   Collection Time: 04/13/2017 12:42 PM  Result Value Ref Range   Lactic Acid, Venous 2.0 (HH) 0.5 - 1.9 mmol/L    Comment: CRITICAL RESULT CALLED TO, READ BACK BY AND VERIFIED WITH: WHITE,M AT 1340 ON 11.19.2018 BY  ISLEY,B   CBG monitoring, ED     Status: Abnormal   Collection Time: 04/10/2017  1:41 PM  Result Value Ref Range   Glucose-Capillary 559 (HH) 65 - 99 mg/dL   Comment 1 Document in Chart   Glucose, capillary     Status: Abnormal   Collection Time: 04/26/2017  3:01 PM  Result Value Ref Range   Glucose-Capillary 458 (H) 65 - 99 mg/dL   Comment 1 Notify RN    Comment 2 Document in Chart   MRSA PCR Screening     Status: None   Collection Time: 04/21/2017  3:32 PM  Result Value Ref Range   MRSA by PCR NEGATIVE NEGATIVE    Comment:        The GeneXpert MRSA Assay (FDA approved for NASAL specimens only), is one component of a comprehensive MRSA colonization surveillance program. It is not intended to diagnose MRSA infection nor to guide or monitor treatment for MRSA infections.   Glucose, capillary     Status: Abnormal   Collection Time: 04/16/2017  3:52 PM  Result Value Ref Range   Glucose-Capillary 440 (H) 65 - 99 mg/dL  Blood gas, arterial     Status: Abnormal   Collection Time: 04/09/2017  4:45 PM  Result Value Ref Range   FIO2 70.00    Delivery systems VENTILATOR    Mode PRESSURE REGULATED VOLUME CONTROL    VT 550 mL   LHR 14 resp/min   Peep/cpap 5.0 cm H20   pH, Arterial 7.309 (L) 7.350 - 7.450   pCO2 arterial 40.7 32.0 - 48.0 mmHg   pO2, Arterial 60.0 (L) 83.0 - 108.0 mmHg   Bicarbonate 19.7 (L) 20.0 - 28.0 mmol/L   Acid-base deficit 5.3 (H) 0.0 - 2.0 mmol/L   O2 Saturation 88.5 %   Patient temperature 37.0    Collection site RIGHT RADIAL    Drawn by [phone]    Sample type ARTERIAL DRAW    Allens test (pass/fail) PASS PASS  Glucose, capillary     Status: Abnormal   Collection Time: 04/21/2017  5:00 PM  Result Value Ref Range   Glucose-Capillary 386 (H) 65 - 99 mg/dL  Glucose, capillary     Status: Abnormal   Collection Time: 04/05/2017  6:02 PM  Result Value Ref Range   Glucose-Capillary 265 (H) 65 - 99 mg/dL  Glucose, capillary     Status: Abnormal   Collection Time:  04/18/2017  6:47 PM  Result  Value Ref Range   Glucose-Capillary 195 (H) 65 - 99 mg/dL    Studies/Results:     Lynden Carrithers A. Merlene Rogers, M.D.  Diplomate, Tax adviser of Psychiatry and Neurology ( Neurology). 05/02/2017, 7:26 PM

## 2017-04-23 ENCOUNTER — Inpatient Hospital Stay (HOSPITAL_COMMUNITY): Payer: BLUE CROSS/BLUE SHIELD

## 2017-04-23 DIAGNOSIS — R569 Unspecified convulsions: Secondary | ICD-10-CM

## 2017-04-23 DIAGNOSIS — E1111 Type 2 diabetes mellitus with ketoacidosis with coma: Secondary | ICD-10-CM

## 2017-04-23 LAB — BLOOD CULTURE ID PANEL (REFLEXED)
Acinetobacter baumannii: NOT DETECTED
CANDIDA ALBICANS: NOT DETECTED
Candida glabrata: NOT DETECTED
Candida krusei: NOT DETECTED
Candida parapsilosis: NOT DETECTED
Candida tropicalis: NOT DETECTED
ENTEROBACTERIACEAE SPECIES: NOT DETECTED
ESCHERICHIA COLI: NOT DETECTED
Enterobacter cloacae complex: NOT DETECTED
Enterococcus species: NOT DETECTED
HAEMOPHILUS INFLUENZAE: NOT DETECTED
KLEBSIELLA OXYTOCA: NOT DETECTED
KLEBSIELLA PNEUMONIAE: NOT DETECTED
LISTERIA MONOCYTOGENES: NOT DETECTED
METHICILLIN RESISTANCE: NOT DETECTED
Neisseria meningitidis: NOT DETECTED
PSEUDOMONAS AERUGINOSA: NOT DETECTED
Proteus species: NOT DETECTED
STAPHYLOCOCCUS AUREUS BCID: NOT DETECTED
STREPTOCOCCUS AGALACTIAE: NOT DETECTED
STREPTOCOCCUS PNEUMONIAE: NOT DETECTED
STREPTOCOCCUS SPECIES: NOT DETECTED
Serratia marcescens: NOT DETECTED
Staphylococcus species: DETECTED — AB
Streptococcus pyogenes: NOT DETECTED

## 2017-04-23 LAB — POCT I-STAT 3, ART BLOOD GAS (G3+)
ACID-BASE DEFICIT: 7 mmol/L — AB (ref 0.0–2.0)
ACID-BASE DEFICIT: 7 mmol/L — AB (ref 0.0–2.0)
BICARBONATE: 22.4 mmol/L (ref 20.0–28.0)
BICARBONATE: 20.8 mmol/L (ref 20.0–28.0)
O2 SAT: 96 %
O2 Saturation: 94 %
PO2 ART: 94 mmHg (ref 83.0–108.0)
PO2 ART: 102 mmHg (ref 83.0–108.0)
Patient temperature: 99.5
Patient temperature: 100.3
TCO2: 24 mmol/L (ref 22–32)
TCO2: 22 mmol/L (ref 22–32)
pCO2 arterial: 66.2 mmHg (ref 32.0–48.0)
pCO2 arterial: 53.4 mmHg — ABNORMAL HIGH (ref 32.0–48.0)
pH, Arterial: 7.141 — CL (ref 7.350–7.450)
pH, Arterial: 7.204 — ABNORMAL LOW (ref 7.350–7.450)

## 2017-04-23 LAB — GLUCOSE, CAPILLARY
GLUCOSE-CAPILLARY: 124 mg/dL — AB (ref 65–99)
GLUCOSE-CAPILLARY: 141 mg/dL — AB (ref 65–99)
GLUCOSE-CAPILLARY: 143 mg/dL — AB (ref 65–99)
GLUCOSE-CAPILLARY: 148 mg/dL — AB (ref 65–99)
GLUCOSE-CAPILLARY: 154 mg/dL — AB (ref 65–99)
GLUCOSE-CAPILLARY: 157 mg/dL — AB (ref 65–99)
GLUCOSE-CAPILLARY: 157 mg/dL — AB (ref 65–99)
GLUCOSE-CAPILLARY: 164 mg/dL — AB (ref 65–99)
GLUCOSE-CAPILLARY: 181 mg/dL — AB (ref 65–99)
GLUCOSE-CAPILLARY: 191 mg/dL — AB (ref 65–99)
GLUCOSE-CAPILLARY: 220 mg/dL — AB (ref 65–99)
GLUCOSE-CAPILLARY: 246 mg/dL — AB (ref 65–99)
Glucose-Capillary: 148 mg/dL — ABNORMAL HIGH (ref 65–99)
Glucose-Capillary: 153 mg/dL — ABNORMAL HIGH (ref 65–99)
Glucose-Capillary: 161 mg/dL — ABNORMAL HIGH (ref 65–99)
Glucose-Capillary: 173 mg/dL — ABNORMAL HIGH (ref 65–99)
Glucose-Capillary: 198 mg/dL — ABNORMAL HIGH (ref 65–99)
Glucose-Capillary: 217 mg/dL — ABNORMAL HIGH (ref 65–99)
Glucose-Capillary: 246 mg/dL — ABNORMAL HIGH (ref 65–99)
Glucose-Capillary: 215 mg/dL — ABNORMAL HIGH (ref 65–99)
Glucose-Capillary: 160 mg/dL — ABNORMAL HIGH (ref 65–99)
Glucose-Capillary: 152 mg/dL — ABNORMAL HIGH (ref 65–99)
Glucose-Capillary: 157 mg/dL — ABNORMAL HIGH (ref 65–99)

## 2017-04-23 LAB — CBC
HCT: 40.9 % (ref 39.0–52.0)
Hemoglobin: 13.4 g/dL (ref 13.0–17.0)
MCH: 28.1 pg (ref 26.0–34.0)
MCHC: 32.8 g/dL (ref 30.0–36.0)
MCV: 85.7 fL (ref 78.0–100.0)
PLATELETS: 210 10*3/uL (ref 150–400)
RBC: 4.77 MIL/uL (ref 4.22–5.81)
RDW: 14.5 % (ref 11.5–15.5)
WBC: 18.6 10*3/uL — AB (ref 4.0–10.5)

## 2017-04-23 LAB — CORTISOL: Cortisol, Plasma: 14.1 ug/dL

## 2017-04-23 LAB — BASIC METABOLIC PANEL
ANION GAP: 7 (ref 5–15)
Anion gap: 4 — ABNORMAL LOW (ref 5–15)
Anion gap: 5 (ref 5–15)
BUN: 18 mg/dL (ref 6–20)
BUN: 24 mg/dL — ABNORMAL HIGH (ref 6–20)
BUN: 26 mg/dL — ABNORMAL HIGH (ref 6–20)
CALCIUM: 5.6 mg/dL — AB (ref 8.9–10.3)
CALCIUM: 8 mg/dL — AB (ref 8.9–10.3)
CO2: 19 mmol/L — ABNORMAL LOW (ref 22–32)
CO2: 25 mmol/L (ref 22–32)
CO2: 24 mmol/L (ref 22–32)
CREATININE: 2.91 mg/dL — AB (ref 0.61–1.24)
CREATININE: 3.87 mg/dL — AB (ref 0.61–1.24)
Calcium: 7.5 mg/dL — ABNORMAL LOW (ref 8.9–10.3)
Chloride: 112 mmol/L — ABNORMAL HIGH (ref 101–111)
Chloride: 116 mmol/L — ABNORMAL HIGH (ref 101–111)
Chloride: 110 mmol/L (ref 101–111)
Creatinine, Ser: 4.66 mg/dL — ABNORMAL HIGH (ref 0.61–1.24)
GFR calc non Af Amer: 24 mL/min — ABNORMAL LOW (ref >60)
GFR, EST AFRICAN AMERICAN: 16 mL/min — AB (ref >60)
GFR, EST AFRICAN AMERICAN: 20 mL/min — AB (ref >60)
GFR, EST AFRICAN AMERICAN: 28 mL/min — AB (ref >60)
GFR, EST NON AFRICAN AMERICAN: 17 mL/min — AB (ref >60)
GFR, EST NON AFRICAN AMERICAN: 14 mL/min — AB (ref >60)
Glucose, Bld: 764 mg/dL (ref 65–99)
Glucose, Bld: 145 mg/dL — ABNORMAL HIGH (ref 65–99)
Glucose, Bld: 157 mg/dL — ABNORMAL HIGH (ref 65–99)
POTASSIUM: 5.5 mmol/L — AB (ref 3.5–5.1)
Potassium: 2.6 mmol/L — CL (ref 3.5–5.1)
Potassium: 4.3 mmol/L (ref 3.5–5.1)
SODIUM: 135 mmol/L (ref 135–145)
SODIUM: 141 mmol/L (ref 135–145)
SODIUM: 146 mmol/L — AB (ref 135–145)

## 2017-04-23 LAB — COOXEMETRY PANEL
CARBOXYHEMOGLOBIN: 0.7 % (ref 0.5–1.5)
METHEMOGLOBIN: 1.1 % (ref 0.0–1.5)
O2 SAT: 89.5 %
Total hemoglobin: 11.7 g/dL — ABNORMAL LOW (ref 12.0–16.0)

## 2017-04-23 LAB — LIPID PANEL
CHOL/HDL RATIO: 3.5 ratio
CHOLESTEROL: 125 mg/dL (ref 0–200)
HDL: 36 mg/dL — AB (ref >40)
LDL Cholesterol: 58 mg/dL (ref 0–99)
TRIGLYCERIDES: 154 mg/dL — AB (ref <150)
VLDL: 31 mg/dL (ref 0–40)

## 2017-04-23 LAB — BLOOD GAS, ARTERIAL
Acid-base deficit: 4 mmol/L — ABNORMAL HIGH (ref 0.0–2.0)
Bicarbonate: 20 mmol/L (ref 20.0–28.0)
DRAWN BY: 21310
FIO2: 100
MECHVT: 550 mL
O2 SAT: 95.3 %
PATIENT TEMPERATURE: 38
PCO2 ART: 58 mmHg — AB (ref 32.0–48.0)
PEEP: 10 cmH2O
PO2 ART: 96 mmHg (ref 83.0–108.0)
RATE: 14 resp/min
pH, Arterial: 7.218 — ABNORMAL LOW (ref 7.350–7.450)

## 2017-04-23 LAB — COMPREHENSIVE METABOLIC PANEL
ALK PHOS: 59 U/L (ref 38–126)
ALT: 264 U/L — AB (ref 17–63)
AST: 531 U/L — AB (ref 15–41)
Albumin: 2.3 g/dL — ABNORMAL LOW (ref 3.5–5.0)
Anion gap: 9 (ref 5–15)
BUN: 39 mg/dL — AB (ref 6–20)
CALCIUM: 6.9 mg/dL — AB (ref 8.9–10.3)
CO2: 20 mmol/L — ABNORMAL LOW (ref 22–32)
CREATININE: 6.96 mg/dL — AB (ref 0.61–1.24)
Chloride: 110 mmol/L (ref 101–111)
GFR, EST AFRICAN AMERICAN: 10 mL/min — AB (ref >60)
GFR, EST NON AFRICAN AMERICAN: 8 mL/min — AB (ref >60)
Glucose, Bld: 142 mg/dL — ABNORMAL HIGH (ref 65–99)
Potassium: 5 mmol/L (ref 3.5–5.1)
Sodium: 139 mmol/L (ref 135–145)
Total Bilirubin: 1 mg/dL (ref 0.3–1.2)
Total Protein: 5.8 g/dL — ABNORMAL LOW (ref 6.5–8.1)

## 2017-04-23 LAB — RENAL FUNCTION PANEL
ALBUMIN: 2.1 g/dL — AB (ref 3.5–5.0)
Anion gap: 9 (ref 5–15)
BUN: 29 mg/dL — AB (ref 6–20)
CALCIUM: 6.1 mg/dL — AB (ref 8.9–10.3)
CO2: 18 mmol/L — ABNORMAL LOW (ref 22–32)
CREATININE: 5.3 mg/dL — AB (ref 0.61–1.24)
Chloride: 107 mmol/L (ref 101–111)
GFR calc Af Amer: 13 mL/min — ABNORMAL LOW (ref >60)
GFR, EST NON AFRICAN AMERICAN: 12 mL/min — AB (ref >60)
GLUCOSE: 697 mg/dL — AB (ref 65–99)
PHOSPHORUS: 4.5 mg/dL (ref 2.5–4.6)
POTASSIUM: 5.4 mmol/L — AB (ref 3.5–5.1)
Sodium: 134 mmol/L — ABNORMAL LOW (ref 135–145)

## 2017-04-23 LAB — TSH: TSH: 1.292 u[IU]/mL (ref 0.350–4.500)

## 2017-04-23 LAB — HIV ANTIBODY (ROUTINE TESTING W REFLEX): HIV SCREEN 4TH GENERATION: NONREACTIVE

## 2017-04-23 LAB — SODIUM, URINE, RANDOM: Sodium, Ur: 50 mmol/L

## 2017-04-23 LAB — EXPECTORATED SPUTUM ASSESSMENT W REFEX TO RESP CULTURE

## 2017-04-23 LAB — LACTIC ACID, PLASMA
LACTIC ACID, VENOUS: 1.6 mmol/L (ref 0.5–1.9)
Lactic Acid, Venous: 1.7 mmol/L (ref 0.5–1.9)

## 2017-04-23 LAB — EXPECTORATED SPUTUM ASSESSMENT W GRAM STAIN, RFLX TO RESP C

## 2017-04-23 LAB — MAGNESIUM: MAGNESIUM: 1.3 mg/dL — AB (ref 1.7–2.4)

## 2017-04-23 LAB — CREATININE, URINE, RANDOM: CREATININE, URINE: 260.09 mg/dL

## 2017-04-23 LAB — HEMOGLOBIN A1C
HEMOGLOBIN A1C: 9.4 % — AB (ref 4.8–5.6)
Mean Plasma Glucose: 223.08 mg/dL

## 2017-04-23 LAB — PROCALCITONIN: Procalcitonin: >150 ng/mL

## 2017-04-23 MED ORDER — PHENYLEPHRINE HCL-NACL 10-0.9 MG/250ML-% IV SOLN
INTRAVENOUS | Status: AC
  Filled 2017-04-23: qty 250

## 2017-04-23 MED ORDER — CHLORHEXIDINE GLUCONATE 0.12% ORAL RINSE (MEDLINE KIT)
15.0000 mL | Freq: Two times a day (BID) | OROMUCOSAL | Status: DC
  Administered 2017-04-23 – 2017-04-28 (×12): 15 mL via OROMUCOSAL

## 2017-04-23 MED ORDER — ENOXAPARIN SODIUM 30 MG/0.3ML ~~LOC~~ SOLN: 30.0000 mg | SUBCUTANEOUS | Status: DC

## 2017-04-23 MED ORDER — INSULIN ASPART 100 UNIT/ML ~~LOC~~ SOLN
2.0000 [IU] | SUBCUTANEOUS | Status: DC
  Administered 2017-04-24: 6 [IU] via SUBCUTANEOUS
  Administered 2017-04-24: 4 [IU] via SUBCUTANEOUS

## 2017-04-23 MED ORDER — SODIUM CHLORIDE 0.9 % IV SOLN
3.0000 g | Freq: Two times a day (BID) | INTRAVENOUS | Status: DC
  Filled 2017-04-23 (×3): qty 3

## 2017-04-23 MED ORDER — ORAL CARE MOUTH RINSE
15.0000 mL | OROMUCOSAL | Status: DC
  Administered 2017-04-23 – 2017-04-28 (×49): 15 mL via OROMUCOSAL

## 2017-04-23 MED ORDER — ORAL CARE MOUTH RINSE
15.0000 mL | Freq: Four times a day (QID) | OROMUCOSAL | Status: DC
  Administered 2017-04-23: 15 mL via OROMUCOSAL

## 2017-04-23 MED ORDER — INSULIN GLARGINE 100 UNIT/ML ~~LOC~~ SOLN
15.0000 [IU] | SUBCUTANEOUS | Status: DC
  Administered 2017-04-23: 15 [IU] via SUBCUTANEOUS
  Filled 2017-04-23: qty 0.15

## 2017-04-23 MED ORDER — PIPERACILLIN-TAZOBACTAM IN DEX 2-0.25 GM/50ML IV SOLN
2.2500 g | Freq: Three times a day (TID) | INTRAVENOUS | Status: DC
  Administered 2017-04-23 – 2017-04-24 (×3): 2.25 g via INTRAVENOUS
  Filled 2017-04-23 (×7): qty 50

## 2017-04-23 MED ORDER — IPRATROPIUM-ALBUTEROL 0.5-2.5 (3) MG/3ML IN SOLN: 3.0000 mL | RESPIRATORY_TRACT | Status: DC | PRN

## 2017-04-23 MED ORDER — SODIUM CHLORIDE 0.9 % IV BOLUS (SEPSIS)
500.0000 mL | Freq: Once | INTRAVENOUS | Status: AC
  Administered 2017-04-23: 500 mL via INTRAVENOUS

## 2017-04-23 MED ORDER — ATORVASTATIN CALCIUM 40 MG PO TABS
40.0000 mg | ORAL_TABLET | Freq: Every day | ORAL | Status: DC
  Administered 2017-04-23 – 2017-04-24 (×2): 40 mg
  Filled 2017-04-23 (×2): qty 1

## 2017-04-23 MED ORDER — ACETAMINOPHEN 325 MG PO TABS: 325.0000 mg | ORAL_TABLET | Freq: Once | ORAL | Status: AC

## 2017-04-23 MED ORDER — HEPARIN SODIUM (PORCINE) 5000 UNIT/ML IJ SOLN
5000.0000 [IU] | Freq: Two times a day (BID) | INTRAMUSCULAR | Status: DC
  Administered 2017-04-23 – 2017-04-26 (×6): 5000 [IU] via SUBCUTANEOUS
  Filled 2017-04-23 (×6): qty 1

## 2017-04-23 MED ORDER — ASPIRIN 81 MG PO CHEW
324.0000 mg | CHEWABLE_TABLET | Freq: Once | ORAL | Status: AC
  Administered 2017-04-23: 324 mg
  Filled 2017-04-23: qty 4

## 2017-04-23 MED ORDER — PANTOPRAZOLE SODIUM 40 MG IV SOLR
40.0000 mg | INTRAVENOUS | Status: DC
  Administered 2017-04-23 – 2017-04-28 (×6): 40 mg via INTRAVENOUS
  Filled 2017-04-23 (×6): qty 40

## 2017-04-23 MED ORDER — SODIUM CHLORIDE 0.9 % IV SOLN
3.0000 g | Freq: Once | INTRAVENOUS | Status: AC
  Administered 2017-04-23: 3 g via INTRAVENOUS
  Filled 2017-04-23 (×2): qty 3

## 2017-04-23 MED ORDER — SODIUM CHLORIDE 0.9 % IV SOLN
500.0000 mg | Freq: Two times a day (BID) | INTRAVENOUS | Status: DC
  Administered 2017-04-23 – 2017-04-28 (×10): 500 mg via INTRAVENOUS
  Filled 2017-04-23 (×11): qty 5

## 2017-04-23 MED FILL — Medication: Qty: 1 | Status: AC

## 2017-04-23 NOTE — Progress Notes (Signed)
ANTIBIOTIC CONSULT NOTE-Preliminary  Pharmacy Consult for Unasyn Indication: Pneumonia  No Known Allergies  Patient Measurements: Height: 5\' 10"  (177.8 cm) Weight: 197 lb 8.5 oz (89.6 kg) IBW/kg (Calculated) : 73  Vital Signs: Temp: 101.3 F (38.5 C) (11/20 0010) Temp Source: Axillary (11/20 0010) BP: 78/61 (11/20 0030) Pulse Rate: 122 (11/20 0030)  Labs: Recent Labs    04/26/2017 0908 04/13/2017 0911  WBC 18.9*  --   HGB 15.5 16.3  PLT 296  --   CREATININE 1.54* 1.30*    Estimated Creatinine Clearance: 78.2 mL/min (A) (by C-G formula based on SCr of 1.3 mg/dL (H)).  No results for input(s): VANCOTROUGH, VANCOPEAK, VANCORANDOM, GENTTROUGH, GENTPEAK, GENTRANDOM, TOBRATROUGH, TOBRAPEAK, TOBRARND, AMIKACINPEAK, AMIKACINTROU, AMIKACIN in the last 72 hours.   Microbiology: Recent Results (from the past 720 hour(s))  MRSA PCR Screening     Status: None   Collection Time: 04/21/2017  3:32 PM  Result Value Ref Range Status   MRSA by PCR NEGATIVE NEGATIVE Final    Comment:        The GeneXpert MRSA Assay (FDA approved for NASAL specimens only), is one component of a comprehensive MRSA colonization surveillance program. It is not intended to diagnose MRSA infection nor to guide or monitor treatment for MRSA infections.   Culture, blood (Routine X 2) w Reflex to ID Panel     Status: None (Preliminary result)   Collection Time: 04/05/2017  6:58 PM  Result Value Ref Range Status   Specimen Description BLOOD RIGHT ARM  Final   Special Requests BOTTLES DRAWN AEROBIC AND ANAEROBIC  Final   Culture PENDING  Incomplete   Report Status PENDING  Incomplete  Culture, blood (Routine X 2) w Reflex to ID Panel     Status: None (Preliminary result)   Collection Time: 04/13/2017  6:59 PM  Result Value Ref Range Status   Specimen Description BLOOD LEFT ARM  Final   Special Requests   Final    BOTTLES DRAWN AEROBIC AND ANAEROBIC Blood Culture adequate volume   Culture PENDING  Incomplete   Report Status PENDING  Incomplete    Medical History: Past Medical History:  Diagnosis Date  . Asthma   . Diabetes mellitus without complication ([facility])   . HTN (hypertension)   . Seizures ([facility])     Medications:   Assessment: 46 yo male s/p seizures; now intubated in the ICU. Concern for evolving aspiration pneumonia. Pharmacy has been consulted for Unasyn dosing.  Goal of Therapy:  Eradicate infection  Plan:  Preliminary review of pertinent patient information completed.  Protocol will be initiated with dose of Unasyn 3 Gm IV.  [name] clinical pharmacist will complete review during morning rounds to assess patient and finalize treatment regimen if needed.  Arelia SneddonMason, Anvita Hirata Anne, [facility] HospitalRPH 04/23/2017,1:06 AM

## 2017-04-23 NOTE — Progress Notes (Signed)
Decreased urinary output, <30 ml output over last 4 hours,  Axillary temp 101.3 after Tylenol 650 given @ 1953. floor coverage for attending notified. New orders given.

## 2017-04-23 NOTE — Progress Notes (Signed)
Inpatient Diabetes Program Recommendations  AACE/ADA: New Consensus Statement on Inpatient Glycemic Control (2015)  Target Ranges:  Prepandial:   less than 140 mg/dL      Peak postprandial:   less than 180 mg/dL (1-2 hours)      Critically ill patients:  140 - 180 mg/dL   Results for Ricardo Rogers, Abdalrahman W (MRN [id number]) as of 04/23/2017 07:22  Ref. Range 04/13/2017 23:52 04/23/2017 00:42 04/23/2017 01:40 04/23/2017 02:46 04/23/2017 03:38 04/23/2017 04:49 04/23/2017 05:46 04/23/2017 06:33  Glucose-Capillary Latest Ref Range: 65 - 99 mg/dL 409225 (H) 811164 (H) 914141 (H) 143 (H) 157 (H) 148 (H) 153 (H) 220 (H)  Results for Ricardo Rogers, Lillard W (MRN [id number]) as of 04/23/2017 07:22  Ref. Range 05/03/2017 09:08 04/25/2017 09:11 04/23/2017 00:25  Glucose Latest Ref Range: 65 - 99 mg/dL 086560 (HH) 578590 (HH) [id number] [facility](HH)  Results for Ricardo Rogers, Garcia W (MRN [id number]) as of 04/23/2017 07:22  Ref. Range 08/21/2012 04:58  Hemoglobin A1C Latest Ref Range: <5.7 % 12.2 (H)   Review of Glycemic Control  Diabetes history: DM2 Outpatient Diabetes medications: Glipizide 10 mg BID, Metformin 1000 mg BID Current orders for Inpatient glycemic control: IV insulin drip; Solumedrol 40 mg Q6H  Inpatient Diabetes Program Recommendations:  HgbA1C: Please consider ordering an A1C to evaluate glycemic control over the past 2-3 months.  NOTE: In reviewing chart, noted patient was not initially acidotic when he presented to the hospital but initial glucose was 560 mg/dl. CBG noted to be up to 764 mg/dl on labs at 24:4000:25. [facility] intubated [facility]. Patient is ordered Solumedrol which is contributing to hyperglycemia.   Thanks, Orlando PennerMarie Lee Kalt, RN, MSN, CDE Diabetes Coordinator Inpatient Diabetes Program [phone] (Team Pager from 8am to 5pm)

## 2017-04-23 NOTE — H&P (Signed)
PULMONARY / CRITICAL CARE MEDICINE   Name: Ricardo Rogers MRN: [id number] DOB: 12-Jan-1971    ADMISSION DATE:  2016-07-30 CONSULTATION DATE:  04/23/2017  REFERRING MD:  [name]  CHIEF COMPLAINT:  Seizure  HISTORY OF PRESENT ILLNESS:  HPI obtained from medical chart review and from patient's mother at the bedside.     46 year old male with past medical history of seizures (not on AED), hypertension, diabetes, and childhood asthma who [facility] by EMS with reported seizure like activity.  Mother witnessed seizure at home following fall from standing position in kitchen, unsure any head trauma.   Patient initially combative/ post-ictal in ER and then had another witnessed seizure followed by bradycardia into the 30's.  CPR briefly done, possibly 3 mins, and then intubated for airway protection.  During intubation, patient vomited large amounts of liquid like grape juice (patient drinks grape soda frequently at home) despite RSI with paralytics with probable aspiration.  Patient did require sedation afterwards for movements.  Initial glucose 498 up to 764 in ER.  Reportedly, last seizure was 1 year ago in the setting of hyperglycemia; no prior seizure history.  Patient is not compliant with any of his medications or diet.  Mother denies any recent illness or complaints from patient.  [facility] at [facility]  Placed continuous sedation, glucose stabilizer and neurology consulted.  Patient with hypotension requiring fluid boluses and placement of PICC line for poor venous access.   Patient loaded with keppra and seizures thought attributed to hyperglycemia.  CXR with progressive worsening of bilateral airspace disease after aspirating requiring increased PEEP and FiO2 on mechanical ventilation.  Started on empiric Unasyn.  Overnight, he developed fever, oliguira, and required vasopressor support.  [facility], PCCM to accept and manage.   PAST MEDICAL HISTORY :  He  has a past medical history of Asthma, Diabetes mellitus without complication (HCC), HTN (hypertension), and Seizures (HCC).  PAST SURGICAL HISTORY: He  has no past surgical history on file.  No Known Allergies  No current facility-administered medications on file prior to encounter.    Current Outpatient Medications on File Prior to Encounter  Medication Sig  . ADVAIR DISKUS 250-50 MCG/DOSE AEPB Inhale 1 puff into the lungs 2 (two) times daily as needed.   Marland Kitchen. atorvastatin (LIPITOR) 20 MG tablet Take 20 mg by mouth daily at 6 PM.  . glipiZIDE (GLUCOTROL) 10 MG tablet Take 1 tablet (10 mg total) by mouth 2 (two) times daily before a meal. (Patient taking differently: Take 10 mg by mouth daily before breakfast. )  . lisinopril-hydrochlorothiazide (PRINZIDE,ZESTORETIC) 20-12.5 MG per tablet Take 1 tablet by mouth 2 (two) times daily.  . metFORMIN (GLUCOPHAGE) 1000 MG tablet Take 1 tablet (1,000 mg total) by mouth 2 (two) times daily with a meal.  . cephALEXin (KEFLEX) 500 MG capsule Take 1 capsule (500 mg total) by mouth 4 (four) times daily. (Patient not taking: Reported on 04/23/2017)  . HYDROcodone-acetaminophen (NORCO) 5-325 MG tablet Take 1-2 tablets by mouth every 6 (six) hours as needed. (Patient not taking: Reported on 04/23/2017)    FAMILY HISTORY:  His indicated that the status of his other is unknown.   SOCIAL HISTORY: He  reports that  has never smoked. he has never used smokeless tobacco. He reports that he does not drink alcohol or use drugs.  REVIEW OF  SYSTEMS:   Unable to assess  SUBJECTIVE:  Arrived on Fentanyl 200 mcg/hr, Versed 1 mg, insulin gtt, and neosynephrine at 50 mcg/hr Sedation reduced to half at 1330 and then off.    VITAL SIGNS: BP 97/70   Pulse (!) 117   Temp 99.5 F (37.5 C)   Resp 13   Ht 5\' 10"  (1.778 m)   Wt 197 lb 8.5 oz (89.6 kg)   SpO2 92%   BMI 28.34  kg/m   HEMODYNAMICS: CVP:  [8 mmHg-21 mmHg] 14 mmHg  VENTILATOR SETTINGS: Vent Mode: PRVC FiO2 (%):  [60 %-100 %] 80 % Set Rate:  [14 bmp] 14 bmp Vt Set:  [550 mL] 550 mL PEEP:  [5 cmH20-10 cmH20] 10 cmH20 Plateau Pressure:  [24 cmH20-28 cmH20] 27 cmH20  INTAKE / OUTPUT: I/O last 3 completed shifts: In: 4825.5 [I.V.:4125.5; IV Piggyback:700] Out: 1175 [Urine:975; Emesis/NG output:200]  PHYSICAL EXAMINATION: General:  Critically ill AAM lying in bed on MV in NAD HEENT: Tongue with bruising (old bite)/ no bleeding, ETT, OGT right nare, pupils 3/no response, +sluggish corneals Neuro:  Unresponsive, no gag or cough CV: ST 115, rrr, distant, no m/r/g PULM: even/non-labored on MV, not breathing over vent, rhonchi bilaterally, no wheeze GI: soft, round, bs active, foley Extremities: warm/dry, no edema  Skin: no rashes  LABS:  BMET Recent Labs  Lab 04/23/17 0025 04/23/17 0201 04/23/17 0623  NA 135 146* 141  K 2.6* 4.3 5.5*  CL 112* 116* 110  CO2 19* 25 24  BUN 18 24* 26*  CREATININE 2.91* 3.87* 4.66*  GLUCOSE 764* 145* 157*    Electrolytes Recent Labs  Lab 04/23/17 0025 04/23/17 0201 04/23/17 0623  CALCIUM 5.6* 8.0* 7.5*    CBC Recent Labs  Lab 05/03/2017 0908 May 03, 2017 0911 04/23/17 0201  WBC 18.9*  --  18.6*  HGB 15.5 16.3 13.4  HCT 44.7 48.0 40.9  PLT 296  --  210    Coag's No results for input(s): APTT, INR in the last 168 hours.  Sepsis Markers Recent Labs  Lab 2017-05-03 1242 05/03/2017 1859  LATICACIDVEN 2.0* 5.0*    ABG Recent Labs  Lab May 03, 2017 1024 2017-05-03 1645 04/23/17 0120  PHART 7.234* 7.309* 7.218*  PCO2ART 60.5* 40.7 58.0*  PO2ART 309* 60.0* 96.0    Liver Enzymes Recent Labs  Lab May 03, 2017 0908  AST 25  ALT 26  ALKPHOS 114  BILITOT 0.8  ALBUMIN 4.0    Cardiac Enzymes Recent Labs  Lab 05/03/17 0911  TROPONINI <0.03    Glucose Recent Labs  Lab 04/23/17 0739 04/23/17 0848 04/23/17 0951 04/23/17 1048  04/23/17 1135 04/23/17 1219  GLUCAP 161* 148* 154* 191* 124* 157*    Imaging Dg Chest Port 1v Same Day  Result Date: 04/23/2017 CLINICAL DATA:  Initial evaluation for acute respiratory failure. EXAM: PORTABLE CHEST 1 VIEW COMPARISON:  Prior radiograph from earlier the same day. FINDINGS: Endotracheal tube in place with tip positioned 4.2 cm above the carina. Enteric tube courses in the the abdomen. Right-sided PICC catheter in place with tip overlying the distal SVC. Cardiac and mediastinal silhouettes are partially obscured, but grossly stable. Interval development of extensive multifocal bilateral airspace opacities, left greater than right, which may reflect progressive infiltrates and/ or asymmetric edema. Suspected left pleural effusion. No pneumothorax. No acute osseous abnormality. IMPRESSION: 1. Interval development of extensive bilateral airspace opacities, left worse than right. Given the fairly rapid development of these findings, asymmetric pulmonary edema is favored. Rapidly progressive infiltrates and/or aspiration  could also be considered. 2. Suspected left pleural effusion. Electronically Signed   By: Rise MuBenjamin  McClintock M.D.   On: 04/23/2017 00:09   STUDIES:  CT head 11/19 >> no acute intracranial abnormality; stable cerebral atrophy; acute pansinusitis  CT head 11/20 >>  CULTURES: MRSA 11/19>> neg Sputum Cx 11/19 >> BC x 2 11/19 >>  ANTIBIOTICS: 11/20 Unasyn >>11/20 11/20 Vanc>> 11/20 Zosyn >>  SIGNIFICANT EVENTS: 11/19  Admit to [facility] 11/20  Transfer to [facility]  LINES/TUBES: 11/19 ETT 11/19  11/19 R PICC >>  DISCUSSION: [age] yoM w/PMH uncontrolled HTN and diabetes and hx of seizure (seizures associated w/ hyperglycemia) with witnessed seizures that became bradycardic in ER while postictal, requiring brief CPR, vomiting during intubation, with progressive renal failure, hypotension, worsening infiltrates.    ASSESSMENT / PLAN:  PULMONARY A: Acute hypoxic  respiratory failure following seizure/ emesis Aspiration pneumonitis r/o pneumonia ARDS Hx Asthma - CXR 11/19 with development of bilateral opacities L > R P:   Full MV per ARDS protocol w/ repeat ABG in 1 hour CXR and ABG now VAP protocol See ID BD prn  Decrease solumedrol to 40mg  BID  CARDIOVASCULAR A:  Shock- presumed septic vs related to sedation Brief CPR- ?3mins following bradycardia/ seizure P:  Tele monitoring Levophed for goal MAP > 65 Trend Lactate Assess TTE Trend CVP Consider aline if 2nd pressor added Assess co-ox, cortisol   RENAL A:   AKI on CKD (previous sCr 1.46 in 5/16)- oliguria w/ CVP of 16 Hyperkalemia P:   Assess urine creatinine and Na May need Nephrology consult if no improvement in renal function Hold IVF  BMET/ Mg now Trend BMP / mg/ phos/ urinary output/ daily weights  Replace electrolytes as indicated Consider renal ultrasound  GASTROINTESTINAL A:   NPO P:   NPO Continue OGT PPI for SUP Trend LFTs  HEMATOLOGIC A:   Leukocytosis  - HIV neg P:  Trend CBC SCDs/ Lovenox for VTE ppx   INFECTIOUS A:   R/o Aspiration pneumonia vs pneumonitis  P:   Continue unasyn  Follow blood and sputum cx Trend WBC/ fever Tylenol prn fever Trend PCT to guide abx therapy  ENDOCRINE A:   Uncontrolled DM, ?possible HHNK P:   CBG q 4  Transition off insulin gtt w/ SSI and lantus 15 units daily Assess TSH and HgbA1c  NEUROLOGIC A:   Acute encephalopathy related to seizure/ postictal state +/- infection (pulmonary) +/- sedation w/ AKI vs ?other CNS etiology  Seizure- hx of, on no home meds - UDS neg - CT head 11/19 - negative P:   RASS goal: 0 Hold sedation  Stat head CT as patient is unresponsive, no cough, gag, pupils unresponsive Consult neurology Stat EEG to r/o subclinical status Continue keppra per Neuro recs  FAMILY  - Updates: Mother and sister updated at bedside.    - Inter-disciplinary family meet or Palliative Care  meeting due by:  04/30/2017  Posey BoyerBrooke Puanani Gene, AGACNP- [facility] Pulmonary and Critical Care Medicine [facility] Pager: [phone]  04/23/2017, 1:41 PM

## 2017-04-23 NOTE — Progress Notes (Signed)
Critical ABG values given to B. Lodema HongSimpson, NP at [phone].  Per verbal order, remove pt from ARDS, return the VT to 8 cc's and change RR to match current MVe.  Current Mve before  And after the vent changes was 11.7.

## 2017-04-23 NOTE — Progress Notes (Signed)
Pharmacy Antibiotic Note  Ricardo HighlandJohn W Rogers is a 46 y.o. male admitted on 06-22-16 with pneumonia.  Pharmacy has been consulted for Unasyn dosing.  Plan: Unasyn 3gm IV every 12 hours. Monitor labs, micro and vitals.   Height: 5\' 10"  (177.8 cm) Weight: 197 lb 8.5 oz (89.6 kg) IBW/kg (Calculated) : 73  Temp (24hrs), Avg:100.3 F (37.9 C), Min:98.9 F (37.2 C), Max:101.3 F (38.5 C)  Recent Labs  Lab 2016/08/13 0908 2016/08/13 0911 2016/08/13 1242 2016/08/13 1859 04/23/17 0025 04/23/17 0201 04/23/17 0623  WBC 18.9*  --   --   --   --  18.6*  --   CREATININE 1.54* 1.30*  --   --  2.91* 3.87* 4.66*  LATICACIDVEN  --   --  2.0* 5.0*  --   --   --     Estimated Creatinine Clearance: 21.8 mL/min (A) (by C-G formula based on SCr of 4.66 mg/dL (H)).    No Known Allergies  Antimicrobials this admission: Unasyn 11/20 >>    >>   Dose adjustments this admission: n/a   Microbiology results: 11/19 BCx: pending 11/19 Sputum: pending  11/19 MRSA PCR: (-)  Thank you for allowing pharmacy to be a part of this patient's care.  Ricardo GemmaHayes, Ricardo Rogers 04/23/2017 11:15 AM

## 2017-04-23 NOTE — Progress Notes (Signed)
Subjective: Events of last night noted.  He was more hypotensive and is now on Neo-Synephrine.  Chest x-ray consistent with aspiration pneumonitis and he is on antibiotics now.  He is on 90% oxygen and 10 of PEEP and his oxygenation is better.  Objective: Vital signs in last 24 hours: Temp:  [98.9 F (37.2 C)-101.3 F (38.5 C)] 99.9 F (37.7 C) (11/20 0737) Pulse Rate:  [106-143] 119 (11/20 0737) Resp:  [7-50] 15 (11/20 0737) BP: (67-267)/(42-136) 101/79 (11/20 0700) SpO2:  [83 %-100 %] 93 % (11/20 0737) FiO2 (%):  [60 %-100 %] 90 % (11/20 0700) Weight:  [89.6 kg (197 lb 8.5 oz)-90.7 kg (200 lb)] 89.6 kg (197 lb 8.5 oz) (11/19 1501) Weight change:     Intake/Output from previous day: 11/19 0701 - 11/20 0700 In: 4825.5 [I.V.:4125.5; IV Piggyback:700] Out: 1175 [Urine:975; Emesis/NG output:200]  PHYSICAL EXAM General appearance: Intubated sedated on mechanical ventilation Resp: rales bilaterally Cardio: regular rate and rhythm, S1, S2 normal, no murmur, click, rub or gallop GI: soft, non-tender; bowel sounds normal; no masses,  no organomegaly Extremities: extremities normal, atraumatic, no cyanosis or edema Skin turgor fair.  Lab Results:  Results for orders placed or performed during the hospital encounter of 04/23/2017 (from the past 48 hour(s))  CBC WITH DIFFERENTIAL     Status: Abnormal   Collection Time: 04/18/2017  9:08 AM  Result Value Ref Range   WBC 18.9 (H) 4.0 - 10.5 K/uL   RBC 5.54 4.22 - 5.81 MIL/uL   Hemoglobin 15.5 13.0 - 17.0 g/dL   HCT 44.7 39.0 - 52.0 %   MCV 80.7 78.0 - 100.0 fL   MCH 28.0 26.0 - 34.0 pg   MCHC 34.7 30.0 - 36.0 g/dL   RDW 13.7 11.5 - 15.5 %   Platelets 296 150 - 400 K/uL   Neutrophils Relative % 74 %   Neutro Abs 13.9 (H) 1.7 - 7.7 K/uL   Lymphocytes Relative 21 %   Lymphs Abs 4.0 0.7 - 4.0 K/uL   Monocytes Relative 5 %   Monocytes Absolute 0.9 0.1 - 1.0 K/uL   Eosinophils Relative 0 %   Eosinophils Absolute 0.1 0.0 - 0.7 K/uL   Basophils Relative 0 %   Basophils Absolute 0.0 0.0 - 0.1 K/uL  Comprehensive metabolic panel     Status: Abnormal   Collection Time: 04/09/2017  9:08 AM  Result Value Ref Range   Sodium 136 135 - 145 mmol/L   Potassium 3.1 (L) 3.5 - 5.1 mmol/L   Chloride 98 (L) 101 - 111 mmol/L   CO2 22 22 - 32 mmol/L   Glucose, Bld 560 (HH) 65 - 99 mg/dL    Comment: CRITICAL RESULT CALLED TO, READ BACK BY AND VERIFIED WITH: WHITE,M AT 9:456AM ON 04/16/2017 BY FESTERMAN,C    BUN 15 6 - 20 mg/dL   Creatinine, Ser 1.54 (H) 0.61 - 1.24 mg/dL   Calcium 9.6 8.9 - 10.3 mg/dL   Total Protein 8.4 (H) 6.5 - 8.1 g/dL   Albumin 4.0 3.5 - 5.0 g/dL   AST 25 15 - 41 U/L   ALT 26 17 - 63 U/L   Alkaline Phosphatase 114 38 - 126 U/L   Total Bilirubin 0.8 0.3 - 1.2 mg/dL   GFR calc non Af Amer 52 (L) >60 mL/min   GFR calc Af Amer 60 (L) >60 mL/min    Comment: (NOTE) The eGFR has been calculated using the CKD EPI equation. This calculation has not been validated  in all clinical situations. eGFR's persistently <60 mL/min signify possible Chronic Kidney Disease.    Anion gap 16 (H) 5 - 15  Ethanol     Status: None   Collection Time: 04/09/2017  9:08 AM  Result Value Ref Range   Alcohol, Ethyl (B) <10 <10 mg/dL    Comment:        LOWEST DETECTABLE LIMIT FOR SERUM ALCOHOL IS 10 mg/dL FOR MEDICAL PURPOSES ONLY   I-stat chem 8, ed     Status: Abnormal   Collection Time: 04/06/2017  9:11 AM  Result Value Ref Range   Sodium 138 135 - 145 mmol/L   Potassium 3.8 3.5 - 5.1 mmol/L   Chloride 101 101 - 111 mmol/L   BUN 18 6 - 20 mg/dL   Creatinine, Ser 1.30 (H) 0.61 - 1.24 mg/dL   Glucose, Bld 590 (HH) 65 - 99 mg/dL   Calcium, Ion 1.20 1.15 - 1.40 mmol/L   TCO2 27 22 - 32 mmol/L   Hemoglobin 16.3 13.0 - 17.0 g/dL   HCT 48.0 39.0 - 52.0 %  Troponin I     Status: None   Collection Time: 04/15/2017  9:11 AM  Result Value Ref Range   Troponin I <0.03 <0.03 ng/mL  CBG monitoring, ED     Status: Abnormal   Collection  Time: 04/04/2017  9:32 AM  Result Value Ref Range   Glucose-Capillary >600 (HH) 65 - 99 mg/dL  Urinalysis, Routine w reflex microscopic     Status: Abnormal   Collection Time: 04/20/2017 10:24 AM  Result Value Ref Range   Color, Urine STRAW (A) YELLOW   APPearance CLEAR CLEAR   Specific Gravity, Urine 1.020 1.005 - 1.030   pH 5.0 5.0 - 8.0   Glucose, UA >=500 (A) NEGATIVE mg/dL   Hgb urine dipstick MODERATE (A) NEGATIVE   Bilirubin Urine NEGATIVE NEGATIVE   Ketones, ur NEGATIVE NEGATIVE mg/dL   Protein, ur 100 (A) NEGATIVE mg/dL   Nitrite NEGATIVE NEGATIVE   Leukocytes, UA NEGATIVE NEGATIVE   RBC / HPF 0-5 0 - 5 RBC/hpf   WBC, UA 0-5 0 - 5 WBC/hpf   Bacteria, UA RARE (A) NONE SEEN   Squamous Epithelial / LPF 0-5 (A) NONE SEEN  Rapid urine drug screen (hospital performed)     Status: None   Collection Time: 04/16/2017 10:24 AM  Result Value Ref Range   Opiates NONE DETECTED NONE DETECTED   Cocaine NONE DETECTED NONE DETECTED   Benzodiazepines NONE DETECTED NONE DETECTED   Amphetamines NONE DETECTED NONE DETECTED   Tetrahydrocannabinol NONE DETECTED NONE DETECTED   Barbiturates NONE DETECTED NONE DETECTED    Comment:        DRUG SCREEN FOR MEDICAL PURPOSES ONLY.  IF CONFIRMATION IS NEEDED FOR ANY PURPOSE, NOTIFY LAB WITHIN 5 DAYS.        LOWEST DETECTABLE LIMITS FOR URINE DRUG SCREEN Drug Class       Cutoff (ng/mL) Amphetamine      1000 Barbiturate      200 Benzodiazepine   035 Tricyclics       597 Opiates          300 Cocaine          300 THC              50   Blood gas, arterial ([facility] & [facility] ONLY)     Status: Abnormal   Collection Time: 04/27/2017 10:24 AM  Result Value Ref Range   FIO2 1.00  O2 Content 100.0 L/min   Delivery systems VENTILATOR    Mode PRESSURE REGULATED VOLUME CONTROL    VT 550 mL   LHR 14 resp/min   Peep/cpap 5.0 cm H20   pH, Arterial 7.234 (L) 7.350 - 7.450   pCO2 arterial 60.5 (H) 32.0 - 48.0 mmHg   pO2, Arterial 309 (H) 83.0 - 108.0 mmHg    Bicarbonate 21.4 20.0 - 28.0 mmol/L   Acid-base deficit 1.9 0.0 - 2.0 mmol/L   O2 Saturation 99.2 %   Collection site RIGHT RADIAL    Drawn by [phone]    Sample type ARTERIAL    Allens test (pass/fail) PASS PASS  CBG monitoring, ED     Status: Abnormal   Collection Time: 04/30/2017 11:24 AM  Result Value Ref Range   Glucose-Capillary >600 (HH) 65 - 99 mg/dL  CBG monitoring, ED     Status: Abnormal   Collection Time: 04/18/2017 12:36 PM  Result Value Ref Range   Glucose-Capillary >600 (HH) 65 - 99 mg/dL  Lactic acid, plasma     Status: Abnormal   Collection Time: 04/20/2017 12:42 PM  Result Value Ref Range   Lactic Acid, Venous 2.0 (HH) 0.5 - 1.9 mmol/L    Comment: CRITICAL RESULT CALLED TO, READ BACK BY AND VERIFIED WITH: WHITE,M AT 1340 ON 11.19.2018 BY ISLEY,B   CBG monitoring, ED     Status: Abnormal   Collection Time: 04/17/2017  1:41 PM  Result Value Ref Range   Glucose-Capillary 559 (HH) 65 - 99 mg/dL   Comment 1 Document in Chart   Glucose, capillary     Status: Abnormal   Collection Time: 05/01/2017  3:01 PM  Result Value Ref Range   Glucose-Capillary 458 (H) 65 - 99 mg/dL   Comment 1 Notify RN    Comment 2 Document in Chart   MRSA PCR Screening     Status: None   Collection Time: 04/11/2017  3:32 PM  Result Value Ref Range   MRSA by PCR NEGATIVE NEGATIVE    Comment:        The GeneXpert MRSA Assay (FDA approved for NASAL specimens only), is one component of a comprehensive MRSA colonization surveillance program. It is not intended to diagnose MRSA infection nor to guide or monitor treatment for MRSA infections.   Glucose, capillary     Status: Abnormal   Collection Time: 04/06/2017  3:52 PM  Result Value Ref Range   Glucose-Capillary 440 (H) 65 - 99 mg/dL  Culture, expectorated sputum-assessment     Status: None   Collection Time: 05/03/2017  4:40 PM  Result Value Ref Range   Specimen Description SPU    Special Requests SPU    Sputum evaluation THIS SPECIMEN IS  ACCEPTABLE FOR SPUTUM CULTURE    Report Status 04/23/2017 FINAL   Blood gas, arterial     Status: Abnormal   Collection Time: 04/21/2017  4:45 PM  Result Value Ref Range   FIO2 70.00    Delivery systems VENTILATOR    Mode PRESSURE REGULATED VOLUME CONTROL    VT 550 mL   LHR 14 resp/min   Peep/cpap 5.0 cm H20   pH, Arterial 7.309 (L) 7.350 - 7.450   pCO2 arterial 40.7 32.0 - 48.0 mmHg   pO2, Arterial 60.0 (L) 83.0 - 108.0 mmHg   Bicarbonate 19.7 (L) 20.0 - 28.0 mmol/L   Acid-base deficit 5.3 (H) 0.0 - 2.0 mmol/L   O2 Saturation 88.5 %   Patient temperature 37.0  Collection site RIGHT RADIAL    Drawn by [phone]    Sample type ARTERIAL DRAW    Allens test (pass/fail) PASS PASS  Glucose, capillary     Status: Abnormal   Collection Time: 05/01/2017  5:00 PM  Result Value Ref Range   Glucose-Capillary 386 (H) 65 - 99 mg/dL  Glucose, capillary     Status: Abnormal   Collection Time: 04/21/2017  6:02 PM  Result Value Ref Range   Glucose-Capillary 265 (H) 65 - 99 mg/dL  Glucose, capillary     Status: Abnormal   Collection Time: 04/25/2017  6:47 PM  Result Value Ref Range   Glucose-Capillary 195 (H) 65 - 99 mg/dL  Culture, blood (Routine X 2) w Reflex to ID Panel     Status: None (Preliminary result)   Collection Time: 04/15/2017  6:58 PM  Result Value Ref Range   Specimen Description BLOOD RIGHT ARM    Special Requests BOTTLES DRAWN AEROBIC AND ANAEROBIC    Culture NO GROWTH < 12 HOURS    Report Status PENDING   Culture, blood (Routine X 2) w Reflex to ID Panel     Status: None (Preliminary result)   Collection Time: 04/19/2017  6:59 PM  Result Value Ref Range   Specimen Description BLOOD LEFT ARM    Special Requests      BOTTLES DRAWN AEROBIC AND ANAEROBIC Blood Culture adequate volume   Culture NO GROWTH < 12 HOURS    Report Status PENDING   Lactic acid, plasma     Status: Abnormal   Collection Time: 04/30/2017  6:59 PM  Result Value Ref Range   Lactic Acid, Venous 5.0 (HH) 0.5 - 1.9  mmol/L    Comment: CRITICAL RESULT CALLED TO, READ BACK BY AND VERIFIED WITH: HERAN,J AT 2000 ON 11.19.2018 BY ISLEY,B   Glucose, capillary     Status: Abnormal   Collection Time: 04/08/2017  7:34 PM  Result Value Ref Range   Glucose-Capillary 222 (H) 65 - 99 mg/dL   Comment 1 Notify RN   Glucose, capillary     Status: Abnormal   Collection Time: 04/10/2017  8:41 PM  Result Value Ref Range   Glucose-Capillary 227 (H) 65 - 99 mg/dL   Comment 1 Notify RN   Glucose, capillary     Status: Abnormal   Collection Time: 04/08/2017  9:46 PM  Result Value Ref Range   Glucose-Capillary 160 (H) 65 - 99 mg/dL   Comment 1 Notify RN   Glucose, capillary     Status: Abnormal   Collection Time: 04/12/2017 10:46 PM  Result Value Ref Range   Glucose-Capillary 149 (H) 65 - 99 mg/dL   Comment 1 Notify RN   Glucose, capillary     Status: Abnormal   Collection Time: 04/12/2017 11:52 PM  Result Value Ref Range   Glucose-Capillary 225 (H) 65 - 99 mg/dL   Comment 1 Notify RN   Basic metabolic panel     Status: Abnormal   Collection Time: 04/23/17 12:25 AM  Result Value Ref Range   Sodium 135 135 - 145 mmol/L   Potassium 2.6 (LL) 3.5 - 5.1 mmol/L    Comment: CRITICAL RESULT CALLED TO, READ BACK BY AND VERIFIED WITH: HEARN,J @0152  BY MATTHEWS B 11.20.18 DELTA CHECK NOTED    Chloride 112 (H) 101 - 111 mmol/L   CO2 19 (L) 22 - 32 mmol/L   Glucose, Bld 764 (HH) 65 - 99 mg/dL    Comment: CRITICAL RESULT CALLED TO,  READ BACK BY AND VERIFIED WITH: HEARN,J @ 0152 BY MATTHEWS, B 11.20.18    BUN 18 6 - 20 mg/dL   Creatinine, Ser 2.91 (H) 0.61 - 1.24 mg/dL   Calcium 5.6 (LL) 8.9 - 10.3 mg/dL    Comment: DELTA CHECK NOTED CRITICAL RESULT CALLED TO, READ BACK BY AND VERIFIED WITH: HEARN,J @ 0152BY MATTHEWS, B 11.20.18     GFR calc non Af Amer 24 (L) >60 mL/min   GFR calc Af Amer 28 (L) >60 mL/min    Comment: (NOTE) The eGFR has been calculated using the CKD EPI equation. This calculation has not been validated  in all clinical situations. eGFR's persistently <60 mL/min signify possible Chronic Kidney Disease.    Anion gap 4 (L) 5 - 15  Glucose, capillary     Status: Abnormal   Collection Time: 04/23/17 12:42 AM  Result Value Ref Range   Glucose-Capillary 164 (H) 65 - 99 mg/dL   Comment 1 Notify RN   Blood gas, arterial     Status: Abnormal   Collection Time: 04/23/17  1:20 AM  Result Value Ref Range   FIO2 100.00    Delivery systems VENTILATOR    Mode PRESSURE REGULATED VOLUME CONTROL    VT 550 mL   LHR 14 resp/min   Peep/cpap 10.0 cm H20   pH, Arterial 7.218 (L) 7.350 - 7.450   pCO2 arterial 58.0 (H) 32.0 - 48.0 mmHg   pO2, Arterial 96.0 83.0 - 108.0 mmHg   Bicarbonate 20.0 20.0 - 28.0 mmol/L   Acid-base deficit 4.0 (H) 0.0 - 2.0 mmol/L   O2 Saturation 95.3 %   Patient temperature 38.0    Collection site LEFT RADIAL    Drawn by [id number]    Sample type ARTERIAL    Allens test (pass/fail) PASS PASS  Glucose, capillary     Status: Abnormal   Collection Time: 04/23/17  1:40 AM  Result Value Ref Range   Glucose-Capillary 141 (H) 65 - 99 mg/dL   Comment 1 Notify RN   Basic metabolic panel     Status: Abnormal   Collection Time: 04/23/17  2:01 AM  Result Value Ref Range   Sodium 146 (H) 135 - 145 mmol/L    Comment: DELTA CHECK NOTED   Potassium 4.3 3.5 - 5.1 mmol/L    Comment: DELTA CHECK NOTED   Chloride 116 (H) 101 - 111 mmol/L   CO2 25 22 - 32 mmol/L   Glucose, Bld 145 (H) 65 - 99 mg/dL   BUN 24 (H) 6 - 20 mg/dL   Creatinine, Ser 3.87 (H) 0.61 - 1.24 mg/dL   Calcium 8.0 (L) 8.9 - 10.3 mg/dL    Comment: DELTA CHECK NOTED   GFR calc non Af Amer 17 (L) >60 mL/min   GFR calc Af Amer 20 (L) >60 mL/min    Comment: (NOTE) The eGFR has been calculated using the CKD EPI equation. This calculation has not been validated in all clinical situations. eGFR's persistently <60 mL/min signify possible Chronic Kidney Disease.    Anion gap 5 5 - 15  CBC     Status: Abnormal   Collection  Time: 04/23/17  2:01 AM  Result Value Ref Range   WBC 18.6 (H) 4.0 - 10.5 K/uL   RBC 4.77 4.22 - 5.81 MIL/uL   Hemoglobin 13.4 13.0 - 17.0 g/dL   HCT 40.9 39.0 - 52.0 %   MCV 85.7 78.0 - 100.0 fL   MCH 28.1 26.0 - 34.0 pg  MCHC 32.8 30.0 - 36.0 g/dL   RDW 14.5 11.5 - 15.5 %   Platelets 210 150 - 400 K/uL  Glucose, capillary     Status: Abnormal   Collection Time: 04/23/17  2:46 AM  Result Value Ref Range   Glucose-Capillary 143 (H) 65 - 99 mg/dL   Comment 1 Notify RN   Glucose, capillary     Status: Abnormal   Collection Time: 04/23/17  3:38 AM  Result Value Ref Range   Glucose-Capillary 157 (H) 65 - 99 mg/dL   Comment 1 Notify RN   Glucose, capillary     Status: Abnormal   Collection Time: 04/23/17  4:49 AM  Result Value Ref Range   Glucose-Capillary 148 (H) 65 - 99 mg/dL  Glucose, capillary     Status: Abnormal   Collection Time: 04/23/17  5:46 AM  Result Value Ref Range   Glucose-Capillary 153 (H) 65 - 99 mg/dL   Comment 1 Notify RN   Basic metabolic panel     Status: Abnormal   Collection Time: 04/23/17  6:23 AM  Result Value Ref Range   Sodium 141 135 - 145 mmol/L   Potassium 5.5 (H) 3.5 - 5.1 mmol/L    Comment: DELTA CHECK NOTED   Chloride 110 101 - 111 mmol/L   CO2 24 22 - 32 mmol/L   Glucose, Bld 157 (H) 65 - 99 mg/dL   BUN 26 (H) 6 - 20 mg/dL   Creatinine, Ser 4.66 (H) 0.61 - 1.24 mg/dL   Calcium 7.5 (L) 8.9 - 10.3 mg/dL   GFR calc non Af Amer 14 (L) >60 mL/min   GFR calc Af Amer 16 (L) >60 mL/min    Comment: (NOTE) The eGFR has been calculated using the CKD EPI equation. This calculation has not been validated in all clinical situations. eGFR's persistently <60 mL/min signify possible Chronic Kidney Disease.    Anion gap 7 5 - 15  Glucose, capillary     Status: Abnormal   Collection Time: 04/23/17  6:33 AM  Result Value Ref Range   Glucose-Capillary 220 (H) 65 - 99 mg/dL   Comment 1 Notify RN   Glucose, capillary     Status: Abnormal   Collection  Time: 04/23/17  7:39 AM  Result Value Ref Range   Glucose-Capillary 161 (H) 65 - 99 mg/dL    ABGS Recent Labs    04/07/2017 0911  04/23/17 0120  PHART  --    < > 7.218*  PO2ART  --    < > 96.0  TCO2 27  --   --   HCO3  --    < > 20.0   < > = values in this interval not displayed.   CULTURES Recent Results (from the past 240 hour(s))  MRSA PCR Screening     Status: None   Collection Time: 04/20/2017  3:32 PM  Result Value Ref Range Status   MRSA by PCR NEGATIVE NEGATIVE Final    Comment:        The GeneXpert MRSA Assay (FDA approved for NASAL specimens only), is one component of a comprehensive MRSA colonization surveillance program. It is not intended to diagnose MRSA infection nor to guide or monitor treatment for MRSA infections.   Culture, expectorated sputum-assessment     Status: None   Collection Time: 05/03/2017  4:40 PM  Result Value Ref Range Status   Specimen Description SPU  Final   Special Requests SPU  Final   Sputum evaluation THIS  SPECIMEN IS ACCEPTABLE FOR SPUTUM CULTURE  Final   Report Status 04/23/2017 FINAL  Final  Culture, blood (Routine X 2) w Reflex to ID Panel     Status: None (Preliminary result)   Collection Time: 04/27/2017  6:58 PM  Result Value Ref Range Status   Specimen Description BLOOD RIGHT ARM  Final   Special Requests BOTTLES DRAWN AEROBIC AND ANAEROBIC  Final   Culture NO GROWTH < 12 HOURS  Final   Report Status PENDING  Incomplete  Culture, blood (Routine X 2) w Reflex to ID Panel     Status: None (Preliminary result)   Collection Time: 05/01/2017  6:59 PM  Result Value Ref Range Status   Specimen Description BLOOD LEFT ARM  Final   Special Requests   Final    BOTTLES DRAWN AEROBIC AND ANAEROBIC Blood Culture adequate volume   Culture NO GROWTH < 12 HOURS  Final   Report Status PENDING  Incomplete   Studies/Results: Ct Head Wo Contrast  Result Date: 04/10/2017 CLINICAL DATA:  Seizure. Head trauma, minor, GCS>=13, high clinical  risk, initial exam EXAM: CT HEAD WITHOUT CONTRAST TECHNIQUE: Contiguous axial images were obtained from the base of the skull through the vertex without intravenous contrast. COMPARISON:  10/28/2014 FINDINGS: Brain: No evidence of acute infarction, hemorrhage, hydrocephalus, extra-axial collection, or mass lesion/mass effect. Stable mild to moderate diffuse cerebral atrophy. Vascular:  No hyperdense vessel or other acute findings. Skull: No evidence of fracture or other significant bone abnormality. Sinuses/Orbits: Air-fluid levels seen throughout the visualized paranasal sinuses, consistent with acute sinusitis. Other: None. IMPRESSION: No acute intracranial abnormality.  Stable cerebral atrophy. Acute pansinusitis. Electronically Signed   By: Earle Gell M.D.   On: 04/04/2017 11:13   Dg Chest Portable 1 View  Result Date: 04/09/2017 CLINICAL DATA:  Seizure-like activity. Blood around the mouth. Combative. The patient underwent CPR and is now intubated. EXAM: PORTABLE CHEST 1 VIEW COMPARISON:  Chest x-ray of April 19, 2004 FINDINGS: The lungs are reasonably well inflated. The interstitial markings are increased. There is no pneumothorax or pleural effusion. The heart is normal in size. The pulmonary vascularity is indistinct. The endotracheal tube lies 4 cm above the carina. The esophagogastric tube tip and proximal port lie below the GE junction. The observed bony thorax is unremarkable. IMPRESSION: Borderline hypoinflation. Mild central pulmonary vascular congestion. No alveolar pneumonia. The endotracheal tube and esophagogastric tube are in reasonable position. Electronically Signed   By: David  Martinique M.D.   On: 04/20/2017 10:00   Dg Chest Port 1v Same Day  Result Date: 04/23/2017 CLINICAL DATA:  Initial evaluation for acute respiratory failure. EXAM: PORTABLE CHEST 1 VIEW COMPARISON:  Prior radiograph from earlier the same day. FINDINGS: Endotracheal tube in place with tip positioned 4.2 cm above  the carina. Enteric tube courses in the the abdomen. Right-sided PICC catheter in place with tip overlying the distal SVC. Cardiac and mediastinal silhouettes are partially obscured, but grossly stable. Interval development of extensive multifocal bilateral airspace opacities, left greater than right, which may reflect progressive infiltrates and/ or asymmetric edema. Suspected left pleural effusion. No pneumothorax. No acute osseous abnormality. IMPRESSION: 1. Interval development of extensive bilateral airspace opacities, left worse than right. Given the fairly rapid development of these findings, asymmetric pulmonary edema is favored. Rapidly progressive infiltrates and/or aspiration could also be considered. 2. Suspected left pleural effusion. Electronically Signed   By: Jeannine Boga M.D.   On: 04/23/2017 00:09    Medications:  Prior to Admission:  Medications Prior to Admission  Medication Sig Dispense Refill Last Dose  . ADVAIR DISKUS 250-50 MCG/DOSE AEPB Inhale 1 puff into the lungs 2 (two) times daily as needed.    2 Month Ago  . cephALEXin (KEFLEX) 500 MG capsule Take 1 capsule (500 mg total) by mouth 4 (four) times daily. 28 capsule 0   . glipiZIDE (GLUCOTROL) 10 MG tablet Take 1 tablet (10 mg total) by mouth 2 (two) times daily before a meal. 60 tablet 0   . HYDROcodone-acetaminophen (NORCO) 5-325 MG tablet Take 1-2 tablets by mouth every 6 (six) hours as needed. 15 tablet 0   . lisinopril-hydrochlorothiazide (PRINZIDE,ZESTORETIC) 20-12.5 MG per tablet Take 1 tablet by mouth 2 (two) times daily. 60 tablet 0   . metFORMIN (GLUCOPHAGE) 1000 MG tablet Take 1 tablet (1,000 mg total) by mouth 2 (two) times daily with a meal. 60 tablet 0    Scheduled: . chlorhexidine gluconate (MEDLINE KIT)  15 mL Mouth Rinse BID  . enoxaparin (LOVENOX) injection  40 mg Subcutaneous Q24H  . insulin regular  0-10 Units Intravenous TID WC  . levalbuterol  0.63 mg Nebulization Q6H  . mouth rinse  15 mL  Mouth Rinse QID  . methylPREDNISolone (SOLU-MEDROL) injection  40 mg Intravenous Q6H  . pantoprazole (PROTONIX) IV  40 mg Intravenous Q24H   Continuous: . sodium chloride Stopped (04/21/2017 2003)  . sodium chloride    . dextrose 5 % and 0.45% NaCl 100 mL/hr at 04/23/17 0436  . fentaNYL infusion INTRAVENOUS 200 mcg/hr (04/23/17 0458)  . insulin (NOVOLIN-R) infusion 11.2 Units/hr (04/23/17 7412)  . levETIRAcetam Stopped (04/25/2017 2232)  . midazolam (VERSED) infusion 1 mg/hr (04/18/2017 2112)  . phenylephrine (NEO-SYNEPHRINE) Adult infusion 50 mcg/min (04/23/17 0654)   INO:[id number] **OR** acetaminophen, dextrose, ondansetron **OR** ondansetron (ZOFRAN) IV, senna-docusate  Assesment: He was admitted with seizure disorder altered mental status.  He appears to be septic now.  He aspirated.  He has acute respiratory failure requiring ventilator support.  He suffered cardiopulmonary arrest in the emergency department.  He is requiring high level of PEEP and high FiO2. Principal Problem:   Seizure (Start) Active Problems:   HTN (hypertension)   Hypotension   Hyperosmolar (nonketotic) coma (HCC)   Acute on chronic respiratory failure with hypercapnia (HCC)    Plan: Continue current treatments.  He is obviously critically ill.  Still with plans to transfer to [facility] when bed available.    LOS: 1 day   Zayana Salvador L 04/23/2017, 8:00 AM

## 2017-04-23 NOTE — Progress Notes (Addendum)
eLink Physician-Brief Progress Note Patient Name: Ricardo HighlandJohn W Canela DOB: May 28, 1971 MRN: [id number]   Date of Service  04/23/2017  HPI/Events of Note  Multiple issues: 1. Review of CXR - Worsening of diffuse bilateral airspace disease. Suspect aspiration. 2. Notified of need for stress ulcer prophylaxis.   eICU Interventions  Will order: 1. Continue current ventilator support. Sat now up to 94% with increase in PEEP. 2. Unasyn per pharmacy consult for possible aspiration pneumonia.  3. Protonix IV.      Intervention Category Major Interventions: Hypoxemia - evaluation and management  Latrail Pounders [place] 04/23/2017, 12:31 AM

## 2017-04-23 NOTE — Consult Note (Signed)
Consult requested by: Dr. Jerilee Hoh Consult requested for: Respiratory failure  HPI: This is completion of the consult note from last night.  He came to the emergency department after his mother found him to be confused and staggering around the house.  EMS was called and during EMS transport he suffered 2 seizures became combative and aggressive he had another seizure in the emergency department had PEA arrest and CPR he was intubated and during intubation he was vomiting and likely aspirated.  Blood sugar was greater than 600.  Creatinine was 1.54.  Chest x-ray initially did not show pneumonia CT of the head showed no acute intracranial abnormality but acute pansinusitis.  He was started on propofol in the emergency department but became more hypotensive.  Plans were made to transfer him to [facility] but no beds were available so he has been admitted here.  He is now on Unasyn for presumed aspiration, fentanyl, insulin drip Keppra Solu-Medrol Neo-Synephrine and receiving IV fluids.  He is now on 90% oxygen with 10 centimeters of PEEP.  History is obtained from the medical record and nursing staff.  He is intubated and sedated and there is no family available.  He apparently is known to have asthma, diabetes hypertension and seizure disorder.  Past Medical History:  Diagnosis Date  . Asthma   . Diabetes mellitus without complication ([place])   . HTN (hypertension)   . Seizures ([place])      Family History  Problem Relation Age of Onset  . Cancer Other   . Diabetes Other   . Hypertension Other     Family history from the medical record it is not clear if anyone in the family has respiratory disease. Social History   Socioeconomic History  . Marital status: Divorced    Spouse name: None  . Number of children: None  . Years of education: None  . Highest education level: None  Social Needs  . Financial resource strain: None  . Food insecurity - worry: None  . Food insecurity - inability:  None  . Transportation needs - medical: None  . Transportation needs - non-medical: None  Occupational History  . None  Tobacco Use  . Smoking status: Never Smoker  . Smokeless tobacco: Never Used  Substance and Sexual Activity  . Alcohol use: No  . Drug use: No  . Sexual activity: None  Other Topics Concern  . None  Social History Narrative  . None     ROS: Unobtainable    Objective: Vital signs in last 24 hours: Temp:  [98.9 F (37.2 C)-101.3 F (38.5 C)] 99.9 F (37.7 C) (11/20 0737) Pulse Rate:  [106-143] 119 (11/20 0737) Resp:  [7-50] 15 (11/20 0737) BP: (67-267)/(42-136) 101/79 (11/20 0700) SpO2:  [83 %-100 %] 93 % (11/20 0737) FiO2 (%):  [60 %-100 %] 90 % (11/20 0700) Weight:  [89.6 kg (197 lb 8.5 oz)-90.7 kg (200 lb)] 89.6 kg (197 lb 8.5 oz) (11/19 1501) Weight change:     Intake/Output from previous day: 11/19 0701 - 11/20 0700 In: 4825.5 [I.V.:4125.5; IV Piggyback:700] Out: 1175 [Urine:975; Emesis/NG output:200]  PHYSICAL EXAM Constitutional: He is intubated sedated tachycardic.  Eyes: Pupils react ears nose mouth and throat: He has endotracheal tube and feeding tube in his mouth.  Mucous membranes are dry.  Cardiovascular: His heart is regular with tachycardia up to about 140.  No gallop.  Respiratory: He has fairly clear lungs with prolonged expiratory phase.  Gastrointestinal: His abdomen is soft.  Skin:  Skin turgor is fair.  Musculoskeletal: Not able to assess.  Neurological: Not able to assess.  Psychiatric: Not able to assess  Lab Results: Basic Metabolic Panel: Recent Labs    04/23/17 0201 04/23/17 0623  NA 146* 141  K 4.3 5.5*  CL 116* 110  CO2 25 24  GLUCOSE 145* 157*  BUN 24* 26*  CREATININE 3.87* 4.66*  CALCIUM 8.0* 7.5*   Liver Function Tests: Recent Labs    04/14/2017 0908  AST 25  ALT 26  ALKPHOS 114  BILITOT 0.8  PROT 8.4*  ALBUMIN 4.0   No results for input(s): LIPASE, AMYLASE in the last 72 hours. No results for  input(s): AMMONIA in the last 72 hours. CBC: Recent Labs    04/18/2017 0908 04/23/2017 0911 04/23/17 0201  WBC 18.9*  --  18.6*  NEUTROABS 13.9*  --   --   HGB 15.5 16.3 13.4  HCT 44.7 48.0 40.9  MCV 80.7  --  85.7  PLT 296  --  210   Cardiac Enzymes: Recent Labs    05/02/2017 0911  TROPONINI <0.03   BNP: No results for input(s): PROBNP in the last 72 hours. D-Dimer: No results for input(s): DDIMER in the last 72 hours. CBG: Recent Labs    04/23/17 0246 04/23/17 0338 04/23/17 0449 04/23/17 0546 04/23/17 0633 04/23/17 0739  GLUCAP 143* 157* 148* 153* 220* 161*   Hemoglobin A1C: No results for input(s): HGBA1C in the last 72 hours. Fasting Lipid Panel: No results for input(s): CHOL, HDL, LDLCALC, TRIG, CHOLHDL, LDLDIRECT in the last 72 hours. Thyroid Function Tests: No results for input(s): TSH, T4TOTAL, FREET4, T3FREE, THYROIDAB in the last 72 hours. Anemia Panel: No results for input(s): VITAMINB12, FOLATE, FERRITIN, TIBC, IRON, RETICCTPCT in the last 72 hours. Coagulation: No results for input(s): LABPROT, INR in the last 72 hours. Urine Drug Screen: Drugs of Abuse     Component Value Date/Time   LABOPIA NONE DETECTED 05/01/2017 1024   COCAINSCRNUR NONE DETECTED 04/15/2017 1024   LABBENZ NONE DETECTED 04/26/2017 1024   AMPHETMU NONE DETECTED 04/26/2017 1024   THCU NONE DETECTED 04/07/2017 1024   LABBARB NONE DETECTED 04/12/2017 1024    Alcohol Level: Recent Labs    04/15/2017 0908  ETH <10   Urinalysis: Recent Labs    04/20/2017 1024  COLORURINE STRAW*  LABSPEC 1.020  PHURINE 5.0  GLUCOSEU >=500*  HGBUR MODERATE*  BILIRUBINUR NEGATIVE  KETONESUR NEGATIVE  PROTEINUR 100*  NITRITE NEGATIVE  LEUKOCYTESUR NEGATIVE   Misc. Labs:   ABGS: Recent Labs    04/08/2017 0911  04/23/17 0120  PHART  --    < > 7.218*  PO2ART  --    < > 96.0  TCO2 27  --   --   HCO3  --    < > 20.0   < > = values in this interval not displayed.      MICROBIOLOGY: Recent Results (from the past 240 hour(s))  MRSA PCR Screening     Status: None   Collection Time: 05/01/2017  3:32 PM  Result Value Ref Range Status   MRSA by PCR NEGATIVE NEGATIVE Final    Comment:        The GeneXpert MRSA Assay (FDA approved for NASAL specimens only), is one component of a comprehensive MRSA colonization surveillance program. It is not intended to diagnose MRSA infection nor to guide or monitor treatment for MRSA infections.   Culture, expectorated sputum-assessment     Status: None  Collection Time: 04/30/2017  4:40 PM  Result Value Ref Range Status   Specimen Description SPU  Final   Special Requests SPU  Final   Sputum evaluation THIS SPECIMEN IS ACCEPTABLE FOR SPUTUM CULTURE  Final   Report Status 04/23/2017 FINAL  Final  Culture, blood (Routine X 2) w Reflex to ID Panel     Status: None (Preliminary result)   Collection Time: 04/08/2017  6:58 PM  Result Value Ref Range Status   Specimen Description BLOOD RIGHT ARM  Final   Special Requests BOTTLES DRAWN AEROBIC AND ANAEROBIC  Final   Culture NO GROWTH < 12 HOURS  Final   Report Status PENDING  Incomplete  Culture, blood (Routine X 2) w Reflex to ID Panel     Status: None (Preliminary result)   Collection Time: 04/28/2017  6:59 PM  Result Value Ref Range Status   Specimen Description BLOOD LEFT ARM  Final   Special Requests   Final    BOTTLES DRAWN AEROBIC AND ANAEROBIC Blood Culture adequate volume   Culture NO GROWTH < 12 HOURS  Final   Report Status PENDING  Incomplete    Studies/Results: Ct Head Wo Contrast  Result Date: 05/02/2017 CLINICAL DATA:  Seizure. Head trauma, minor, GCS>=13, high clinical risk, initial exam EXAM: CT HEAD WITHOUT CONTRAST TECHNIQUE: Contiguous axial images were obtained from the base of the skull through the vertex without intravenous contrast. COMPARISON:  10/28/2014 FINDINGS: Brain: No evidence of acute infarction, hemorrhage, hydrocephalus,  extra-axial collection, or mass lesion/mass effect. Stable mild to moderate diffuse cerebral atrophy. Vascular:  No hyperdense vessel or other acute findings. Skull: No evidence of fracture or other significant bone abnormality. Sinuses/Orbits: Air-fluid levels seen throughout the visualized paranasal sinuses, consistent with acute sinusitis. Other: None. IMPRESSION: No acute intracranial abnormality.  Stable cerebral atrophy. Acute pansinusitis. Electronically Signed   By: Earle Gell M.D.   On: 04/09/2017 11:13   Dg Chest Portable 1 View  Result Date: 04/09/2017 CLINICAL DATA:  Seizure-like activity. Blood around the mouth. Combative. The patient underwent CPR and is now intubated. EXAM: PORTABLE CHEST 1 VIEW COMPARISON:  Chest x-ray of April 19, 2004 FINDINGS: The lungs are reasonably well inflated. The interstitial markings are increased. There is no pneumothorax or pleural effusion. The heart is normal in size. The pulmonary vascularity is indistinct. The endotracheal tube lies 4 cm above the carina. The esophagogastric tube tip and proximal port lie below the GE junction. The observed bony thorax is unremarkable. IMPRESSION: Borderline hypoinflation. Mild central pulmonary vascular congestion. No alveolar pneumonia. The endotracheal tube and esophagogastric tube are in reasonable position. Electronically Signed   By: David  Martinique M.D.   On: 04/20/2017 10:00   Dg Chest Port 1v Same Day  Result Date: 04/23/2017 CLINICAL DATA:  Initial evaluation for acute respiratory failure. EXAM: PORTABLE CHEST 1 VIEW COMPARISON:  Prior radiograph from earlier the same day. FINDINGS: Endotracheal tube in place with tip positioned 4.2 cm above the carina. Enteric tube courses in the the abdomen. Right-sided PICC catheter in place with tip overlying the distal SVC. Cardiac and mediastinal silhouettes are partially obscured, but grossly stable. Interval development of extensive multifocal bilateral airspace  opacities, left greater than right, which may reflect progressive infiltrates and/ or asymmetric edema. Suspected left pleural effusion. No pneumothorax. No acute osseous abnormality. IMPRESSION: 1. Interval development of extensive bilateral airspace opacities, left worse than right. Given the fairly rapid development of these findings, asymmetric pulmonary edema is favored. Rapidly progressive infiltrates and/or  aspiration could also be considered. 2. Suspected left pleural effusion. Electronically Signed   By: Jeannine Boga M.D.   On: 04/23/2017 00:09    Medications:  Prior to Admission:  Medications Prior to Admission  Medication Sig Dispense Refill Last Dose  . ADVAIR DISKUS 250-50 MCG/DOSE AEPB Inhale 1 puff into the lungs 2 (two) times daily as needed.    2 Month Ago  . cephALEXin (KEFLEX) 500 MG capsule Take 1 capsule (500 mg total) by mouth 4 (four) times daily. 28 capsule 0   . glipiZIDE (GLUCOTROL) 10 MG tablet Take 1 tablet (10 mg total) by mouth 2 (two) times daily before a meal. 60 tablet 0   . HYDROcodone-acetaminophen (NORCO) 5-325 MG tablet Take 1-2 tablets by mouth every 6 (six) hours as needed. 15 tablet 0   . lisinopril-hydrochlorothiazide (PRINZIDE,ZESTORETIC) 20-12.5 MG per tablet Take 1 tablet by mouth 2 (two) times daily. 60 tablet 0   . metFORMIN (GLUCOPHAGE) 1000 MG tablet Take 1 tablet (1,000 mg total) by mouth 2 (two) times daily with a meal. 60 tablet 0    Scheduled: . chlorhexidine gluconate (MEDLINE KIT)  15 mL Mouth Rinse BID  . enoxaparin (LOVENOX) injection  40 mg Subcutaneous Q24H  . insulin regular  0-10 Units Intravenous TID WC  . levalbuterol  0.63 mg Nebulization Q6H  . mouth rinse  15 mL Mouth Rinse QID  . methylPREDNISolone (SOLU-MEDROL) injection  40 mg Intravenous Q6H  . pantoprazole (PROTONIX) IV  40 mg Intravenous Q24H   Continuous: . sodium chloride Stopped (05/02/2017 2003)  . sodium chloride    . dextrose 5 % and 0.45% NaCl 100 mL/hr at  04/23/17 0436  . fentaNYL infusion INTRAVENOUS 200 mcg/hr (04/23/17 0458)  . insulin (NOVOLIN-R) infusion 11.2 Units/hr (04/23/17 4975)  . levETIRAcetam Stopped (04/17/2017 2232)  . midazolam (VERSED) infusion 1 mg/hr (04/11/2017 2112)  . phenylephrine (NEO-SYNEPHRINE) Adult infusion 50 mcg/min (04/23/17 0654)   PYY:[id number] **OR** acetaminophen, dextrose, ondansetron **OR** ondansetron (ZOFRAN) IV, senna-docusate  Assesment: He was admitted with acute on chronic hypercapnic respiratory failure.  He had hypotension.  He had seizures.  He suffered cardiopulmonary arrest.  He is obviously critically ill. Principal Problem:   Seizure ([place]) Active Problems:   HTN (hypertension)   Hypotension   Hyperosmolar (nonketotic) coma ([facility])   Acute on chronic respiratory failure with hypercapnia ([facility])    Plan: I added steroids because of his diabetes.  Added Versed to help with his sedation and neb treatments.    LOS: 1 day   Teirra Carapia L 04/23/2017, 7:52 AM

## 2017-04-23 NOTE — Progress Notes (Signed)
Nurse called about pt spo2 in ;ow to mid 80's. Pt checked, machine checked, pt suctioned and obtained small amount thick yellow secretion. E-link MD called and orders given with vent changes, abg, extra treatment, xray and saline lavage. Pt turned off left side and suction large amount of thick yellow secretions. Recruitment maneuver done and pt spo2 increased to 94% Nurse informed of procedures and pt resting comfortable

## 2017-04-23 NOTE — Progress Notes (Signed)
I was told pts sister called to check on him and I returned phone call from number that is located in the chart. No answer.

## 2017-04-23 NOTE — Procedures (Signed)
EEG Report  Clinical History:  Recurrent seizures, cardiopulmonary arrest requiring CPR, in the setting of poorly controlled diabetes.  Technical Summary:  A 19 channel digital EEG recording was performed using the 10-20 international system of electrode placement.  Bipolar and Referential montages were used.  The total recording time was approx 20 minutes.  Findings:  This is a very low voltage recording off sedation.  Background frequencies are in the 3-4 Hz range and symmetrical.  The patient has no response to stimulation and EEG shows no reactivity to stimulation.  There are no epileptiform discharges or electrographic seizures.    Impression:  This is an abnormal EEG.  There is severe generalized slowing of brain activity, which is non-specific but may be due to global anoxic brain injury, toxic, metabolic, or infectious etiologies.  The patient is not in non-convulsive status epilepticus.    Weston SettleShervin Tearah Saulsbury, MS, MD

## 2017-04-23 NOTE — Progress Notes (Signed)
The patient was re-examined by 5 min of sedation.  Pupils are in our reactive and corneal reflexes are intact.  He still is unresponsive to deep painful stimuli and coughing gag reflexes are not observed at this point.  Family was at the bedside and they reports that there is no family history of seizures.  He had another event again related to hyperglycemia year ago.  They think that he was not compliant with antihypertensive or diabetic medications.  This may be a case of provoked seizures due to hyperglycemia.  However, given the severe complications I suggest that the patient be maintained on long-term antiseizure medications.

## 2017-04-23 NOTE — Progress Notes (Signed)
EEG Completed; Results Pending  

## 2017-04-23 NOTE — Progress Notes (Signed)
[facility] ABG results given to CCM NP.  Per CCM- initiate ARDS protocol.  ICU RT aware of above.

## 2017-04-23 NOTE — Progress Notes (Signed)
Called for EEG. Pt is unavailable. Pt going to CT. Will attempt at a different time as schedule permits

## 2017-04-23 NOTE — Consult Note (Addendum)
Referring Physician: Dr. Ashok Cordia    Chief Complaint: Seizure recurrence and new stroke on CT head  HPI: Ricardo Rogers is an 46 y.o. male who presented to [facility] yesterday after seizure like activity was noted by his mother. He had a second seizure witnessed by EMS. He was combative on arrival with blood to mouth noted. His CBG was 438 at that time. Other reported values were > 600. A third seizure was noted shortly after arrival. In the ED, he had bradycardia into the 30's; CPR briefly done, possibly 3 minutes. He was deemed a risk for respiratory compromise and was intubated. During intubation copious vomiting of grape soda (per family he consumes large amounts at home) and it was felt most likely that he aspirated. He was placed on an insulin drip and loaded with 1000 mg Keppra. He has a known history of seizure related to severe hyperglycemia in the past; he was not on an anticonvulsant at home. Per chart review, he had a seizure approximately one year ago which was thought to be related to ischemia.   In the ICU he was hypoxemic with sats of 86% increased with adjustment of vent settings. He had low BP and decreased urinary output as well as a temp of 101.3 despite Tylenol. His clinical picture was consistent with acute on chronic hypercapnic respiratory failure. Active problems included hypertension, hypotension, HONK and acute on chronic respiratory failure with hypercapnia. Neosynephrine was required to maintain his BP. He was started on antibiotics.   His PMHx includes DM, HTN and asthma.  Past Medical History:  Diagnosis Date  . Asthma   . Diabetes mellitus without complication ([place])   . HTN (hypertension)   . Seizures ([place])     History reviewed. No pertinent surgical history.  Family History  Problem Relation Age of Onset  . Cancer Other   . Diabetes Other   . Hypertension Other    Social History:  reports that  has never smoked. he has never used smokeless tobacco. He reports  that he does not drink alcohol or use drugs.  Allergies: No Known Allergies  Medications:  Scheduled: . atorvastatin  40 mg Per Tube q1800  . chlorhexidine gluconate (MEDLINE KIT)  15 mL Mouth Rinse BID  . heparin subcutaneous  5,000 Units Subcutaneous Q12H  . insulin aspart  2-6 Units Subcutaneous Q4H  . insulin glargine  15 Units Subcutaneous Q24H  . insulin regular  0-10 Units Intravenous TID WC  . mouth rinse  15 mL Mouth Rinse 10 times per day  . methylPREDNISolone (SOLU-MEDROL) injection  40 mg Intravenous Q6H  . pantoprazole (PROTONIX) IV  40 mg Intravenous Q24H   Continuous: . fentaNYL infusion INTRAVENOUS Stopped (04/23/17 1355)  . insulin (NOVOLIN-R) infusion 7.4 Units/hr (04/23/17 1653)  . levETIRAcetam    . phenylephrine (NEO-SYNEPHRINE) Adult infusion Stopped (04/23/17 1345)  . piperacillin-tazobactam (ZOSYN)  IV Stopped (04/23/17 1730)    ROS: Unable to obtain due to unresponsive state.   Physical Examination: Blood pressure 102/70, pulse (!) 118, temperature 100.3 F (37.9 C), temperature source Axillary, resp. rate 19, height 5' 10"  (1.778 m), weight 89.6 kg (197 lb 8.5 oz), SpO2 94 %.  HEENT: [place]/AT.  Lungs: Intubated Ext: Warm and well perfused  Neurologic Examination: Ment: Comatose. GCS 3. No movement or other response to auditory, light or noxious stimuli. CN: Pupils 3 mm and sluggishly reactive. No blink to threat. No blink to eyelid stimulation. Face flaccidly symmetric. Motor/Sensory: Flaccid tone x 4. No  movement to any stimuli. No asymmetry noted.  Reflexes: 0 upper and lower extremities. Toes mute.    Results for orders placed or performed during the hospital encounter of 05/02/2017 (from the past 48 hour(s))  CBC WITH DIFFERENTIAL     Status: Abnormal   Collection Time: 04/25/2017  9:08 AM  Result Value Ref Range   WBC 18.9 (H) 4.0 - 10.5 K/uL   RBC 5.54 4.22 - 5.81 MIL/uL   Hemoglobin 15.5 13.0 - 17.0 g/dL   HCT 44.7 39.0 - 52.0 %   MCV 80.7  78.0 - 100.0 fL   MCH 28.0 26.0 - 34.0 pg   MCHC 34.7 30.0 - 36.0 g/dL   RDW 13.7 11.5 - 15.5 %   Platelets 296 150 - 400 K/uL   Neutrophils Relative % 74 %   Neutro Abs 13.9 (H) 1.7 - 7.7 K/uL   Lymphocytes Relative 21 %   Lymphs Abs 4.0 0.7 - 4.0 K/uL   Monocytes Relative 5 %   Monocytes Absolute 0.9 0.1 - 1.0 K/uL   Eosinophils Relative 0 %   Eosinophils Absolute 0.1 0.0 - 0.7 K/uL   Basophils Relative 0 %   Basophils Absolute 0.0 0.0 - 0.1 K/uL  Comprehensive metabolic panel     Status: Abnormal   Collection Time: 04/10/2017  9:08 AM  Result Value Ref Range   Sodium 136 135 - 145 mmol/L   Potassium 3.1 (L) 3.5 - 5.1 mmol/L   Chloride 98 (L) 101 - 111 mmol/L   CO2 22 22 - 32 mmol/L   Glucose, Bld 560 (HH) 65 - 99 mg/dL    Comment: CRITICAL RESULT CALLED TO, READ BACK BY AND VERIFIED WITH: WHITE,M AT 9:456AM ON 04/14/2017 BY FESTERMAN,C    BUN 15 6 - 20 mg/dL   Creatinine, Ser 1.54 (H) 0.61 - 1.24 mg/dL   Calcium 9.6 8.9 - 10.3 mg/dL   Total Protein 8.4 (H) 6.5 - 8.1 g/dL   Albumin 4.0 3.5 - 5.0 g/dL   AST 25 15 - 41 U/L   ALT 26 17 - 63 U/L   Alkaline Phosphatase 114 38 - 126 U/L   Total Bilirubin 0.8 0.3 - 1.2 mg/dL   GFR calc non Af Amer 52 (L) >60 mL/min   GFR calc Af Amer 60 (L) >60 mL/min    Comment: (NOTE) The eGFR has been calculated using the CKD EPI equation. This calculation has not been validated in all clinical situations. eGFR's persistently <60 mL/min signify possible Chronic Kidney Disease.    Anion gap 16 (H) 5 - 15  Ethanol     Status: None   Collection Time: 04/21/2017  9:08 AM  Result Value Ref Range   Alcohol, Ethyl (B) <10 <10 mg/dL    Comment:        LOWEST DETECTABLE LIMIT FOR SERUM ALCOHOL IS 10 mg/dL FOR MEDICAL PURPOSES ONLY   I-stat chem 8, ed     Status: Abnormal   Collection Time: 04/24/2017  9:11 AM  Result Value Ref Range   Sodium 138 135 - 145 mmol/L   Potassium 3.8 3.5 - 5.1 mmol/L   Chloride 101 101 - 111 mmol/L   BUN 18 6 - 20  mg/dL   Creatinine, Ser 1.30 (H) 0.61 - 1.24 mg/dL   Glucose, Bld 590 (HH) 65 - 99 mg/dL   Calcium, Ion 1.20 1.15 - 1.40 mmol/L   TCO2 27 22 - 32 mmol/L   Hemoglobin 16.3 13.0 - 17.0 g/dL  HCT 48.0 39.0 - 52.0 %  Troponin I     Status: None   Collection Time: 04/08/2017  9:11 AM  Result Value Ref Range   Troponin I <0.03 <0.03 ng/mL  CBG monitoring, ED     Status: Abnormal   Collection Time: 04/17/2017  9:32 AM  Result Value Ref Range   Glucose-Capillary >600 (HH) 65 - 99 mg/dL  Urinalysis, Routine w reflex microscopic     Status: Abnormal   Collection Time: 05/01/2017 10:24 AM  Result Value Ref Range   Color, Urine STRAW (A) YELLOW   APPearance CLEAR CLEAR   Specific Gravity, Urine 1.020 1.005 - 1.030   pH 5.0 5.0 - 8.0   Glucose, UA >=500 (A) NEGATIVE mg/dL   Hgb urine dipstick MODERATE (A) NEGATIVE   Bilirubin Urine NEGATIVE NEGATIVE   Ketones, ur NEGATIVE NEGATIVE mg/dL   Protein, ur 100 (A) NEGATIVE mg/dL   Nitrite NEGATIVE NEGATIVE   Leukocytes, UA NEGATIVE NEGATIVE   RBC / HPF 0-5 0 - 5 RBC/hpf   WBC, UA 0-5 0 - 5 WBC/hpf   Bacteria, UA RARE (A) NONE SEEN   Squamous Epithelial / LPF 0-5 (A) NONE SEEN  Rapid urine drug screen (hospital performed)     Status: None   Collection Time: 04/25/2017 10:24 AM  Result Value Ref Range   Opiates NONE DETECTED NONE DETECTED   Cocaine NONE DETECTED NONE DETECTED   Benzodiazepines NONE DETECTED NONE DETECTED   Amphetamines NONE DETECTED NONE DETECTED   Tetrahydrocannabinol NONE DETECTED NONE DETECTED   Barbiturates NONE DETECTED NONE DETECTED    Comment:        DRUG SCREEN FOR MEDICAL PURPOSES ONLY.  IF CONFIRMATION IS NEEDED FOR ANY PURPOSE, NOTIFY LAB WITHIN 5 DAYS.        LOWEST DETECTABLE LIMITS FOR URINE DRUG SCREEN Drug Class       Cutoff (ng/mL) Amphetamine      1000 Barbiturate      200 Benzodiazepine   045 Tricyclics       997 Opiates          300 Cocaine          300 THC              50   Blood gas, arterial ([facility]  & [facility] ONLY)     Status: Abnormal   Collection Time: 04/17/2017 10:24 AM  Result Value Ref Range   FIO2 1.00    O2 Content 100.0 L/min   Delivery systems VENTILATOR    Mode PRESSURE REGULATED VOLUME CONTROL    VT 550 mL   LHR 14 resp/min   Peep/cpap 5.0 cm H20   pH, Arterial 7.234 (L) 7.350 - 7.450   pCO2 arterial 60.5 (H) 32.0 - 48.0 mmHg   pO2, Arterial 309 (H) 83.0 - 108.0 mmHg   Bicarbonate 21.4 20.0 - 28.0 mmol/L   Acid-base deficit 1.9 0.0 - 2.0 mmol/L   O2 Saturation 99.2 %   Collection site RIGHT RADIAL    Drawn by [phone]    Sample type ARTERIAL    Allens test (pass/fail) PASS PASS  CBG monitoring, ED     Status: Abnormal   Collection Time: 04/04/2017 11:24 AM  Result Value Ref Range   Glucose-Capillary >600 (HH) 65 - 99 mg/dL  CBG monitoring, ED     Status: Abnormal   Collection Time: 04/05/2017 12:36 PM  Result Value Ref Range   Glucose-Capillary >600 (HH) 65 - 99 mg/dL  Lactic acid,  plasma     Status: Abnormal   Collection Time: 04/08/2017 12:42 PM  Result Value Ref Range   Lactic Acid, Venous 2.0 (HH) 0.5 - 1.9 mmol/L    Comment: CRITICAL RESULT CALLED TO, READ BACK BY AND VERIFIED WITH: WHITE,M AT 1340 ON 11.19.2018 BY ISLEY,B   CBG monitoring, ED     Status: Abnormal   Collection Time: 04/11/2017  1:41 PM  Result Value Ref Range   Glucose-Capillary 559 (HH) 65 - 99 mg/dL   Comment 1 Document in Chart   Glucose, capillary     Status: Abnormal   Collection Time: 04/28/2017  3:01 PM  Result Value Ref Range   Glucose-Capillary 458 (H) 65 - 99 mg/dL   Comment 1 Notify RN    Comment 2 Document in Chart   MRSA PCR Screening     Status: None   Collection Time: 04/27/2017  3:32 PM  Result Value Ref Range   MRSA by PCR NEGATIVE NEGATIVE    Comment:        The GeneXpert MRSA Assay (FDA approved for NASAL specimens only), is one component of a comprehensive MRSA colonization surveillance program. It is not intended to diagnose MRSA infection nor to guide or monitor  treatment for MRSA infections.   Glucose, capillary     Status: Abnormal   Collection Time: 05/03/2017  3:52 PM  Result Value Ref Range   Glucose-Capillary 440 (H) 65 - 99 mg/dL  HIV antibody (Routine Testing)     Status: None   Collection Time: 04/04/2017  4:00 PM  Result Value Ref Range   HIV Screen 4th Generation wRfx Non Reactive Non Reactive    Comment: (NOTE) Performed At: [facility] [address] [place], [place] [id number] Rush Farmer MD TD:[id number]   Culture, expectorated sputum-assessment     Status: None   Collection Time: 04/07/2017  4:40 PM  Result Value Ref Range   Specimen Description SPU    Special Requests SPU    Sputum evaluation THIS SPECIMEN IS ACCEPTABLE FOR SPUTUM CULTURE    Report Status 04/23/2017 FINAL   Culture, respiratory (NON-Expectorated)     Status: None (Preliminary result)   Collection Time: 04/04/2017  4:40 PM  Result Value Ref Range   Specimen Description SPU    Special Requests SPU Reflexed from [id number]    Gram Stain      FEW WBC PRESENT, PREDOMINANTLY PMN RARE SQUAMOUS EPITHELIAL CELLS PRESENT MODERATE GRAM POSITIVE COCCI IN CHAINS MODERATE BUDDING YEAST SEEN FEW GRAM NEGATIVE RODS Performed at [facility], [place] [address] [place], [place] [address]    Culture PENDING    Report Status PENDING   Blood gas, arterial     Status: Abnormal   Collection Time: 04/24/2017  4:45 PM  Result Value Ref Range   FIO2 70.00    Delivery systems VENTILATOR    Mode PRESSURE REGULATED VOLUME CONTROL    VT 550 mL   LHR 14 resp/min   Peep/cpap 5.0 cm H20   pH, Arterial 7.309 (L) 7.350 - 7.450   pCO2 arterial 40.7 32.0 - 48.0 mmHg   pO2, Arterial 60.0 (L) 83.0 - 108.0 mmHg   Bicarbonate 19.7 (L) 20.0 - 28.0 mmol/L   Acid-base deficit 5.3 (H) 0.0 - 2.0 mmol/L   O2 Saturation 88.5 %   Patient temperature 37.0    Collection site RIGHT RADIAL    Drawn by [phone]    Sample type ARTERIAL DRAW    Allens test (pass/fail) PASS  PASS  Glucose,  capillary     Status: Abnormal   Collection Time: 04/21/2017  5:00 PM  Result Value Ref Range   Glucose-Capillary 386 (H) 65 - 99 mg/dL  Glucose, capillary     Status: Abnormal   Collection Time: 04/12/2017  6:02 PM  Result Value Ref Range   Glucose-Capillary 265 (H) 65 - 99 mg/dL  Glucose, capillary     Status: Abnormal   Collection Time: 05/03/2017  6:47 PM  Result Value Ref Range   Glucose-Capillary 195 (H) 65 - 99 mg/dL  Culture, blood (Routine X 2) w Reflex to ID Panel     Status: None (Preliminary result)   Collection Time: 04/08/2017  6:58 PM  Result Value Ref Range   Specimen Description BLOOD RIGHT ARM    Special Requests      BOTTLES DRAWN AEROBIC AND ANAEROBIC Blood Culture adequate volume   Culture  Setup Time      GRAM POSITIVE COCCI AEB BOTTLE Gram Stain Report Called to,Read Back By and Verified With: JAMIE WHITE, RN @ [facility] @ 1600 ON 11.20.18 BY BOWMAN,L    Culture PENDING    Report Status PENDING   Culture, blood (Routine X 2) w Reflex to ID Panel     Status: None (Preliminary result)   Collection Time: 04/09/2017  6:59 PM  Result Value Ref Range   Specimen Description BLOOD LEFT ARM    Special Requests      BOTTLES DRAWN AEROBIC AND ANAEROBIC Blood Culture adequate volume   Culture NO GROWTH < 24 HOURS    Report Status PENDING   Lactic acid, plasma     Status: Abnormal   Collection Time: 04/07/2017  6:59 PM  Result Value Ref Range   Lactic Acid, Venous 5.0 (HH) 0.5 - 1.9 mmol/L    Comment: CRITICAL RESULT CALLED TO, READ BACK BY AND VERIFIED WITH: HERAN,J AT 2000 ON 11.19.2018 BY ISLEY,B   Glucose, capillary     Status: Abnormal   Collection Time: 04/11/2017  7:34 PM  Result Value Ref Range   Glucose-Capillary 222 (H) 65 - 99 mg/dL   Comment 1 Notify RN   Glucose, capillary     Status: Abnormal   Collection Time: 04/20/2017  8:41 PM  Result Value Ref Range   Glucose-Capillary 227 (H) 65 - 99 mg/dL   Comment 1 Notify RN   Glucose, capillary     Status: Abnormal    Collection Time: 04/28/2017  9:46 PM  Result Value Ref Range   Glucose-Capillary 160 (H) 65 - 99 mg/dL   Comment 1 Notify RN   Glucose, capillary     Status: Abnormal   Collection Time: 04/06/2017 10:46 PM  Result Value Ref Range   Glucose-Capillary 149 (H) 65 - 99 mg/dL   Comment 1 Notify RN   Glucose, capillary     Status: Abnormal   Collection Time: 04/19/2017 11:52 PM  Result Value Ref Range   Glucose-Capillary 225 (H) 65 - 99 mg/dL   Comment 1 Notify RN   Basic metabolic panel     Status: Abnormal   Collection Time: 04/23/17 12:25 AM  Result Value Ref Range   Sodium 135 135 - 145 mmol/L   Potassium 2.6 (LL) 3.5 - 5.1 mmol/L    Comment: CRITICAL RESULT CALLED TO, READ BACK BY AND VERIFIED WITH: HEARN,J @0152  BY MATTHEWS B 11.20.18 DELTA CHECK NOTED    Chloride 112 (H) 101 - 111 mmol/L   CO2 19 (L) 22 - 32  mmol/L   Glucose, Bld 764 (HH) 65 - 99 mg/dL    Comment: CRITICAL RESULT CALLED TO, READ BACK BY AND VERIFIED WITH: HEARN,J @ 0152 BY MATTHEWS, B 11.20.18    BUN 18 6 - 20 mg/dL   Creatinine, Ser 2.91 (H) 0.61 - 1.24 mg/dL   Calcium 5.6 (LL) 8.9 - 10.3 mg/dL    Comment: DELTA CHECK NOTED CRITICAL RESULT CALLED TO, READ BACK BY AND VERIFIED WITH: HEARN,J @ 0152BY MATTHEWS, B 11.20.18     GFR calc non Af Amer 24 (L) >60 mL/min   GFR calc Af Amer 28 (L) >60 mL/min    Comment: (NOTE) The eGFR has been calculated using the CKD EPI equation. This calculation has not been validated in all clinical situations. eGFR's persistently <60 mL/min signify possible Chronic Kidney Disease.    Anion gap 4 (L) 5 - 15  Glucose, capillary     Status: Abnormal   Collection Time: 04/23/17 12:42 AM  Result Value Ref Range   Glucose-Capillary 164 (H) 65 - 99 mg/dL   Comment 1 Notify RN   Blood gas, arterial     Status: Abnormal   Collection Time: 04/23/17  1:20 AM  Result Value Ref Range   FIO2 100.00    Delivery systems VENTILATOR    Mode PRESSURE REGULATED VOLUME CONTROL    VT 550 mL    LHR 14 resp/min   Peep/cpap 10.0 cm H20   pH, Arterial 7.218 (L) 7.350 - 7.450   pCO2 arterial 58.0 (H) 32.0 - 48.0 mmHg   pO2, Arterial 96.0 83.0 - 108.0 mmHg   Bicarbonate 20.0 20.0 - 28.0 mmol/L   Acid-base deficit 4.0 (H) 0.0 - 2.0 mmol/L   O2 Saturation 95.3 %   Patient temperature 38.0    Collection site LEFT RADIAL    Drawn by [id number]    Sample type ARTERIAL    Allens test (pass/fail) PASS PASS  Glucose, capillary     Status: Abnormal   Collection Time: 04/23/17  1:40 AM  Result Value Ref Range   Glucose-Capillary 141 (H) 65 - 99 mg/dL   Comment 1 Notify RN   Basic metabolic panel     Status: Abnormal   Collection Time: 04/23/17  2:01 AM  Result Value Ref Range   Sodium 146 (H) 135 - 145 mmol/L    Comment: DELTA CHECK NOTED   Potassium 4.3 3.5 - 5.1 mmol/L    Comment: DELTA CHECK NOTED   Chloride 116 (H) 101 - 111 mmol/L   CO2 25 22 - 32 mmol/L   Glucose, Bld 145 (H) 65 - 99 mg/dL   BUN 24 (H) 6 - 20 mg/dL   Creatinine, Ser 3.87 (H) 0.61 - 1.24 mg/dL   Calcium 8.0 (L) 8.9 - 10.3 mg/dL    Comment: DELTA CHECK NOTED   GFR calc non Af Amer 17 (L) >60 mL/min   GFR calc Af Amer 20 (L) >60 mL/min    Comment: (NOTE) The eGFR has been calculated using the CKD EPI equation. This calculation has not been validated in all clinical situations. eGFR's persistently <60 mL/min signify possible Chronic Kidney Disease.    Anion gap 5 5 - 15  CBC     Status: Abnormal   Collection Time: 04/23/17  2:01 AM  Result Value Ref Range   WBC 18.6 (H) 4.0 - 10.5 K/uL   RBC 4.77 4.22 - 5.81 MIL/uL   Hemoglobin 13.4 13.0 - 17.0 g/dL   HCT 40.9 39.0 -  52.0 %   MCV 85.7 78.0 - 100.0 fL   MCH 28.1 26.0 - 34.0 pg   MCHC 32.8 30.0 - 36.0 g/dL   RDW 14.5 11.5 - 15.5 %   Platelets 210 150 - 400 K/uL  Glucose, capillary     Status: Abnormal   Collection Time: 04/23/17  2:46 AM  Result Value Ref Range   Glucose-Capillary 143 (H) 65 - 99 mg/dL   Comment 1 Notify RN   Glucose, capillary      Status: Abnormal   Collection Time: 04/23/17  3:38 AM  Result Value Ref Range   Glucose-Capillary 157 (H) 65 - 99 mg/dL   Comment 1 Notify RN   Glucose, capillary     Status: Abnormal   Collection Time: 04/23/17  4:49 AM  Result Value Ref Range   Glucose-Capillary 148 (H) 65 - 99 mg/dL  Glucose, capillary     Status: Abnormal   Collection Time: 04/23/17  5:46 AM  Result Value Ref Range   Glucose-Capillary 153 (H) 65 - 99 mg/dL   Comment 1 Notify RN   Basic metabolic panel     Status: Abnormal   Collection Time: 04/23/17  6:23 AM  Result Value Ref Range   Sodium 141 135 - 145 mmol/L   Potassium 5.5 (H) 3.5 - 5.1 mmol/L    Comment: DELTA CHECK NOTED   Chloride 110 101 - 111 mmol/L   CO2 24 22 - 32 mmol/L   Glucose, Bld 157 (H) 65 - 99 mg/dL   BUN 26 (H) 6 - 20 mg/dL   Creatinine, Ser 4.66 (H) 0.61 - 1.24 mg/dL   Calcium 7.5 (L) 8.9 - 10.3 mg/dL   GFR calc non Af Amer 14 (L) >60 mL/min   GFR calc Af Amer 16 (L) >60 mL/min    Comment: (NOTE) The eGFR has been calculated using the CKD EPI equation. This calculation has not been validated in all clinical situations. eGFR's persistently <60 mL/min signify possible Chronic Kidney Disease.    Anion gap 7 5 - 15  Glucose, capillary     Status: Abnormal   Collection Time: 04/23/17  6:33 AM  Result Value Ref Range   Glucose-Capillary 220 (H) 65 - 99 mg/dL   Comment 1 Notify RN   Glucose, capillary     Status: Abnormal   Collection Time: 04/23/17  7:39 AM  Result Value Ref Range   Glucose-Capillary 161 (H) 65 - 99 mg/dL  Glucose, capillary     Status: Abnormal   Collection Time: 04/23/17  8:48 AM  Result Value Ref Range   Glucose-Capillary 148 (H) 65 - 99 mg/dL  Glucose, capillary     Status: Abnormal   Collection Time: 04/23/17  9:51 AM  Result Value Ref Range   Glucose-Capillary 154 (H) 65 - 99 mg/dL  Glucose, capillary     Status: Abnormal   Collection Time: 04/23/17 10:48 AM  Result Value Ref Range   Glucose-Capillary  191 (H) 65 - 99 mg/dL  Glucose, capillary     Status: Abnormal   Collection Time: 04/23/17 11:35 AM  Result Value Ref Range   Glucose-Capillary 124 (H) 65 - 99 mg/dL  Glucose, capillary     Status: Abnormal   Collection Time: 04/23/17 12:19 PM  Result Value Ref Range   Glucose-Capillary 157 (H) 65 - 99 mg/dL  Glucose, capillary     Status: Abnormal   Collection Time: 04/23/17  1:40 PM  Result Value Ref Range   Glucose-Capillary  173 (H) 65 - 99 mg/dL  I-STAT 3, arterial blood gas (G3+)     Status: Abnormal   Collection Time: 04/23/17  2:37 PM  Result Value Ref Range   pH, Arterial 7.141 (LL) 7.350 - 7.450   pCO2 arterial 66.2 (HH) 32.0 - 48.0 mmHg   pO2, Arterial 94.0 83.0 - 108.0 mmHg   Bicarbonate 22.4 20.0 - 28.0 mmol/L   TCO2 24 22 - 32 mmol/L   O2 Saturation 94.0 %   Acid-base deficit 7.0 (H) 0.0 - 2.0 mmol/L   Patient temperature 99.5 F    Collection site RADIAL, ALLEN'S TEST ACCEPTABLE    Drawn by Operator    Sample type ARTERIAL    Comment NOTIFIED PHYSICIAN   Glucose, capillary     Status: Abnormal   Collection Time: 04/23/17  2:48 PM  Result Value Ref Range   Glucose-Capillary 198 (H) 65 - 99 mg/dL  Glucose, capillary     Status: Abnormal   Collection Time: 04/23/17  3:41 PM  Result Value Ref Range   Glucose-Capillary 217 (H) 65 - 99 mg/dL   Comment 1 Notify RN    Comment 2 Document in Chart    Ct Head Wo Contrast  Addendum Date: 04/23/2017   ADDENDUM REPORT: 04/23/2017 16:35 ADDENDUM: Critical Value/emergent results were called by telephone at the time of interpretation on 04/23/2017 at 4:34 pm to Dr. Marolyn Hammock, who verbally acknowledged these results. Electronically Signed   By: Titus Dubin M.D.   On: 04/23/2017 16:35   Result Date: 04/23/2017 CLINICAL DATA:  Altered mental status. EXAM: CT HEAD WITHOUT CONTRAST TECHNIQUE: Contiguous axial images were obtained from the base of the skull through the vertex without intravenous contrast. COMPARISON:  CT head  from yesterday. FINDINGS: Brain: New hypodensity and loss of the gray-white matter differentiation in the right insula and operculum. New focal hypodensity within the left internal capsule. No hemorrhage, hydrocephalus, extra-axial collection, or mass lesion/mass effect. Vascular: No hyperdense vessel or unexpected calcification. Skull: Normal. Negative for fracture or focal lesion. Sinuses/Orbits: Unchanged air-fluid levels within the bilateral paranasal sinuses. The mastoid air cells are clear. Other: Partially visualized endotracheal and nasogastric tubes. IMPRESSION: 1. New hypodensity and loss of gray-white matter differentiation in the right insula and operculum, concerning for acute infarct. Additional focal hypodensity within the left internal capsule is also suspicious for infarct. These could be related to hypoperfusion given the clinical history of cardiac arrest, however they are not the typical watershed infarct zones. 2. Unchanged acute pansinusitis. Radiology assistant personnel have been notified to put me in telephone contact with the referring physician or the referring physician's clinical representative in order to discuss these findings. Once this communication is established I will issue an addendum to this report for documentation purposes. Electronically Signed: By: Titus Dubin M.D. On: 04/23/2017 16:29   Ct Head Wo Contrast  Result Date: 04/24/2017 CLINICAL DATA:  Seizure. Head trauma, minor, GCS>=13, high clinical risk, initial exam EXAM: CT HEAD WITHOUT CONTRAST TECHNIQUE: Contiguous axial images were obtained from the base of the skull through the vertex without intravenous contrast. COMPARISON:  10/28/2014 FINDINGS: Brain: No evidence of acute infarction, hemorrhage, hydrocephalus, extra-axial collection, or mass lesion/mass effect. Stable mild to moderate diffuse cerebral atrophy. Vascular:  No hyperdense vessel or other acute findings. Skull: No evidence of fracture or other  significant bone abnormality. Sinuses/Orbits: Air-fluid levels seen throughout the visualized paranasal sinuses, consistent with acute sinusitis. Other: None. IMPRESSION: No acute intracranial abnormality.  Stable cerebral atrophy.  Acute pansinusitis. Electronically Signed   By: Earle Gell M.D.   On: 04/08/2017 11:13   Dg Chest Port 1 View  Result Date: 04/23/2017 CLINICAL DATA:  Hypoxia EXAM: PORTABLE CHEST 1 VIEW COMPARISON:  April 22, 2017 FINDINGS: Endotracheal tube tip is 4.2 cm above the carina. Central catheter tip is in the superior vena cava. Nasogastric tube tip and side port are below the diaphragm. No pneumothorax. There has been considerable clearing of airspace consolidation from the left lung compared to 1 day prior. There is patchy alveolar opacity throughout both lungs currently. There is no new opacity. Heart size and pulmonary vascularity are normal. No adenopathy. No bone lesions. IMPRESSION: One shadow positions as described without pneumothorax. Multifocal airspace opacity, likely pneumonia, with considerably less consolidation on the left compared to 1 day prior. There may be a degree of superimposed noncardiogenic edema and/or ARDS. More than one of these entities may exist concurrently. Electronically Signed   By: Lowella Grip III M.D.   On: 04/23/2017 15:02   Dg Chest Portable 1 View  Result Date: 04/06/2017 CLINICAL DATA:  Seizure-like activity. Blood around the mouth. Combative. The patient underwent CPR and is now intubated. EXAM: PORTABLE CHEST 1 VIEW COMPARISON:  Chest x-ray of April 19, 2004 FINDINGS: The lungs are reasonably well inflated. The interstitial markings are increased. There is no pneumothorax or pleural effusion. The heart is normal in size. The pulmonary vascularity is indistinct. The endotracheal tube lies 4 cm above the carina. The esophagogastric tube tip and proximal port lie below the GE junction. The observed bony thorax is unremarkable.  IMPRESSION: Borderline hypoinflation. Mild central pulmonary vascular congestion. No alveolar pneumonia. The endotracheal tube and esophagogastric tube are in reasonable position. Electronically Signed   By: David  Martinique M.D.   On: 04/09/2017 10:00   Dg Chest Port 1v Same Day  Result Date: 04/23/2017 CLINICAL DATA:  Initial evaluation for acute respiratory failure. EXAM: PORTABLE CHEST 1 VIEW COMPARISON:  Prior radiograph from earlier the same day. FINDINGS: Endotracheal tube in place with tip positioned 4.2 cm above the carina. Enteric tube courses in the the abdomen. Right-sided PICC catheter in place with tip overlying the distal SVC. Cardiac and mediastinal silhouettes are partially obscured, but grossly stable. Interval development of extensive multifocal bilateral airspace opacities, left greater than right, which may reflect progressive infiltrates and/ or asymmetric edema. Suspected left pleural effusion. No pneumothorax. No acute osseous abnormality. IMPRESSION: 1. Interval development of extensive bilateral airspace opacities, left worse than right. Given the fairly rapid development of these findings, asymmetric pulmonary edema is favored. Rapidly progressive infiltrates and/or aspiration could also be considered. 2. Suspected left pleural effusion. Electronically Signed   By: Jeannine Boga M.D.   On: 04/23/2017 00:09    Assessment: 46 y.o. male initially presenting to OSH with status epilepticus in the setting of hyperosmolar hyperglycemic state  1. I agree with Dr. Freddie Apley assessment that this may be a case of provoked seizures due to hyperglycemia. I agree that given the severe complications, the patient should be maintained on long-term antiseizure medications. 2. Intial CT head was negative for acute abnormality. Follow up CT head shows new hypodensity and loss of gray-white matter differentiation in the right insula and operculum, concerning for acute infarct. Additional focal  hypodensity within the left internal capsule is also suspicious for infarct. These could be related to hypoperfusion given the clinical history of cardiac arrest, however they are not the typical watershed infarct zones. Early/evolving radiologic manifestations of  diffuse hypoxic brain injury is also on the DDx.  3. Based upon his examination, which reveals depressed brainstem reflexes and no motor responses to any stimuli, diffuse anoxic brain injury from the brief code at the outside ER is a consideration. Slow metabolism of sedating meds, which have been held for the past 4 hours, is also possible. Coma with depressed brainstem reflexes would not generally be expected in a postictal state, even following status epilepticus.  4. Preliminary assessment of bedside EEG reveals diffuse low voltage tracings without electrographic seizures  Plan: 1. MRI brain when EEG is complete 2. Continue Keppra 3. Frequent neuro checks 4. Seizure precautions 5. Await official EEG report 6. Discussed the possibility of a poor Neurological prognosis with the patient's family  35 minutes spent in the acute Neurological evaluation and management of this critically ill patient  @Electronically  signed: Dr. Kerney Elbe  04/23/2017, 4:43 PM

## 2017-04-23 NOTE — Progress Notes (Signed)
Dr Robb Matarrtiz paged regarding decreased urine output. No new orders at this time

## 2017-04-23 NOTE — Progress Notes (Signed)
Pharmacy Antibiotic Note  Ricardo HighlandJohn W Rogers is a 46 y.o. male admitted on 03-29-17 with seizures - with concern for PNA. Originally started on Unasyn however still febrile with this so plans are now to broaden to Zosyn. Pharmacy consulted to dose.   The patient is noted to be in AKI - SCr up to 4.66 from 1.54 on admit on 11/19, CrCl<20 ml/min.   Plan: 1. Zosyn 2.25g IV every 8 hours 2. Will continue to follow renal function, culture results, LOT, and antibiotic de-escalation plans   Height: 5\' 10"  (177.8 cm) Weight: 197 lb 8.5 oz (89.6 kg) IBW/kg (Calculated) : 73  Temp (24hrs), Avg:100.4 F (38 C), Min:99.5 F (37.5 C), Max:101.3 F (38.5 C)  Recent Labs  Lab 11-06-16 0908 11-06-16 0911 11-06-16 1242 11-06-16 1859 04/23/17 0025 04/23/17 0201 04/23/17 0623  WBC 18.9*  --   --   --   --  18.6*  --   CREATININE 1.54* 1.30*  --   --  2.91* 3.87* 4.66*  LATICACIDVEN  --   --  2.0* 5.0*  --   --   --     Estimated Creatinine Clearance: 21.8 mL/min (A) (by C-G formula based on SCr of 4.66 mg/dL (H)).    No Known Allergies  Antimicrobials this admission: Unasyn 11/20 x 1 Zosyn 11/20 >>  Dose adjustments this admission: n/a  Microbiology results: 11/19 MRSA PCR >> neg 11/19 RCx >>  Thank you for allowing pharmacy to be a part of this patient's care.  Georgina PillionElizabeth Oluwadarasimi Redmon, PharmD, BCPS Clinical Pharmacist Pager: [phone] If after 3:30p, please call main pharmacy at: [phone] 04/23/2017 3:32 PM

## 2017-04-23 NOTE — Progress Notes (Signed)
[facility] called and asked that we call them if pt is declared brain dead or if care is being withdrawn.   Wells Guileseep Marisabel Macpherson, MD.   Board Certified in Internal Medicine, Pulmonary Medicine, Critical Care Medicine, and Sleep Medicine.  [facility] Office Number: [phone] Pager: [phone]  Santiago Gladavid Kasa, M.D.  Billy Fischeravid Simonds, M.D

## 2017-04-23 NOTE — Progress Notes (Addendum)
Report called to Nehemiah SettleBrooke, RN on 4N.  [facility] transport around 1200.

## 2017-04-23 NOTE — Progress Notes (Signed)
PHARMACY - PHYSICIAN COMMUNICATION CRITICAL VALUE ALERT - BLOOD CULTURE IDENTIFICATION (BCID)  Results for orders placed or performed during the hospital encounter of 05/03/2017  Blood Culture ID Panel (Reflexed) (Collected: 04/17/2017  6:58 PM)  Result Value Ref Range   Enterococcus species NOT DETECTED NOT DETECTED   Listeria monocytogenes NOT DETECTED NOT DETECTED   Staphylococcus species DETECTED (A) NOT DETECTED   Staphylococcus aureus NOT DETECTED NOT DETECTED   Methicillin resistance NOT DETECTED NOT DETECTED   Streptococcus species NOT DETECTED NOT DETECTED   Streptococcus agalactiae NOT DETECTED NOT DETECTED   Streptococcus pneumoniae NOT DETECTED NOT DETECTED   Streptococcus pyogenes NOT DETECTED NOT DETECTED   Acinetobacter baumannii NOT DETECTED NOT DETECTED   Enterobacteriaceae species NOT DETECTED NOT DETECTED   Enterobacter cloacae complex NOT DETECTED NOT DETECTED   Escherichia coli NOT DETECTED NOT DETECTED   Klebsiella oxytoca NOT DETECTED NOT DETECTED   Klebsiella pneumoniae NOT DETECTED NOT DETECTED   Proteus species NOT DETECTED NOT DETECTED   Serratia marcescens NOT DETECTED NOT DETECTED   Haemophilus influenzae NOT DETECTED NOT DETECTED   Neisseria meningitidis NOT DETECTED NOT DETECTED   Pseudomonas aeruginosa NOT DETECTED NOT DETECTED   Candida albicans NOT DETECTED NOT DETECTED   Candida glabrata NOT DETECTED NOT DETECTED   Candida krusei NOT DETECTED NOT DETECTED   Candida parapsilosis NOT DETECTED NOT DETECTED   Candida tropicalis NOT DETECTED NOT DETECTED    Name of physician (or Provider) Contacted: Ramachandran  Changes to prescribed antibiotics required: Likely contaminant (CoNS in 1/4 bottles), on Zosyn, no changes needed.  Vernard GamblesVeronda Amandeep Hogston, PharmD, BCPS  04/23/2017  11:36 PM

## 2017-04-23 NOTE — Progress Notes (Signed)
Pt transported on vent to CT and returned to 4N15. Pt's vitals remained stable throughout.

## 2017-04-24 ENCOUNTER — Inpatient Hospital Stay (HOSPITAL_COMMUNITY): Payer: BLUE CROSS/BLUE SHIELD

## 2017-04-24 DIAGNOSIS — I361 Nonrheumatic tricuspid (valve) insufficiency: Secondary | ICD-10-CM

## 2017-04-24 LAB — GLUCOSE, CAPILLARY
GLUCOSE-CAPILLARY: 137 mg/dL — AB (ref 65–99)
GLUCOSE-CAPILLARY: 142 mg/dL — AB (ref 65–99)
GLUCOSE-CAPILLARY: 143 mg/dL — AB (ref 65–99)
GLUCOSE-CAPILLARY: 218 mg/dL — AB (ref 65–99)
GLUCOSE-CAPILLARY: 233 mg/dL — AB (ref 65–99)
Glucose-Capillary: 139 mg/dL — ABNORMAL HIGH (ref 65–99)
Glucose-Capillary: 174 mg/dL — ABNORMAL HIGH (ref 65–99)
Glucose-Capillary: 178 mg/dL — ABNORMAL HIGH (ref 65–99)
Glucose-Capillary: 260 mg/dL — ABNORMAL HIGH (ref 65–99)

## 2017-04-24 LAB — BLOOD GAS, ARTERIAL
ACID-BASE DEFICIT: 10.1 mmol/L — AB (ref 0.0–2.0)
Bicarbonate: 15.6 mmol/L — ABNORMAL LOW (ref 20.0–28.0)
DRAWN BY: 257081
FIO2: 60
LHR: 20 {breaths}/min
O2 SAT: 98 %
PATIENT TEMPERATURE: 100.1
PEEP: 10 cmH2O
PO2 ART: 136 mmHg — AB (ref 83.0–108.0)
VT: 550 mL
pCO2 arterial: 38 mmHg (ref 32.0–48.0)
pH, Arterial: 7.244 — ABNORMAL LOW (ref 7.350–7.450)

## 2017-04-24 LAB — CBC
HCT: 34.7 % — ABNORMAL LOW (ref 39.0–52.0)
Hemoglobin: 11.8 g/dL — ABNORMAL LOW (ref 13.0–17.0)
MCH: 28.3 pg (ref 26.0–34.0)
MCHC: 34 g/dL (ref 30.0–36.0)
MCV: 83.2 fL (ref 78.0–100.0)
PLATELETS: 192 10*3/uL (ref 150–400)
RBC: 4.17 MIL/uL — ABNORMAL LOW (ref 4.22–5.81)
RDW: 15.5 % (ref 11.5–15.5)
WBC: 17.1 10*3/uL — AB (ref 4.0–10.5)

## 2017-04-24 LAB — ECHOCARDIOGRAM COMPLETE
Height: 70 in
WEIGHTICAEL: 4208.14 [oz_av]

## 2017-04-24 LAB — COMPREHENSIVE METABOLIC PANEL
ALBUMIN: 2.2 g/dL — AB (ref 3.5–5.0)
ALT: 294 U/L — ABNORMAL HIGH (ref 17–63)
AST: 580 U/L — AB (ref 15–41)
Alkaline Phosphatase: 67 U/L (ref 38–126)
Anion gap: 9 (ref 5–15)
BUN: 45 mg/dL — AB (ref 6–20)
CHLORIDE: 111 mmol/L (ref 101–111)
CO2: 20 mmol/L — AB (ref 22–32)
Calcium: 7.1 mg/dL — ABNORMAL LOW (ref 8.9–10.3)
Creatinine, Ser: 7.71 mg/dL — ABNORMAL HIGH (ref 0.61–1.24)
GFR calc Af Amer: 9 mL/min — ABNORMAL LOW (ref >60)
GFR calc non Af Amer: 7 mL/min — ABNORMAL LOW (ref >60)
GLUCOSE: 142 mg/dL — AB (ref 65–99)
POTASSIUM: 5.5 mmol/L — AB (ref 3.5–5.1)
SODIUM: 140 mmol/L (ref 135–145)
Total Bilirubin: 1 mg/dL (ref 0.3–1.2)
Total Protein: 5.9 g/dL — ABNORMAL LOW (ref 6.5–8.1)

## 2017-04-24 LAB — CULTURE, RESPIRATORY W GRAM STAIN

## 2017-04-24 LAB — PROTIME-INR
INR: 1.39
Prothrombin Time: 16.9 seconds — ABNORMAL HIGH (ref 11.4–15.2)

## 2017-04-24 LAB — APTT: APTT: 32 s (ref 24–36)

## 2017-04-24 LAB — PROCALCITONIN

## 2017-04-24 MED ORDER — METHYLPREDNISOLONE SODIUM SUCC 40 MG IJ SOLR: 40.0000 mg | Freq: Every day | INTRAMUSCULAR | Status: DC

## 2017-04-24 MED ORDER — SODIUM CHLORIDE 0.9 % IV SOLN
INTRAVENOUS | Status: DC
  Administered 2017-04-24: 1.8 [IU]/h via INTRAVENOUS
  Filled 2017-04-24 (×2): qty 1

## 2017-04-24 MED ORDER — "THROMBI-PAD 3""X3"" EX PADS"
1.0000 | MEDICATED_PAD | Freq: Once | CUTANEOUS | Status: AC
  Administered 2017-04-24: 1 via TOPICAL
  Filled 2017-04-24: qty 1

## 2017-04-24 MED ORDER — PRISMASOL BGK 4/2.5 32-4-2.5 MEQ/L IV SOLN
INTRAVENOUS | Status: DC
  Administered 2017-04-24 – 2017-04-27 (×5): via INTRAVENOUS_CENTRAL
  Filled 2017-04-24 (×10): qty 5000

## 2017-04-24 MED ORDER — PIPERACILLIN-TAZOBACTAM 3.375 G IVPB
3.3750 g | Freq: Four times a day (QID) | INTRAVENOUS | Status: DC
  Administered 2017-04-24: 3.375 g via INTRAVENOUS
  Filled 2017-04-24 (×3): qty 50

## 2017-04-24 MED ORDER — PIPERACILLIN-TAZOBACTAM 3.375 G IVPB
3.3750 g | Freq: Four times a day (QID) | INTRAVENOUS | Status: DC
  Administered 2017-04-24 – 2017-04-26 (×6): 3.375 g via INTRAVENOUS
  Filled 2017-04-24 (×7): qty 50

## 2017-04-24 MED ORDER — MAGNESIUM SULFATE 2 GM/50ML IV SOLN
2.0000 g | Freq: Once | INTRAVENOUS | Status: AC
  Administered 2017-04-24: 2 g via INTRAVENOUS
  Filled 2017-04-24: qty 50

## 2017-04-24 MED ORDER — PRISMASOL BGK 4/2.5 32-4-2.5 MEQ/L IV SOLN
INTRAVENOUS | Status: DC
  Administered 2017-04-24 – 2017-04-28 (×10): via INTRAVENOUS_CENTRAL
  Filled 2017-04-24 (×12): qty 5000

## 2017-04-24 MED ORDER — HEPARIN SODIUM (PORCINE) 1000 UNIT/ML DIALYSIS: 1000.0000 [IU] | INTRAMUSCULAR | Status: DC | PRN

## 2017-04-24 MED ORDER — FUROSEMIDE 10 MG/ML IJ SOLN
160.0000 mg | Freq: Two times a day (BID) | INTRAVENOUS | Status: DC
  Administered 2017-04-24: 160 mg via INTRAVENOUS
  Filled 2017-04-24 (×2): qty 16

## 2017-04-24 MED ORDER — INSULIN GLARGINE 100 UNIT/ML ~~LOC~~ SOLN
30.0000 [IU] | SUBCUTANEOUS | Status: DC
  Administered 2017-04-24: 30 [IU] via SUBCUTANEOUS
  Filled 2017-04-24: qty 0.3

## 2017-04-24 MED ORDER — FENTANYL CITRATE (PF) 100 MCG/2ML IJ SOLN
100.0000 ug | INTRAMUSCULAR | Status: DC | PRN
  Administered 2017-04-24 – 2017-04-28 (×20): 100 ug via INTRAVENOUS
  Filled 2017-04-24 (×20): qty 2

## 2017-04-24 MED ORDER — LABETALOL HCL 5 MG/ML IV SOLN
10.0000 mg | INTRAVENOUS | Status: DC | PRN
  Administered 2017-04-24 – 2017-04-26 (×4): 20 mg via INTRAVENOUS
  Filled 2017-04-24 (×3): qty 4

## 2017-04-24 MED ORDER — PRISMASOL BGK 4/2.5 32-4-2.5 MEQ/L IV SOLN
INTRAVENOUS | Status: DC
  Administered 2017-04-24 – 2017-04-28 (×22): via INTRAVENOUS_CENTRAL
  Filled 2017-04-24 (×31): qty 5000

## 2017-04-24 MED ORDER — SODIUM CHLORIDE 0.9 % FOR CRRT
INTRAVENOUS_CENTRAL | Status: DC | PRN
  Administered 2017-04-25 – 2017-04-27 (×4): via INTRAVENOUS_CENTRAL

## 2017-04-24 NOTE — Progress Notes (Signed)
  Echocardiogram 2D Echocardiogram has been performed.  Celene SkeenVijay  Ivyana Locey 04/24/2017, 3:05 PM

## 2017-04-24 NOTE — Progress Notes (Signed)
Pharmacy called at 1452, reported that fluids are enroute

## 2017-04-24 NOTE — Consult Note (Addendum)
[facility] Consultation Note Requesting Physician:  Dr. Titus Mould Primary Nephrologist: N/A Reason for Consult:  Oliguric AKI on CKD  HPI: The patient is a 46 y.o. year-old AAM PMH DM, HTN, medical noncompliance. Presented to [facility] following seizure at home, then in [facility] ED recurrent w/bradycarda into 30's requiring brief CPR, intubation for airway protection with possible aspiration and now ARDS picture. Marked hyperglycemia. Shock, progressive hypoxemia, fever and brief need for pressors. Transferred here to [facility] service for management. Baseline creatinine most recently 1.4-1.5, progressive rise to 7.7 with oliguria. Asked to see.   Pt neurologically without further sz but non-responsive off all sedation. Now question of acute stroke vs anoxic brain injury, EEG neg for status, and MRI pending for neuro assessment. Lasix 160 challenge planned.   Creatinine, Ser  Date/Time Value Ref Range Status  04/24/2017 04:32 AM 7.71 (H) 0.61 - 1.24 mg/dL Final  04/23/2017 11:15 PM 6.96 (H) 0.61 - 1.24 mg/dL Final  04/23/2017 03:23 PM 5.30 (H) 0.61 - 1.24 mg/dL Final  04/23/2017 06:23 AM 4.66 (H) 0.61 - 1.24 mg/dL Final  04/23/2017 02:01 AM 3.87 (H) 0.61 - 1.24 mg/dL Final  04/23/2017 12:25 AM 2.91 (H) 0.61 - 1.24 mg/dL Final  04/23/2017 09:11 AM 1.30 (H) 0.61 - 1.24 mg/dL Final  04/14/2017 09:08 AM 1.54 (H) 0.61 - 1.24 mg/dL Final  10/28/2014 07:22 AM 1.46 (H) 0.61 - 1.24 mg/dL Final  07/03/2014 09:25 PM 1.12 0.50 - 1.35 mg/dL Final  07/10/2013 01:36 PM 1.00 0.50 - 1.35 mg/dL Final  08/22/2012 05:04 AM 0.91 0.50 - 1.35 mg/dL Final  08/20/2012 10:28 PM 0.91 0.50 - 1.35 mg/dL Final  08/20/2012 08:24 PM 0.95 0.50 - 1.35 mg/dL Final  08/20/2012 06:41 PM 1.05 0.50 - 1.35 mg/dL Final  08/20/2012 04:25 PM 0.93 0.50 - 1.35 mg/dL Final  08/20/2012 12:04 PM 1.04 0.50 - 1.35 mg/dL Final    Past Medical History:  Diagnosis Date  . Asthma   . Diabetes mellitus without complication ([place])   . HTN (hypertension)   .  Seizures ([place])     Past Surgical History: History reviewed. No pertinent surgical history.  Family History  Problem Relation Age of Onset  . Cancer Other   . Diabetes Other   . Hypertension Other    Social History:  reports that  has never smoked. he has never used smokeless tobacco. He reports that he does not drink alcohol or use drugs.  Allergies: No Known Allergies   Prior to Admission medications   Medication Sig Start Date End Date Taking? Authorizing Provider  ADVAIR DISKUS 250-50 MCG/DOSE AEPB Inhale 1 puff into the lungs 2 (two) times daily as needed.  11/03/13  Yes [provider]  atorvastatin (LIPITOR) 20 MG tablet Take 20 mg by mouth daily at 6 PM.   Yes [provider]  glipiZIDE (GLUCOTROL) 10 MG tablet Take 1 tablet (10 mg total) by mouth 2 (two) times daily before a meal. Patient taking differently: Take 10 mg by mouth daily before breakfast.  02/07/77  Yes Delora Fuel, MD  lisinopril-hydrochlorothiazide (PRINZIDE,ZESTORETIC) 20-12.5 MG per tablet Take 1 tablet by mouth 2 (two) times daily. 02/05/80  Yes Delora Fuel, MD  metFORMIN (GLUCOPHAGE) 1000 MG tablet Take 1 tablet (1,000 mg total) by mouth 2 (two) times daily with a meal. 0/1/75  Yes Delora Fuel, MD  cephALEXin (KEFLEX) 500 MG capsule Take 1 capsule (500 mg total) by mouth 4 (four) times daily. Patient not taking: Reported on 04/23/2017 06/08/15   Veryl Speak,  MD  HYDROcodone-acetaminophen (NORCO) 5-325 MG tablet Take 1-2 tablets by mouth every 6 (six) hours as needed. Patient not taking: Reported on 04/23/2017 06/08/15   Veryl Speak, MD   Inpatient medications: . atorvastatin  40 mg Per Tube q1800  . chlorhexidine gluconate (MEDLINE KIT)  15 mL Mouth Rinse BID  . heparin subcutaneous  5,000 Units Subcutaneous Q12H  . insulin aspart  2-6 Units Subcutaneous Q4H  . insulin glargine  30 Units Subcutaneous Q24H  . mouth rinse  15 mL Mouth Rinse 10 times per day  . [START ON 04/25/2017]  methylPREDNISolone (SOLU-MEDROL) injection  40 mg Intravenous Daily  . pantoprazole (PROTONIX) IV  40 mg Intravenous Q24H   . furosemide    . levETIRAcetam Stopped (04/23/17 2201)  . piperacillin-tazobactam (ZOSYN)  IV 2.25 g (04/24/17 0804)   Review of Systems unobtainable    Physical Exam:  Blood pressure 139/79, pulse (!) 123, temperature 100.1 F (37.8 C), temperature source Axillary, resp. rate (!) 30, height 5' 10"  (1.778 m), weight 119.3 kg (263 lb 0.1 oz), SpO2 98 %.  Gen: intubated, unresponsive CVP 16 Lines/tubes: ETT, foley, R PICC, NGT Skin: no rash, cyanosis Neck: no JVD, no bruits or LAN Chest: Coarse BS, clear Heart: Tachy 120's regular. S4 S1 S2 Abdomen: soft, mild distension,  faint BS Ext: No LE edema Neuro: Unresponsive to painful stimuli (to me) Dialysis Access: None at present  Recent Labs  Lab 04/19/2017 0908 04/15/2017 0911 04/23/17 0025 04/23/17 0201 04/23/17 0623 04/23/17 1523 04/23/17 2315 04/24/17 0432  NA 136 138 135 146* 141 134* 139 140  K 3.1* 3.8 2.6* 4.3 5.5* 5.4* 5.0 5.5*  CL 98* 101 112* 116* 110 107 110 111  CO2 22  --  19* 25 24 18* 20* 20*  GLUCOSE 560* 590* 764* 145* 157* 697* 142* 142*  BUN 15 18 18  24* 26* 29* 39* 45*  CREATININE 1.54* 1.30* 2.91* 3.87* 4.66* 5.30* 6.96* 7.71*  CALCIUM 9.6  --  5.6* 8.0* 7.5* 6.1* 6.9* 7.1*  PHOS  --   --   --   --   --  4.5  --   --     Recent Labs  Lab 04/23/2017 0908 04/23/17 1523 04/23/17 2315 04/24/17 0432  AST 25  --  531* 580*  ALT 26  --  264* 294*  ALKPHOS 114  --  59 67  BILITOT 0.8  --  1.0 1.0  PROT 8.4*  --  5.8* 5.9*  ALBUMIN 4.0 2.1* 2.3* 2.2*    Recent Labs  Lab 04/04/2017 0908 04/05/2017 0911 04/23/17 0201 04/24/17 0432  WBC 18.9*  --  18.6* 17.1*  NEUTROABS 13.9*  --   --   --   HGB 15.5 16.3 13.4 11.8*  HCT 44.7 48.0 40.9 34.7*  MCV 80.7  --  85.7 83.2  PLT 296  --  210 192    Recent Labs  Lab 04/04/2017 0911  TROPONINI <0.03   CBG: Recent Labs  Lab  04/23/17 2352 04/24/17 0054 04/24/17 0157 04/24/17 0255 04/24/17 0736  GLUCAP 142* 139* 137* 143* 178*    Ct Head Wo Contrast  Addendum Date: 04/23/2017   ADDENDUM REPORT: 04/23/2017 16:35 ADDENDUM: Critical Value/emergent results were called by telephone at the time of interpretation on 04/23/2017 at 4:34 pm to Dr. Marolyn Hammock, who verbally acknowledged these results. Electronically Signed   By: Titus Dubin M.D.   On: 04/23/2017 16:35   Result Date: 04/23/2017 CLINICAL DATA:  Altered mental  status. EXAM: CT HEAD WITHOUT CONTRAST TECHNIQUE: Contiguous axial images were obtained from the base of the skull through the vertex without intravenous contrast. COMPARISON:  CT head from yesterday. FINDINGS: Brain: New hypodensity and loss of the gray-white matter differentiation in the right insula and operculum. New focal hypodensity within the left internal capsule. No hemorrhage, hydrocephalus, extra-axial collection, or mass lesion/mass effect. Vascular: No hyperdense vessel or unexpected calcification. Skull: Normal. Negative for fracture or focal lesion. Sinuses/Orbits: Unchanged air-fluid levels within the bilateral paranasal sinuses. The mastoid air cells are clear. Other: Partially visualized endotracheal and nasogastric tubes. IMPRESSION: 1. New hypodensity and loss of gray-white matter differentiation in the right insula and operculum, concerning for acute infarct. Additional focal hypodensity within the left internal capsule is also suspicious for infarct. These could be related to hypoperfusion given the clinical history of cardiac arrest, however they are not the typical watershed infarct zones. 2. Unchanged acute pansinusitis. Radiology assistant personnel have been notified to put me in telephone contact with the referring physician or the referring physician's clinical representative in order to discuss these findings. Once this communication is established I will issue an addendum to this  report for documentation purposes. Electronically Signed: By: Titus Dubin M.D. On: 04/23/2017 16:29   Ct Head Wo Contrast  Result Date: 05/02/2017 CLINICAL DATA:  Seizure. Head trauma, minor, GCS>=13, high clinical risk, initial exam EXAM: CT HEAD WITHOUT CONTRAST TECHNIQUE: Contiguous axial images were obtained from the base of the skull through the vertex without intravenous contrast. COMPARISON:  10/28/2014 FINDINGS: Brain: No evidence of acute infarction, hemorrhage, hydrocephalus, extra-axial collection, or mass lesion/mass effect. Stable mild to moderate diffuse cerebral atrophy. Vascular:  No hyperdense vessel or other acute findings. Skull: No evidence of fracture or other significant bone abnormality. Sinuses/Orbits: Air-fluid levels seen throughout the visualized paranasal sinuses, consistent with acute sinusitis. Other: None. IMPRESSION: No acute intracranial abnormality.  Stable cerebral atrophy. Acute pansinusitis. Electronically Signed   By: Earle Gell M.D.   On: 04/11/2017 11:13   Dg Chest Port 1 View  Result Date: 04/24/2017 CLINICAL DATA:  Intubated EXAM: PORTABLE CHEST 1 VIEW COMPARISON:  04/23/2017 FINDINGS: Support devices are stable. Multifocal airspace opacities are again noted, minimally improved compatible with multifocal pneumonia. Heart is normal size. No effusions. IMPRESSION: Slight improvement in multifocal bilateral airspace opacity/pneumonia. Electronically Signed   By: Rolm Baptise M.D.   On: 04/24/2017 09:29   Dg Chest Port 1 View  Result Date: 04/23/2017 CLINICAL DATA:  Hypoxia EXAM: PORTABLE CHEST 1 VIEW COMPARISON:  April 22, 2017 FINDINGS: Endotracheal tube tip is 4.2 cm above the carina. Central catheter tip is in the superior vena cava. Nasogastric tube tip and side port are below the diaphragm. No pneumothorax. There has been considerable clearing of airspace consolidation from the left lung compared to 1 day prior. There is patchy alveolar opacity  throughout both lungs currently. There is no new opacity. Heart size and pulmonary vascularity are normal. No adenopathy. No bone lesions. IMPRESSION: One shadow positions as described without pneumothorax. Multifocal airspace opacity, likely pneumonia, with considerably less consolidation on the left compared to 1 day prior. There may be a degree of superimposed noncardiogenic edema and/or ARDS. More than one of these entities may exist concurrently. Electronically Signed   By: Lowella Grip III M.D.   On: 04/23/2017 15:02   Dg Chest Portable 1 View  Result Date: 04/15/2017 CLINICAL DATA:  Seizure-like activity. Blood around the mouth. Combative. The patient underwent CPR and is  now intubated. EXAM: PORTABLE CHEST 1 VIEW COMPARISON:  Chest x-ray of April 19, 2004 FINDINGS: The lungs are reasonably well inflated. The interstitial markings are increased. There is no pneumothorax or pleural effusion. The heart is normal in size. The pulmonary vascularity is indistinct. The endotracheal tube lies 4 cm above the carina. The esophagogastric tube tip and proximal port lie below the GE junction. The observed bony thorax is unremarkable. IMPRESSION: Borderline hypoinflation. Mild central pulmonary vascular congestion. No alveolar pneumonia. The endotracheal tube and esophagogastric tube are in reasonable position. Electronically Signed   By: David  Martinique M.D.   On: 04/13/2017 10:00   Dg Chest Port 1v Same Day  Result Date: 04/23/2017 CLINICAL DATA:  Initial evaluation for acute respiratory failure. EXAM: PORTABLE CHEST 1 VIEW COMPARISON:  Prior radiograph from earlier the same day. FINDINGS: Endotracheal tube in place with tip positioned 4.2 cm above the carina. Enteric tube courses in the the abdomen. Right-sided PICC catheter in place with tip overlying the distal SVC. Cardiac and mediastinal silhouettes are partially obscured, but grossly stable. Interval development of extensive multifocal bilateral  airspace opacities, left greater than right, which may reflect progressive infiltrates and/ or asymmetric edema. Suspected left pleural effusion. No pneumothorax. No acute osseous abnormality. IMPRESSION: 1. Interval development of extensive bilateral airspace opacities, left worse than right. Given the fairly rapid development of these findings, asymmetric pulmonary edema is favored. Rapidly progressive infiltrates and/or aspiration could also be considered. 2. Suspected left pleural effusion. Electronically Signed   By: Jeannine Boga M.D.   On: 04/23/2017 00:09    Background: 46 y.o. year-old AAM PMH DM, HTN, medical noncompliance. Presented to [facility] following seizure at home, then in [facility] ED recurrent w/bradycarda into 30's requiring brief CPR, intubation for airway protection with possible aspiration and now ARDS picture. Marked hyperglycemia. Shock, progressive hypoxemia, fever and brief need for pressors. Shock liver. Transferred here to [facility] service for management. Baseline creatinine most recently 1.4-1.5, progressive rise to 7.7 with oliguria. Asked to see.   Assessment/Recommendations  1. AKI on CKD 3 - ischemic ATN (seizures, CPR, shock w/brief pressor requirement). Oliguric, creatinine rapidly rising. Will require CRRT. If cannot clarify neuro status/prognosis for neuro recovery in the short run, then will require temp HD cath placement and CRRT for management. MRI for later today. 2. Hyperkalemia - worsening. 3. Acute hypoxemic resp failure post sz/aspiration pneumonitis/bilat airspace opacities - per CCM. Vent support. Steroids. ATB's for PNA 4. Shock, s/p brief CPR and pressors. - not requiring pressors at present 5. Shock liver with transaminasitis 6. DM per CCM 7. Encephalopathy - assessing potential for neuro recovery from stroke vs anoxic brain injury.   Jamal Maes,  MD [facility] [phone] pager 04/24/2017, 9:49 AM   Addendum: Dr. Titus Mould met with  family. Plan is to continue support and observe for 48 hours (including CRRT) but not to perform resuscitation.  Jamal Maes, MD [facility] [phone] Pager 04/24/2017, 3:18 PM

## 2017-04-24 NOTE — Progress Notes (Signed)
Blood sugar is 144 at this time. Insulin gtt at 2.5 but now stopped. Lantus was given earlier, see MAR. Blood sugar will be rechecked at 8am.

## 2017-04-24 NOTE — Progress Notes (Addendum)
Subjective: Official EEG report resulted. There is diffuse low voltage slowing without electrographic seizures.   Objective: Current vital signs: BP (!) 138/95   Pulse (!) 117   Temp 99.4 F (37.4 C) (Axillary)   Resp (!) 25   Ht 5' 10"  (1.778 m)   Wt 119.3 kg (263 lb 0.1 oz)   SpO2 98%   BMI 37.74 kg/m  Vital signs in last 24 hours: Temp:  [99.1 F (37.3 C)-100.3 F (37.9 C)] 99.4 F (37.4 C) (11/21 0400) Pulse Rate:  [113-129] 117 (11/21 0700) Resp:  [13-26] 25 (11/21 0700) BP: (84-156)/(52-99) 138/95 (11/21 0700) SpO2:  [90 %-99 %] 98 % (11/21 0700) FiO2 (%):  [70 %-90 %] 70 % (11/21 0600) Weight:  [119.3 kg (263 lb 0.1 oz)-119.8 kg (264 lb 1.8 oz)] 119.3 kg (263 lb 0.1 oz) (11/21 0400)  Intake/Output from previous day: 11/20 0701 - 11/21 0700 In: 1961.6 [I.V.:1861.6; IV Piggyback:100] Out: 100 [Urine:100] Intake/Output this shift: No intake/output data recorded. Nutritional status: Diet NPO time specified  Neurologic Exam: Mentation: Minimal movement of right lower extremity to plantar stimulation and slight right eyelid opening to sternal rub. Otherwise, no change to examination, with no other semipurposeful response to auditory, light or noxious stimuli. CN: Left pupil 3 mm and sluggishly reactive. Right pupil 3 mm and unreactive. No blink to threat. Brisk corneal reflexes bilaterally. Motor/Sensory: Flaccid tone x 4. Slight 2/5 movement of RLE to noxious. No movement of BUE or LLE to noxious.  Reflexes: 0 upper and lower extremities. Toes mute.   Lab Results: Results for orders placed or performed during the hospital encounter of 04/21/2017 (from the past 48 hour(s))  CBC WITH DIFFERENTIAL     Status: Abnormal   Collection Time: 04/20/2017  9:08 AM  Result Value Ref Range   WBC 18.9 (H) 4.0 - 10.5 K/uL   RBC 5.54 4.22 - 5.81 MIL/uL   Hemoglobin 15.5 13.0 - 17.0 g/dL   HCT 44.7 39.0 - 52.0 %   MCV 80.7 78.0 - 100.0 fL   MCH 28.0 26.0 - 34.0 pg   MCHC 34.7 30.0 -  36.0 g/dL   RDW 13.7 11.5 - 15.5 %   Platelets 296 150 - 400 K/uL   Neutrophils Relative % 74 %   Neutro Abs 13.9 (H) 1.7 - 7.7 K/uL   Lymphocytes Relative 21 %   Lymphs Abs 4.0 0.7 - 4.0 K/uL   Monocytes Relative 5 %   Monocytes Absolute 0.9 0.1 - 1.0 K/uL   Eosinophils Relative 0 %   Eosinophils Absolute 0.1 0.0 - 0.7 K/uL   Basophils Relative 0 %   Basophils Absolute 0.0 0.0 - 0.1 K/uL  Comprehensive metabolic panel     Status: Abnormal   Collection Time: 04/27/2017  9:08 AM  Result Value Ref Range   Sodium 136 135 - 145 mmol/L   Potassium 3.1 (L) 3.5 - 5.1 mmol/L   Chloride 98 (L) 101 - 111 mmol/L   CO2 22 22 - 32 mmol/L   Glucose, Bld 560 (HH) 65 - 99 mg/dL    Comment: CRITICAL RESULT CALLED TO, READ BACK BY AND VERIFIED WITH: WHITE,M AT 9:456AM ON 04/15/2017 BY FESTERMAN,C    BUN 15 6 - 20 mg/dL   Creatinine, Ser 1.54 (H) 0.61 - 1.24 mg/dL   Calcium 9.6 8.9 - 10.3 mg/dL   Total Protein 8.4 (H) 6.5 - 8.1 g/dL   Albumin 4.0 3.5 - 5.0 g/dL   AST 25 15 - 41  U/L   ALT 26 17 - 63 U/L   Alkaline Phosphatase 114 38 - 126 U/L   Total Bilirubin 0.8 0.3 - 1.2 mg/dL   GFR calc non Af Amer 52 (L) >60 mL/min   GFR calc Af Amer 60 (L) >60 mL/min    Comment: (NOTE) The eGFR has been calculated using the CKD EPI equation. This calculation has not been validated in all clinical situations. eGFR's persistently <60 mL/min signify possible Chronic Kidney Disease.    Anion gap 16 (H) 5 - 15  Ethanol     Status: None   Collection Time: 04/04/2017  9:08 AM  Result Value Ref Range   Alcohol, Ethyl (B) <10 <10 mg/dL    Comment:        LOWEST DETECTABLE LIMIT FOR SERUM ALCOHOL IS 10 mg/dL FOR MEDICAL PURPOSES ONLY   I-stat chem 8, ed     Status: Abnormal   Collection Time: 04/24/2017  9:11 AM  Result Value Ref Range   Sodium 138 135 - 145 mmol/L   Potassium 3.8 3.5 - 5.1 mmol/L   Chloride 101 101 - 111 mmol/L   BUN 18 6 - 20 mg/dL   Creatinine, Ser 1.30 (H) 0.61 - 1.24 mg/dL   Glucose,  Bld 590 (HH) 65 - 99 mg/dL   Calcium, Ion 1.20 1.15 - 1.40 mmol/L   TCO2 27 22 - 32 mmol/L   Hemoglobin 16.3 13.0 - 17.0 g/dL   HCT 48.0 39.0 - 52.0 %  Troponin I     Status: None   Collection Time: 04/13/2017  9:11 AM  Result Value Ref Range   Troponin I <0.03 <0.03 ng/mL  CBG monitoring, ED     Status: Abnormal   Collection Time: 04/30/2017  9:32 AM  Result Value Ref Range   Glucose-Capillary >600 (HH) 65 - 99 mg/dL  Urinalysis, Routine w reflex microscopic     Status: Abnormal   Collection Time: 04/18/2017 10:24 AM  Result Value Ref Range   Color, Urine STRAW (A) YELLOW   APPearance CLEAR CLEAR   Specific Gravity, Urine 1.020 1.005 - 1.030   pH 5.0 5.0 - 8.0   Glucose, UA >=500 (A) NEGATIVE mg/dL   Hgb urine dipstick MODERATE (A) NEGATIVE   Bilirubin Urine NEGATIVE NEGATIVE   Ketones, ur NEGATIVE NEGATIVE mg/dL   Protein, ur 100 (A) NEGATIVE mg/dL   Nitrite NEGATIVE NEGATIVE   Leukocytes, UA NEGATIVE NEGATIVE   RBC / HPF 0-5 0 - 5 RBC/hpf   WBC, UA 0-5 0 - 5 WBC/hpf   Bacteria, UA RARE (A) NONE SEEN   Squamous Epithelial / LPF 0-5 (A) NONE SEEN  Rapid urine drug screen (hospital performed)     Status: None   Collection Time: 04/18/2017 10:24 AM  Result Value Ref Range   Opiates NONE DETECTED NONE DETECTED   Cocaine NONE DETECTED NONE DETECTED   Benzodiazepines NONE DETECTED NONE DETECTED   Amphetamines NONE DETECTED NONE DETECTED   Tetrahydrocannabinol NONE DETECTED NONE DETECTED   Barbiturates NONE DETECTED NONE DETECTED    Comment:        DRUG SCREEN FOR MEDICAL PURPOSES ONLY.  IF CONFIRMATION IS NEEDED FOR ANY PURPOSE, NOTIFY LAB WITHIN 5 DAYS.        LOWEST DETECTABLE LIMITS FOR URINE DRUG SCREEN Drug Class       Cutoff (ng/mL) Amphetamine      1000 Barbiturate      200 Benzodiazepine   312 Tricyclics  300 Opiates          300 Cocaine          300 THC              50   Blood gas, arterial ([facility] & [facility] ONLY)     Status: Abnormal   Collection Time: 04/14/2017  10:24 AM  Result Value Ref Range   FIO2 1.00    O2 Content 100.0 L/min   Delivery systems VENTILATOR    Mode PRESSURE REGULATED VOLUME CONTROL    VT 550 mL   LHR 14 resp/min   Peep/cpap 5.0 cm H20   pH, Arterial 7.234 (L) 7.350 - 7.450   pCO2 arterial 60.5 (H) 32.0 - 48.0 mmHg   pO2, Arterial 309 (H) 83.0 - 108.0 mmHg   Bicarbonate 21.4 20.0 - 28.0 mmol/L   Acid-base deficit 1.9 0.0 - 2.0 mmol/L   O2 Saturation 99.2 %   Collection site RIGHT RADIAL    Drawn by [phone]    Sample type ARTERIAL    Allens test (pass/fail) PASS PASS  CBG monitoring, ED     Status: Abnormal   Collection Time: 05/01/2017 11:24 AM  Result Value Ref Range   Glucose-Capillary >600 (HH) 65 - 99 mg/dL  CBG monitoring, ED     Status: Abnormal   Collection Time: 04/25/2017 12:36 PM  Result Value Ref Range   Glucose-Capillary >600 (HH) 65 - 99 mg/dL  Lactic acid, plasma     Status: Abnormal   Collection Time: 04/18/2017 12:42 PM  Result Value Ref Range   Lactic Acid, Venous 2.0 (HH) 0.5 - 1.9 mmol/L    Comment: CRITICAL RESULT CALLED TO, READ BACK BY AND VERIFIED WITH: WHITE,M AT 1340 ON 11.19.2018 BY ISLEY,B   CBG monitoring, ED     Status: Abnormal   Collection Time: 04/15/2017  1:41 PM  Result Value Ref Range   Glucose-Capillary 559 (HH) 65 - 99 mg/dL   Comment 1 Document in Chart   Glucose, capillary     Status: Abnormal   Collection Time: 04/07/2017  3:01 PM  Result Value Ref Range   Glucose-Capillary 458 (H) 65 - 99 mg/dL   Comment 1 Notify RN    Comment 2 Document in Chart   MRSA PCR Screening     Status: None   Collection Time: 04/21/2017  3:32 PM  Result Value Ref Range   MRSA by PCR NEGATIVE NEGATIVE    Comment:        The GeneXpert MRSA Assay (FDA approved for NASAL specimens only), is one component of a comprehensive MRSA colonization surveillance program. It is not intended to diagnose MRSA infection nor to guide or monitor treatment for MRSA infections.   Glucose, capillary     Status:  Abnormal   Collection Time: 04/18/2017  3:52 PM  Result Value Ref Range   Glucose-Capillary 440 (H) 65 - 99 mg/dL  HIV antibody (Routine Testing)     Status: None   Collection Time: 04/18/2017  4:00 PM  Result Value Ref Range   HIV Screen 4th Generation wRfx Non Reactive Non Reactive    Comment: (NOTE) Performed At: [facility] [address] [place], [place] [id number] Rush Farmer MD KP:[id number]   Culture, expectorated sputum-assessment     Status: None   Collection Time: 04/24/2017  4:40 PM  Result Value Ref Range   Specimen Description SPU    Special Requests SPU    Sputum evaluation THIS SPECIMEN IS ACCEPTABLE FOR SPUTUM  CULTURE    Report Status 04/23/2017 FINAL   Culture, respiratory (NON-Expectorated)     Status: None (Preliminary result)   Collection Time: 04/08/2017  4:40 PM  Result Value Ref Range   Specimen Description SPU    Special Requests SPU Reflexed from [id number]    Gram Stain      FEW WBC PRESENT, PREDOMINANTLY PMN RARE SQUAMOUS EPITHELIAL CELLS PRESENT MODERATE GRAM POSITIVE COCCI IN CHAINS MODERATE BUDDING YEAST SEEN FEW GRAM NEGATIVE RODS Performed at [facility], [place] [address], [place], [place] [address]    Culture PENDING    Report Status PENDING   Blood gas, arterial     Status: Abnormal   Collection Time: 04/04/2017  4:45 PM  Result Value Ref Range   FIO2 70.00    Delivery systems VENTILATOR    Mode PRESSURE REGULATED VOLUME CONTROL    VT 550 mL   LHR 14 resp/min   Peep/cpap 5.0 cm H20   pH, Arterial 7.309 (L) 7.350 - 7.450   pCO2 arterial 40.7 32.0 - 48.0 mmHg   pO2, Arterial 60.0 (L) 83.0 - 108.0 mmHg   Bicarbonate 19.7 (L) 20.0 - 28.0 mmol/L   Acid-base deficit 5.3 (H) 0.0 - 2.0 mmol/L   O2 Saturation 88.5 %   Patient temperature 37.0    Collection site RIGHT RADIAL    Drawn by [phone]    Sample type ARTERIAL DRAW    Allens test (pass/fail) PASS PASS  Glucose, capillary     Status: Abnormal   Collection Time: 04/08/2017  5:00  PM  Result Value Ref Range   Glucose-Capillary 386 (H) 65 - 99 mg/dL  Glucose, capillary     Status: Abnormal   Collection Time: 04/30/2017  6:02 PM  Result Value Ref Range   Glucose-Capillary 265 (H) 65 - 99 mg/dL  Glucose, capillary     Status: Abnormal   Collection Time: 04/09/2017  6:47 PM  Result Value Ref Range   Glucose-Capillary 195 (H) 65 - 99 mg/dL  Culture, blood (Routine X 2) w Reflex to ID Panel     Status: None (Preliminary result)   Collection Time: 04/06/2017  6:58 PM  Result Value Ref Range   Specimen Description BLOOD RIGHT ARM    Special Requests      BOTTLES DRAWN AEROBIC AND ANAEROBIC Blood Culture adequate volume   Culture  Setup Time      GRAM POSITIVE COCCI AEB BOTTLE Gram Stain Report Called to,Read Back By and Verified With: JAMIE WHITE, RN @ [facility] @ 1600 ON 11.20.18 BY BOWMAN,L CRITICAL RESULT CALLED TO, READ BACK BY AND VERIFIED WITH: Zoila Shutter [phone] MLM Performed at [facility], [address], [place], [place] [address]    Culture GRAM POSITIVE COCCI    Report Status PENDING   Blood Culture ID Panel (Reflexed)     Status: Abnormal   Collection Time: 04/16/2017  6:58 PM  Result Value Ref Range   Enterococcus species NOT DETECTED NOT DETECTED   Listeria monocytogenes NOT DETECTED NOT DETECTED   Staphylococcus species DETECTED (A) NOT DETECTED    Comment: Methicillin (oxacillin) susceptible coagulase negative staphylococcus. Possible blood culture contaminant (unless isolated from more than one blood culture draw or clinical case suggests pathogenicity). No antibiotic treatment is indicated for blood  culture contaminants. CRITICAL RESULT CALLED TO, READ BACK BY AND VERIFIED WITH: PHARMD V BRYK [phone] MLM    Staphylococcus aureus NOT DETECTED NOT DETECTED   Methicillin resistance NOT  DETECTED NOT DETECTED   Streptococcus species NOT DETECTED NOT DETECTED   Streptococcus agalactiae NOT DETECTED NOT DETECTED   Streptococcus pneumoniae NOT  DETECTED NOT DETECTED   Streptococcus pyogenes NOT DETECTED NOT DETECTED   Acinetobacter baumannii NOT DETECTED NOT DETECTED   Enterobacteriaceae species NOT DETECTED NOT DETECTED   Enterobacter cloacae complex NOT DETECTED NOT DETECTED   Escherichia coli NOT DETECTED NOT DETECTED   Klebsiella oxytoca NOT DETECTED NOT DETECTED   Klebsiella pneumoniae NOT DETECTED NOT DETECTED   Proteus species NOT DETECTED NOT DETECTED   Serratia marcescens NOT DETECTED NOT DETECTED   Haemophilus influenzae NOT DETECTED NOT DETECTED   Neisseria meningitidis NOT DETECTED NOT DETECTED   Pseudomonas aeruginosa NOT DETECTED NOT DETECTED   Candida albicans NOT DETECTED NOT DETECTED   Candida glabrata NOT DETECTED NOT DETECTED   Candida krusei NOT DETECTED NOT DETECTED   Candida parapsilosis NOT DETECTED NOT DETECTED   Candida tropicalis NOT DETECTED NOT DETECTED    Comment: Performed at [facility], [place] [address] [place], [place] [address]  Culture, blood (Routine X 2) w Reflex to ID Panel     Status: None (Preliminary result)   Collection Time: 04/26/2017  6:59 PM  Result Value Ref Range   Specimen Description BLOOD LEFT ARM    Special Requests      BOTTLES DRAWN AEROBIC AND ANAEROBIC Blood Culture adequate volume   Culture NO GROWTH 2 DAYS    Report Status PENDING   Lactic acid, plasma     Status: Abnormal   Collection Time: 04/13/2017  6:59 PM  Result Value Ref Range   Lactic Acid, Venous 5.0 (HH) 0.5 - 1.9 mmol/L    Comment: CRITICAL RESULT CALLED TO, READ BACK BY AND VERIFIED WITH: HERAN,J AT 2000 ON 11.19.2018 BY ISLEY,B   Glucose, capillary     Status: Abnormal   Collection Time: 04/04/2017  7:34 PM  Result Value Ref Range   Glucose-Capillary 222 (H) 65 - 99 mg/dL   Comment 1 Notify RN   Glucose, capillary     Status: Abnormal   Collection Time: 04/24/2017  8:41 PM  Result Value Ref Range   Glucose-Capillary 227 (H) 65 - 99 mg/dL   Comment 1 Notify RN   Glucose, capillary     Status:  Abnormal   Collection Time: 04/20/2017  9:46 PM  Result Value Ref Range   Glucose-Capillary 160 (H) 65 - 99 mg/dL   Comment 1 Notify RN   Glucose, capillary     Status: Abnormal   Collection Time: 04/20/2017 10:46 PM  Result Value Ref Range   Glucose-Capillary 149 (H) 65 - 99 mg/dL   Comment 1 Notify RN   Glucose, capillary     Status: Abnormal   Collection Time: 04/08/2017 11:52 PM  Result Value Ref Range   Glucose-Capillary 225 (H) 65 - 99 mg/dL   Comment 1 Notify RN   Basic metabolic panel     Status: Abnormal   Collection Time: 04/23/17 12:25 AM  Result Value Ref Range   Sodium 135 135 - 145 mmol/L   Potassium 2.6 (LL) 3.5 - 5.1 mmol/L    Comment: CRITICAL RESULT CALLED TO, READ BACK BY AND VERIFIED WITH: HEARN,J @0152  BY MATTHEWS B 11.20.18 DELTA CHECK NOTED    Chloride 112 (H) 101 - 111 mmol/L   CO2 19 (L) 22 - 32 mmol/L   Glucose, Bld 764 (HH) 65 - 99 mg/dL    Comment: CRITICAL RESULT CALLED TO, READ BACK  BY AND VERIFIED WITH: HEARN,J @ 0152 BY MATTHEWS, B 11.20.18    BUN 18 6 - 20 mg/dL   Creatinine, Ser 2.91 (H) 0.61 - 1.24 mg/dL   Calcium 5.6 (LL) 8.9 - 10.3 mg/dL    Comment: DELTA CHECK NOTED CRITICAL RESULT CALLED TO, READ BACK BY AND VERIFIED WITH: HEARN,J @ 0152BY MATTHEWS, B 11.20.18     GFR calc non Af Amer 24 (L) >60 mL/min   GFR calc Af Amer 28 (L) >60 mL/min    Comment: (NOTE) The eGFR has been calculated using the CKD EPI equation. This calculation has not been validated in all clinical situations. eGFR's persistently <60 mL/min signify possible Chronic Kidney Disease.    Anion gap 4 (L) 5 - 15  Glucose, capillary     Status: Abnormal   Collection Time: 04/23/17 12:42 AM  Result Value Ref Range   Glucose-Capillary 164 (H) 65 - 99 mg/dL   Comment 1 Notify RN   Blood gas, arterial     Status: Abnormal   Collection Time: 04/23/17  1:20 AM  Result Value Ref Range   FIO2 100.00    Delivery systems VENTILATOR    Mode PRESSURE REGULATED VOLUME CONTROL     VT 550 mL   LHR 14 resp/min   Peep/cpap 10.0 cm H20   pH, Arterial 7.218 (L) 7.350 - 7.450   pCO2 arterial 58.0 (H) 32.0 - 48.0 mmHg   pO2, Arterial 96.0 83.0 - 108.0 mmHg   Bicarbonate 20.0 20.0 - 28.0 mmol/L   Acid-base deficit 4.0 (H) 0.0 - 2.0 mmol/L   O2 Saturation 95.3 %   Patient temperature 38.0    Collection site LEFT RADIAL    Drawn by [id number]    Sample type ARTERIAL    Allens test (pass/fail) PASS PASS  Glucose, capillary     Status: Abnormal   Collection Time: 04/23/17  1:40 AM  Result Value Ref Range   Glucose-Capillary 141 (H) 65 - 99 mg/dL   Comment 1 Notify RN   Basic metabolic panel     Status: Abnormal   Collection Time: 04/23/17  2:01 AM  Result Value Ref Range   Sodium 146 (H) 135 - 145 mmol/L    Comment: DELTA CHECK NOTED   Potassium 4.3 3.5 - 5.1 mmol/L    Comment: DELTA CHECK NOTED   Chloride 116 (H) 101 - 111 mmol/L   CO2 25 22 - 32 mmol/L   Glucose, Bld 145 (H) 65 - 99 mg/dL   BUN 24 (H) 6 - 20 mg/dL   Creatinine, Ser 3.87 (H) 0.61 - 1.24 mg/dL   Calcium 8.0 (L) 8.9 - 10.3 mg/dL    Comment: DELTA CHECK NOTED   GFR calc non Af Amer 17 (L) >60 mL/min   GFR calc Af Amer 20 (L) >60 mL/min    Comment: (NOTE) The eGFR has been calculated using the CKD EPI equation. This calculation has not been validated in all clinical situations. eGFR's persistently <60 mL/min signify possible Chronic Kidney Disease.    Anion gap 5 5 - 15  CBC     Status: Abnormal   Collection Time: 04/23/17  2:01 AM  Result Value Ref Range   WBC 18.6 (H) 4.0 - 10.5 K/uL   RBC 4.77 4.22 - 5.81 MIL/uL   Hemoglobin 13.4 13.0 - 17.0 g/dL   HCT 40.9 39.0 - 52.0 %   MCV 85.7 78.0 - 100.0 fL   MCH 28.1 26.0 - 34.0 pg   MCHC  32.8 30.0 - 36.0 g/dL   RDW 14.5 11.5 - 15.5 %   Platelets 210 150 - 400 K/uL  Glucose, capillary     Status: Abnormal   Collection Time: 04/23/17  2:46 AM  Result Value Ref Range   Glucose-Capillary 143 (H) 65 - 99 mg/dL   Comment 1 Notify RN   Glucose,  capillary     Status: Abnormal   Collection Time: 04/23/17  3:38 AM  Result Value Ref Range   Glucose-Capillary 157 (H) 65 - 99 mg/dL   Comment 1 Notify RN   Glucose, capillary     Status: Abnormal   Collection Time: 04/23/17  4:49 AM  Result Value Ref Range   Glucose-Capillary 148 (H) 65 - 99 mg/dL  Glucose, capillary     Status: Abnormal   Collection Time: 04/23/17  5:46 AM  Result Value Ref Range   Glucose-Capillary 153 (H) 65 - 99 mg/dL   Comment 1 Notify RN   Basic metabolic panel     Status: Abnormal   Collection Time: 04/23/17  6:23 AM  Result Value Ref Range   Sodium 141 135 - 145 mmol/L   Potassium 5.5 (H) 3.5 - 5.1 mmol/L    Comment: DELTA CHECK NOTED   Chloride 110 101 - 111 mmol/L   CO2 24 22 - 32 mmol/L   Glucose, Bld 157 (H) 65 - 99 mg/dL   BUN 26 (H) 6 - 20 mg/dL   Creatinine, Ser 4.66 (H) 0.61 - 1.24 mg/dL   Calcium 7.5 (L) 8.9 - 10.3 mg/dL   GFR calc non Af Amer 14 (L) >60 mL/min   GFR calc Af Amer 16 (L) >60 mL/min    Comment: (NOTE) The eGFR has been calculated using the CKD EPI equation. This calculation has not been validated in all clinical situations. eGFR's persistently <60 mL/min signify possible Chronic Kidney Disease.    Anion gap 7 5 - 15  Glucose, capillary     Status: Abnormal   Collection Time: 04/23/17  6:33 AM  Result Value Ref Range   Glucose-Capillary 220 (H) 65 - 99 mg/dL   Comment 1 Notify RN   Glucose, capillary     Status: Abnormal   Collection Time: 04/23/17  7:39 AM  Result Value Ref Range   Glucose-Capillary 161 (H) 65 - 99 mg/dL  Glucose, capillary     Status: Abnormal   Collection Time: 04/23/17  8:48 AM  Result Value Ref Range   Glucose-Capillary 148 (H) 65 - 99 mg/dL  Glucose, capillary     Status: Abnormal   Collection Time: 04/23/17  9:51 AM  Result Value Ref Range   Glucose-Capillary 154 (H) 65 - 99 mg/dL  Glucose, capillary     Status: Abnormal   Collection Time: 04/23/17 10:48 AM  Result Value Ref Range    Glucose-Capillary 191 (H) 65 - 99 mg/dL  Glucose, capillary     Status: Abnormal   Collection Time: 04/23/17 11:35 AM  Result Value Ref Range   Glucose-Capillary 124 (H) 65 - 99 mg/dL  Glucose, capillary     Status: Abnormal   Collection Time: 04/23/17 12:19 PM  Result Value Ref Range   Glucose-Capillary 157 (H) 65 - 99 mg/dL  Glucose, capillary     Status: Abnormal   Collection Time: 04/23/17  1:40 PM  Result Value Ref Range   Glucose-Capillary 173 (H) 65 - 99 mg/dL  I-STAT 3, arterial blood gas (G3+)     Status: Abnormal  Collection Time: 04/23/17  2:37 PM  Result Value Ref Range   pH, Arterial 7.141 (LL) 7.350 - 7.450   pCO2 arterial 66.2 (HH) 32.0 - 48.0 mmHg   pO2, Arterial 94.0 83.0 - 108.0 mmHg   Bicarbonate 22.4 20.0 - 28.0 mmol/L   TCO2 24 22 - 32 mmol/L   O2 Saturation 94.0 %   Acid-base deficit 7.0 (H) 0.0 - 2.0 mmol/L   Patient temperature 99.5 F    Collection site RADIAL, ALLEN'S TEST ACCEPTABLE    Drawn by Operator    Sample type ARTERIAL    Comment NOTIFIED PHYSICIAN   Glucose, capillary     Status: Abnormal   Collection Time: 04/23/17  2:48 PM  Result Value Ref Range   Glucose-Capillary 198 (H) 65 - 99 mg/dL  Lactic acid, plasma     Status: None   Collection Time: 04/23/17  3:23 PM  Result Value Ref Range   Lactic Acid, Venous 1.7 0.5 - 1.9 mmol/L  Procalcitonin - Baseline     Status: None   Collection Time: 04/23/17  3:23 PM  Result Value Ref Range   Procalcitonin >150.00 ng/mL    Comment:        Interpretation: PCT >= 10 ng/mL: Important systemic inflammatory response, almost exclusively due to severe bacterial sepsis or septic shock. (NOTE)         ICU PCT Algorithm               Non ICU PCT Algorithm    ----------------------------     ------------------------------         PCT < 0.25 ng/mL                 PCT < 0.1 ng/mL     Stopping of antibiotics            Stopping of antibiotics       strongly encouraged.               strongly  encouraged.    ----------------------------     ------------------------------       PCT level decrease by               PCT < 0.25 ng/mL       >= 80% from peak PCT       OR PCT 0.25 - 0.5 ng/mL          Stopping of antibiotics                                             encouraged.     Stopping of antibiotics           encouraged.    ----------------------------     ------------------------------       PCT level decrease by              PCT >= 0.25 ng/mL       < 80% from peak PCT        AND PCT >= 0.5 ng/mL             Continuing antibiotics                                              encouraged.  Continuing antibiotics            encouraged.    ----------------------------     ------------------------------     PCT level increase compared          PCT > 0.5 ng/mL         with peak PCT AND          PCT >= 0.5 ng/mL             Escalation of antibiotics                                          strongly encouraged.      Escalation of antibiotics        strongly encouraged.   Renal function panel     Status: Abnormal   Collection Time: 04/23/17  3:23 PM  Result Value Ref Range   Sodium 134 (L) 135 - 145 mmol/L   Potassium 5.4 (H) 3.5 - 5.1 mmol/L   Chloride 107 101 - 111 mmol/L   CO2 18 (L) 22 - 32 mmol/L   Glucose, Bld 697 (HH) 65 - 99 mg/dL    Comment: CRITICAL RESULT CALLED TO, READ BACK BY AND VERIFIED WITH: HESTER,B RN 04/23/2017 1851 JORDANS SUGGESTED RECOLLECT TO VERIFY    BUN 29 (H) 6 - 20 mg/dL   Creatinine, Ser 5.30 (H) 0.61 - 1.24 mg/dL   Calcium 6.1 (LL) 8.9 - 10.3 mg/dL    Comment: CRITICAL RESULT CALLED TO, READ BACK BY AND VERIFIED WITH: HESTER,B RN 04/23/2017 1851 JORDANS    Phosphorus 4.5 2.5 - 4.6 mg/dL   Albumin 2.1 (L) 3.5 - 5.0 g/dL   GFR calc non Af Amer 12 (L) >60 mL/min   GFR calc Af Amer 13 (L) >60 mL/min    Comment: (NOTE) The eGFR has been calculated using the CKD EPI equation. This calculation has not been validated in all clinical  situations. eGFR's persistently <60 mL/min signify possible Chronic Kidney Disease.    Anion gap 9 5 - 15  Magnesium     Status: Abnormal   Collection Time: 04/23/17  3:23 PM  Result Value Ref Range   Magnesium 1.3 (L) 1.7 - 2.4 mg/dL  Cortisol     Status: None   Collection Time: 04/23/17  3:23 PM  Result Value Ref Range   Cortisol, Plasma 14.1 ug/dL    Comment: (NOTE) AM    6.7 - 22.6 ug/dL PM   <10.0       ug/dL   Hemoglobin A1c     Status: Abnormal   Collection Time: 04/23/17  3:23 PM  Result Value Ref Range   Hgb A1c MFr Bld 9.4 (H) 4.8 - 5.6 %    Comment: (NOTE) Pre diabetes:          5.7%-6.4% Diabetes:              >6.4% Glycemic control for   <7.0% adults with diabetes    Mean Plasma Glucose 223.08 mg/dL  TSH     Status: None   Collection Time: 04/23/17  3:23 PM  Result Value Ref Range   TSH 1.292 0.350 - 4.500 uIU/mL    Comment: Performed by a 3rd Generation assay with a functional sensitivity of <=0.01 uIU/mL.  Creatinine, urine, random     Status: None   Collection Time: 04/23/17  3:32 PM  Result Value Ref  Range   Creatinine, Urine 260.09 mg/dL  Sodium, urine, random     Status: None   Collection Time: 04/23/17  3:32 PM  Result Value Ref Range   Sodium, Ur 50 mmol/L  Glucose, capillary     Status: Abnormal   Collection Time: 04/23/17  3:41 PM  Result Value Ref Range   Glucose-Capillary 217 (H) 65 - 99 mg/dL   Comment 1 Notify RN    Comment 2 Document in Chart   .Cooxemetry Panel (carboxy, met, total hgb, O2 sat)     Status: Abnormal   Collection Time: 04/23/17  4:50 PM  Result Value Ref Range   Total hemoglobin 11.7 (L) 12.0 - 16.0 g/dL   O2 Saturation 89.5 %   Carboxyhemoglobin 0.7 0.5 - 1.5 %   Methemoglobin 1.1 0.0 - 1.5 %  Glucose, capillary     Status: Abnormal   Collection Time: 04/23/17  4:51 PM  Result Value Ref Range   Glucose-Capillary 246 (H) 65 - 99 mg/dL  I-STAT 3, arterial blood gas (G3+)     Status: Abnormal   Collection Time:  04/23/17  5:04 PM  Result Value Ref Range   pH, Arterial 7.204 (L) 7.350 - 7.450   pCO2 arterial 53.4 (H) 32.0 - 48.0 mmHg   pO2, Arterial 102.0 83.0 - 108.0 mmHg   Bicarbonate 20.8 20.0 - 28.0 mmol/L   TCO2 22 22 - 32 mmol/L   O2 Saturation 96.0 %   Acid-base deficit 7.0 (H) 0.0 - 2.0 mmol/L   Patient temperature 100.3 F    Collection site RADIAL, ALLEN'S TEST ACCEPTABLE    Drawn by RT    Sample type ARTERIAL   Glucose, capillary     Status: Abnormal   Collection Time: 04/23/17  5:56 PM  Result Value Ref Range   Glucose-Capillary 215 (H) 65 - 99 mg/dL  Lipid panel     Status: Abnormal   Collection Time: 04/23/17  6:53 PM  Result Value Ref Range   Cholesterol 125 0 - 200 mg/dL   Triglycerides 154 (H) <150 mg/dL   HDL 36 (L) >40 mg/dL   Total CHOL/HDL Ratio 3.5 RATIO   VLDL 31 0 - 40 mg/dL   LDL Cholesterol 58 0 - 99 mg/dL    Comment:        Total Cholesterol/HDL:CHD Risk Coronary Heart Disease Risk Table                     Men   Women  1/2 Average Risk   3.4   3.3  Average Risk       5.0   4.4  2 X Average Risk   9.6   7.1  3 X Average Risk  23.4   11.0        Use the calculated Patient Ratio above and the CHD Risk Table to determine the patient's CHD Risk.        ATP III CLASSIFICATION (LDL):  <100     mg/dL   Optimal  100-129  mg/dL   Near or Above                    Optimal  130-159  mg/dL   Borderline  160-189  mg/dL   High  >190     mg/dL   Very High   Glucose, capillary     Status: Abnormal   Collection Time: 04/23/17  6:58 PM  Result Value Ref Range   Glucose-Capillary 246 (  H) 65 - 99 mg/dL  Glucose, capillary     Status: Abnormal   Collection Time: 04/23/17  8:01 PM  Result Value Ref Range   Glucose-Capillary 181 (H) 65 - 99 mg/dL  Glucose, capillary     Status: Abnormal   Collection Time: 04/23/17  9:02 PM  Result Value Ref Range   Glucose-Capillary 160 (H) 65 - 99 mg/dL  Glucose, capillary     Status: Abnormal   Collection Time: 04/23/17 10:00 PM   Result Value Ref Range   Glucose-Capillary 152 (H) 65 - 99 mg/dL  Glucose, capillary     Status: Abnormal   Collection Time: 04/23/17 11:06 PM  Result Value Ref Range   Glucose-Capillary 157 (H) 65 - 99 mg/dL  Lactic acid, plasma     Status: None   Collection Time: 04/23/17 11:15 PM  Result Value Ref Range   Lactic Acid, Venous 1.6 0.5 - 1.9 mmol/L  Comprehensive metabolic panel     Status: Abnormal   Collection Time: 04/23/17 11:15 PM  Result Value Ref Range   Sodium 139 135 - 145 mmol/L   Potassium 5.0 3.5 - 5.1 mmol/L   Chloride 110 101 - 111 mmol/L   CO2 20 (L) 22 - 32 mmol/L   Glucose, Bld 142 (H) 65 - 99 mg/dL   BUN 39 (H) 6 - 20 mg/dL   Creatinine, Ser 6.96 (H) 0.61 - 1.24 mg/dL   Calcium 6.9 (L) 8.9 - 10.3 mg/dL   Total Protein 5.8 (L) 6.5 - 8.1 g/dL   Albumin 2.3 (L) 3.5 - 5.0 g/dL   AST 531 (H) 15 - 41 U/L   ALT 264 (H) 17 - 63 U/L   Alkaline Phosphatase 59 38 - 126 U/L   Total Bilirubin 1.0 0.3 - 1.2 mg/dL   GFR calc non Af Amer 8 (L) >60 mL/min   GFR calc Af Amer 10 (L) >60 mL/min    Comment: (NOTE) The eGFR has been calculated using the CKD EPI equation. This calculation has not been validated in all clinical situations. eGFR's persistently <60 mL/min signify possible Chronic Kidney Disease.    Anion gap 9 5 - 15  Glucose, capillary     Status: Abnormal   Collection Time: 04/23/17 11:52 PM  Result Value Ref Range   Glucose-Capillary 142 (H) 65 - 99 mg/dL  Glucose, capillary     Status: Abnormal   Collection Time: 04/24/17 12:54 AM  Result Value Ref Range   Glucose-Capillary 139 (H) 65 - 99 mg/dL  Glucose, capillary     Status: Abnormal   Collection Time: 04/24/17  1:57 AM  Result Value Ref Range   Glucose-Capillary 137 (H) 65 - 99 mg/dL  Glucose, capillary     Status: Abnormal   Collection Time: 04/24/17  2:55 AM  Result Value Ref Range   Glucose-Capillary 143 (H) 65 - 99 mg/dL  Procalcitonin     Status: None   Collection Time: 04/24/17  4:32 AM   Result Value Ref Range   Procalcitonin >150.00 ng/mL    Comment:        Interpretation: PCT >= 10 ng/mL: Important systemic inflammatory response, almost exclusively due to severe bacterial sepsis or septic shock. (NOTE)         ICU PCT Algorithm               Non ICU PCT Algorithm    ----------------------------     ------------------------------         PCT <  0.25 ng/mL                 PCT < 0.1 ng/mL     Stopping of antibiotics            Stopping of antibiotics       strongly encouraged.               strongly encouraged.    ----------------------------     ------------------------------       PCT level decrease by               PCT < 0.25 ng/mL       >= 80% from peak PCT       OR PCT 0.25 - 0.5 ng/mL          Stopping of antibiotics                                             encouraged.     Stopping of antibiotics           encouraged.    ----------------------------     ------------------------------       PCT level decrease by              PCT >= 0.25 ng/mL       < 80% from peak PCT        AND PCT >= 0.5 ng/mL             Continuing antibiotics                                              encouraged.       Continuing antibiotics            encouraged.    ----------------------------     ------------------------------     PCT level increase compared          PCT > 0.5 ng/mL         with peak PCT AND          PCT >= 0.5 ng/mL             Escalation of antibiotics                                          strongly encouraged.      Escalation of antibiotics        strongly encouraged.   CBC     Status: Abnormal   Collection Time: 04/24/17  4:32 AM  Result Value Ref Range   WBC 17.1 (H) 4.0 - 10.5 K/uL   RBC 4.17 (L) 4.22 - 5.81 MIL/uL   Hemoglobin 11.8 (L) 13.0 - 17.0 g/dL   HCT 34.7 (L) 39.0 - 52.0 %   MCV 83.2 78.0 - 100.0 fL   MCH 28.3 26.0 - 34.0 pg   MCHC 34.0 30.0 - 36.0 g/dL   RDW 15.5 11.5 - 15.5 %   Platelets 192 150 - 400 K/uL  Comprehensive metabolic  panel     Status: Abnormal   Collection Time: 04/24/17  4:32 AM  Result Value Ref Range   Sodium 140 135 - 145  mmol/L   Potassium 5.5 (H) 3.5 - 5.1 mmol/L   Chloride 111 101 - 111 mmol/L   CO2 20 (L) 22 - 32 mmol/L   Glucose, Bld 142 (H) 65 - 99 mg/dL   BUN 45 (H) 6 - 20 mg/dL   Creatinine, Ser 7.71 (H) 0.61 - 1.24 mg/dL   Calcium 7.1 (L) 8.9 - 10.3 mg/dL   Total Protein 5.9 (L) 6.5 - 8.1 g/dL   Albumin 2.2 (L) 3.5 - 5.0 g/dL   AST 580 (H) 15 - 41 U/L   ALT 294 (H) 17 - 63 U/L   Alkaline Phosphatase 67 38 - 126 U/L   Total Bilirubin 1.0 0.3 - 1.2 mg/dL   GFR calc non Af Amer 7 (L) >60 mL/min   GFR calc Af Amer 9 (L) >60 mL/min    Comment: (NOTE) The eGFR has been calculated using the CKD EPI equation. This calculation has not been validated in all clinical situations. eGFR's persistently <60 mL/min signify possible Chronic Kidney Disease.    Anion gap 9 5 - 15  Glucose, capillary     Status: Abnormal   Collection Time: 04/24/17  7:36 AM  Result Value Ref Range   Glucose-Capillary 178 (H) 65 - 99 mg/dL   Comment 1 Notify RN    Comment 2 Document in Chart     Recent Results (from the past 240 hour(s))  MRSA PCR Screening     Status: None   Collection Time: 04/14/2017  3:32 PM  Result Value Ref Range Status   MRSA by PCR NEGATIVE NEGATIVE Final    Comment:        The GeneXpert MRSA Assay (FDA approved for NASAL specimens only), is one component of a comprehensive MRSA colonization surveillance program. It is not intended to diagnose MRSA infection nor to guide or monitor treatment for MRSA infections.   Culture, expectorated sputum-assessment     Status: None   Collection Time: 04/12/2017  4:40 PM  Result Value Ref Range Status   Specimen Description SPU  Final   Special Requests SPU  Final   Sputum evaluation THIS SPECIMEN IS ACCEPTABLE FOR SPUTUM CULTURE  Final   Report Status 04/23/2017 FINAL  Final  Culture, respiratory (NON-Expectorated)     Status: None  (Preliminary result)   Collection Time: 04/23/2017  4:40 PM  Result Value Ref Range Status   Specimen Description SPU  Final   Special Requests SPU Reflexed from [id number]  Final   Gram Stain   Final    FEW WBC PRESENT, PREDOMINANTLY PMN RARE SQUAMOUS EPITHELIAL CELLS PRESENT MODERATE GRAM POSITIVE COCCI IN CHAINS MODERATE BUDDING YEAST SEEN FEW GRAM NEGATIVE RODS Performed at [facility], [place] [address], [place], [place] [address]    Culture PENDING  Incomplete   Report Status PENDING  Incomplete  Culture, blood (Routine X 2) w Reflex to ID Panel     Status: None (Preliminary result)   Collection Time: 04/23/2017  6:58 PM  Result Value Ref Range Status   Specimen Description BLOOD RIGHT ARM  Final   Special Requests   Final    BOTTLES DRAWN AEROBIC AND ANAEROBIC Blood Culture adequate volume   Culture  Setup Time   Final    GRAM POSITIVE COCCI AEB BOTTLE Gram Stain Report Called to,Read Back By and Verified With: JAMIE WHITE, RN @ [place] @ 1600 ON 11.20.18 BY BOWMAN,L CRITICAL RESULT CALLED TO, READ BACK BY AND VERIFIED WITH: [place] [id number] MLM  Performed at [facility], [place] [address], [place], [place] [address]    Culture GRAM POSITIVE COCCI  Final   Report Status PENDING  Incomplete  Blood Culture ID Panel (Reflexed)     Status: Abnormal   Collection Time: 05/02/2017  6:58 PM  Result Value Ref Range Status   Enterococcus species NOT DETECTED NOT DETECTED Final   Listeria monocytogenes NOT DETECTED NOT DETECTED Final   Staphylococcus species DETECTED (A) NOT DETECTED Final    Comment: Methicillin (oxacillin) susceptible coagulase negative staphylococcus. Possible blood culture contaminant (unless isolated from more than one blood culture draw or clinical case suggests pathogenicity). No antibiotic treatment is indicated for blood  culture contaminants. CRITICAL RESULT CALLED TO, READ BACK BY AND VERIFIED WITH: PHARMD V BRYK [id number] MLM    Staphylococcus  aureus NOT DETECTED NOT DETECTED Final   Methicillin resistance NOT DETECTED NOT DETECTED Final   Streptococcus species NOT DETECTED NOT DETECTED Final   Streptococcus agalactiae NOT DETECTED NOT DETECTED Final   Streptococcus pneumoniae NOT DETECTED NOT DETECTED Final   Streptococcus pyogenes NOT DETECTED NOT DETECTED Final   Acinetobacter baumannii NOT DETECTED NOT DETECTED Final   Enterobacteriaceae species NOT DETECTED NOT DETECTED Final   Enterobacter cloacae complex NOT DETECTED NOT DETECTED Final   Escherichia coli NOT DETECTED NOT DETECTED Final   Klebsiella oxytoca NOT DETECTED NOT DETECTED Final   Klebsiella pneumoniae NOT DETECTED NOT DETECTED Final   Proteus species NOT DETECTED NOT DETECTED Final   Serratia marcescens NOT DETECTED NOT DETECTED Final   Haemophilus influenzae NOT DETECTED NOT DETECTED Final   Neisseria meningitidis NOT DETECTED NOT DETECTED Final   Pseudomonas aeruginosa NOT DETECTED NOT DETECTED Final   Candida albicans NOT DETECTED NOT DETECTED Final   Candida glabrata NOT DETECTED NOT DETECTED Final   Candida krusei NOT DETECTED NOT DETECTED Final   Candida parapsilosis NOT DETECTED NOT DETECTED Final   Candida tropicalis NOT DETECTED NOT DETECTED Final    Comment: Performed at [facility], [address] [place],  [address]  Culture, blood (Routine X 2) w Reflex to ID Panel     Status: None (Preliminary result)   Collection Time: 04/15/2017  6:59 PM  Result Value Ref Range Status   Specimen Description BLOOD LEFT ARM  Final   Special Requests   Final    BOTTLES DRAWN AEROBIC AND ANAEROBIC Blood Culture adequate volume   Culture NO GROWTH 2 DAYS  Final   Report Status PENDING  Incomplete    Lipid Panel Recent Labs    04/23/17 1853  CHOL 125  TRIG 154*  HDL 36*  CHOLHDL 3.5  VLDL 31  LDLCALC 58    Studies/Results: Ct Head Wo Contrast  Addendum Date: 04/23/2017   ADDENDUM REPORT: 04/23/2017 16:35 ADDENDUM: Critical  Value/emergent results were called by telephone at the time of interpretation on 04/23/2017 at 4:34 pm to Dr. Marolyn Hammock, who verbally acknowledged these results. Electronically Signed   By: Titus Dubin M.D.   On: 04/23/2017 16:35   Result Date: 04/23/2017 CLINICAL DATA:  Altered mental status. EXAM: CT HEAD WITHOUT CONTRAST TECHNIQUE: Contiguous axial images were obtained from the base of the skull through the vertex without intravenous contrast. COMPARISON:  CT head from yesterday. FINDINGS: Brain: New hypodensity and loss of the gray-white matter differentiation in the right insula and operculum. New focal hypodensity within the left internal capsule. No hemorrhage, hydrocephalus, extra-axial collection, or mass lesion/mass effect. Vascular: No hyperdense vessel or  unexpected calcification. Skull: Normal. Negative for fracture or focal lesion. Sinuses/Orbits: Unchanged air-fluid levels within the bilateral paranasal sinuses. The mastoid air cells are clear. Other: Partially visualized endotracheal and nasogastric tubes. IMPRESSION: 1. New hypodensity and loss of gray-white matter differentiation in the right insula and operculum, concerning for acute infarct. Additional focal hypodensity within the left internal capsule is also suspicious for infarct. These could be related to hypoperfusion given the clinical history of cardiac arrest, however they are not the typical watershed infarct zones. 2. Unchanged acute pansinusitis. Radiology assistant personnel have been notified to put me in telephone contact with the referring physician or the referring physician's clinical representative in order to discuss these findings. Once this communication is established I will issue an addendum to this report for documentation purposes. Electronically Signed: By: Titus Dubin M.D. On: 04/23/2017 16:29   Ct Head Wo Contrast  Result Date: 04/05/2017 CLINICAL DATA:  Seizure. Head trauma, minor, GCS>=13, high  clinical risk, initial exam EXAM: CT HEAD WITHOUT CONTRAST TECHNIQUE: Contiguous axial images were obtained from the base of the skull through the vertex without intravenous contrast. COMPARISON:  10/28/2014 FINDINGS: Brain: No evidence of acute infarction, hemorrhage, hydrocephalus, extra-axial collection, or mass lesion/mass effect. Stable mild to moderate diffuse cerebral atrophy. Vascular:  No hyperdense vessel or other acute findings. Skull: No evidence of fracture or other significant bone abnormality. Sinuses/Orbits: Air-fluid levels seen throughout the visualized paranasal sinuses, consistent with acute sinusitis. Other: None. IMPRESSION: No acute intracranial abnormality.  Stable cerebral atrophy. Acute pansinusitis. Electronically Signed   By: Earle Gell M.D.   On: 05/01/2017 11:13   Dg Chest Port 1 View  Result Date: 04/23/2017 CLINICAL DATA:  Hypoxia EXAM: PORTABLE CHEST 1 VIEW COMPARISON:  April 22, 2017 FINDINGS: Endotracheal tube tip is 4.2 cm above the carina. Central catheter tip is in the superior vena cava. Nasogastric tube tip and side port are below the diaphragm. No pneumothorax. There has been considerable clearing of airspace consolidation from the left lung compared to 1 day prior. There is patchy alveolar opacity throughout both lungs currently. There is no new opacity. Heart size and pulmonary vascularity are normal. No adenopathy. No bone lesions. IMPRESSION: One shadow positions as described without pneumothorax. Multifocal airspace opacity, likely pneumonia, with considerably less consolidation on the left compared to 1 day prior. There may be a degree of superimposed noncardiogenic edema and/or ARDS. More than one of these entities may exist concurrently. Electronically Signed   By: Lowella Grip III M.D.   On: 04/23/2017 15:02   Dg Chest Portable 1 View  Result Date: 04/10/2017 CLINICAL DATA:  Seizure-like activity. Blood around the mouth. Combative. The patient  underwent CPR and is now intubated. EXAM: PORTABLE CHEST 1 VIEW COMPARISON:  Chest x-ray of April 19, 2004 FINDINGS: The lungs are reasonably well inflated. The interstitial markings are increased. There is no pneumothorax or pleural effusion. The heart is normal in size. The pulmonary vascularity is indistinct. The endotracheal tube lies 4 cm above the carina. The esophagogastric tube tip and proximal port lie below the GE junction. The observed bony thorax is unremarkable. IMPRESSION: Borderline hypoinflation. Mild central pulmonary vascular congestion. No alveolar pneumonia. The endotracheal tube and esophagogastric tube are in reasonable position. Electronically Signed   By: David  Martinique M.D.   On: 04/05/2017 10:00   Dg Chest Port 1v Same Day  Result Date: 04/23/2017 CLINICAL DATA:  Initial evaluation for acute respiratory failure. EXAM: PORTABLE CHEST 1 VIEW COMPARISON:  Prior radiograph  from earlier the same day. FINDINGS: Endotracheal tube in place with tip positioned 4.2 cm above the carina. Enteric tube courses in the the abdomen. Right-sided PICC catheter in place with tip overlying the distal SVC. Cardiac and mediastinal silhouettes are partially obscured, but grossly stable. Interval development of extensive multifocal bilateral airspace opacities, left greater than right, which may reflect progressive infiltrates and/ or asymmetric edema. Suspected left pleural effusion. No pneumothorax. No acute osseous abnormality. IMPRESSION: 1. Interval development of extensive bilateral airspace opacities, left worse than right. Given the fairly rapid development of these findings, asymmetric pulmonary edema is favored. Rapidly progressive infiltrates and/or aspiration could also be considered. 2. Suspected left pleural effusion. Electronically Signed   By: Jeannine Boga M.D.   On: 04/23/2017 00:09    Medications:  Scheduled: . atorvastatin  40 mg Per Tube q1800  . chlorhexidine gluconate  (MEDLINE KIT)  15 mL Mouth Rinse BID  . heparin subcutaneous  5,000 Units Subcutaneous Q12H  . mouth rinse  15 mL Mouth Rinse 10 times per day  . [START ON 04/25/2017] methylPREDNISolone (SOLU-MEDROL) injection  40 mg Intravenous Daily  . pantoprazole (PROTONIX) IV  40 mg Intravenous Q24H   Continuous: . insulin (NOVOLIN-R) infusion 1.8 Units/hr (04/24/17 1710)  . levETIRAcetam Stopped (04/24/17 1158)  . piperacillin-tazobactam (ZOSYN)  IV    . dialysis replacement fluid (prismasate)    . dialysis replacement fluid (prismasate)    . dialysate (PRISMASATE)    . sodium chloride      Assessment: 46 y.o. male initially presenting to OSH with status epilepticus in the setting of hyperosmolar hyperglycemic state  1. I agree with Dr. Freddie Apley assessment that this may be a case of provoked seizures due to hyperglycemia. I agree that given the severe complications, the patient should be maintained on long-term antiseizure medications. 2. Intial CT head was negative for acute abnormality. Follow up CT head showed new hypodensity and loss of gray-white matter differentiation in the right insula and operculum, concerning for acute infarct. Additional focal hypodensity within the left internal capsule is also suspicious for infarct. These could be related to hypoperfusion given the clinical history of cardiac arrest, however they are not the typical watershed infarct zones. Early/evolving radiologic manifestations of diffuse hypoxic brain injury is also on the DDx.  3. Subsequent MRI brain reveals Extensive areas of acute/subacute nonhemorrhagic infarct throughout the brain with involvement of the globus pallidus bilaterally. Some of this reflects the sequela of a profound diffuse anoxic injury. The more peripheral scattered punctate cortical and cerebellar signal abnormality is atypical of hypoxia per Radiology report, but based upon my review of the images are compatible with cytotoxic cortical edema  secondary to hypoxic ischemic injury or, less likely, prolonged seizure activity. Also noted is a confluent focus of restricted diffusion and signal abnormality within the anteromedial right temporal lobe, which also militates in favor of hypoxic injury versus cytotoxic edema secondary to prolonged seizure. Minimal susceptibility over the high left frontal convexity may represent some petechial hemorrhage. 4. Based upon his examination findings as well as the MRI results, diffuse anoxic brain injury from the brief code at the outside ER is the highest component of the DDx. Slow metabolism of sedating meds is also possibly playing a role, but this is felt to be significantly less likely. Coma with depressed brainstem reflexes would not generally be expected in a postictal state, even following status epilepticus.  4. EEG: This is a very low voltage recording off sedation.  Background frequencies  are in the 3-4 Hz range and symmetrical. The patient has no response to stimulation and EEG shows no reactivity to stimulation. There are no epileptiform discharges or electrographic seizures. The severe generalized slowing of brain activity is non-specific but may be due to global anoxic brain injury, toxic, metabolic, or infectious etiologies.  The patient is not in non-convulsive status epilepticus.  Recommendations: 1. Continue Keppra 2. Frequent neuro checks 3. Seizure precautions 4. Poor prognosis for a meaningful neurological recovery has increased in likelihood relative to yesterday's assessment, given minimal clinical improvement and the MRI findings.     35 minutes spent in the neurological evaluation and management of this critically ill patient, including coordination of care.    LOS: 2 days   @Electronically  signed: Dr. Kerney Elbe 04/24/2017  7:53 AM

## 2017-04-24 NOTE — Progress Notes (Signed)
Pharmacy called back, as CRRT fluids are still not on the unit.  They reported they were delivered. They had delivered them to the wrong unit. They have since been found and will be hung STAT

## 2017-04-24 NOTE — Procedures (Signed)
Central Venous Catheter Insertion Procedure Note Ricardo HighlandJohn W Yerby [id number] 09/19/1970  Procedure: Insertion of Central Venous Catheter Indications: cvvhd / hd  Procedure Details Consent: Risks of procedure as well as the alternatives and risks of each were explained to the (patient/caregiver).  Consent for procedure obtained. Time Out: Verified patient identification, verified procedure, site/side was marked, verified correct patient position, special equipment/implants available, medications/allergies/relevent history reviewed, required imaging and test results available.  Performed  Maximum sterile technique was used including antiseptics, cap, gloves, gown, hand hygiene, mask and sheet. Skin prep: Chlorhexidine; local anesthetic administered A antimicrobial bonded/coated triple lumen catheter was placed in the right internal jugular vein using the Seldinger technique.  Evaluation Blood flow good Complications: No apparent complications Patient did tolerate procedure well. Chest X-ray ordered to verify placement.  CXR: pending.  Nelda BucksFEINSTEIN,DANIEL J. 04/24/2017, 7:13 PM  US  Mcarthur Rossettianiel J. Tyson AliasFeinstein, MD, FACP Pgr: [phone] [facility]

## 2017-04-24 NOTE — Progress Notes (Signed)
Initial Nutrition Assessment  INTERVENTION:   If desired recommend: Vital High Protein @ 40 ml/hr (960 ml/day) 60 ml Prostat TID Provides: 1560 kcal, 174 grams protein, and 802 ml free water.   NUTRITION DIAGNOSIS:   Inadequate oral intake related to inability to eat as evidenced by NPO status.  GOAL:   Provide needs based on ASPEN/SCCM guidelines  MONITOR:   I & O's, Vent status  REASON FOR ASSESSMENT:   Ventilator    ASSESSMENT:   Pt with PMH of HTN, DM, Sz d/o admitted after seizure and fall at home, brief CPR, large emesis with possible aspiration.    Per MD acute hypoxic respiratory failure following seizure/emesis, aspiration pneumonitis r/o pneumonia/ARDS. AKI on CKD. Renal consult pending MRI results. Per MD notes this afternoon, no CPR but continue medical care for now and observe 48 hrs.  Pt out of room at time of visit.  Noted weight 200 -260 lb, unsure of true weight. Noted generalized edema.   Patient is currently intubated on ventilator support  Medications reviewed and include: novolog, lantus, solumedrol Labs reviewed: K+ 5.5 (H) CBG's: 143-178   Lab Results  Component Value Date   HGBA1C 9.4 (H) 04/23/2017     NUTRITION - FOCUSED PHYSICAL EXAM:  Unable to complete, pt out of room  Diet Order:  Diet NPO time specified  EDUCATION NEEDS:   No education needs have been identified at this time  Skin:  Skin Assessment: Reviewed RN Assessment  Last BM:  unknown  Height:   Ht Readings from Last 1 Encounters:  04/23/17 5\' 10"  (1.778 m)    Weight:   Wt Readings from Last 1 Encounters:  04/24/17 263 lb 0.1 oz (119.3 kg)    Ideal Body Weight:  75.4 kg  BMI:  Body mass index is 37.74 kg/m.  Estimated Nutritional Needs:   Kcal:  1300-1700  Protein:  > 150 grams  Fluid:  > 2 L/day  Kendell BaneHeather Meliya Mcconahy RD, LDN, CNSC [phone] Pager [phone] After Hours Pager

## 2017-04-24 NOTE — Progress Notes (Addendum)
I have had extensive discussions with family mom. We discussed patients current circumstances and organ failures. We also discussed patient's prior wishes under circumstances such as this. Family has decided to NOT perform resuscitation if arrest but to continue current medical support for now. Neuro reviewed MRI with me., observe 48 hours, prognosis not clear but concerning I rviewed MRI with rads and neuro, re assessed pt pre HD Will hold off A line, repeat abg Ccm time now 85 min in total  Mcarthur Rossettianiel J. Tyson AliasFeinstein, MD, FACP Pgr: [phone] [facility]

## 2017-04-24 NOTE — Progress Notes (Addendum)
Pharmacy called in regards to CRRT fluids, [facility] had to be [facility] from [facility] (per pharmacy) and they are waiting on the delivery. Will follow up soon.

## 2017-04-24 NOTE — Progress Notes (Signed)
Pharmacy Antibiotic Note  Ricardo HighlandJohn W Rogers is a 46 y.o. male admitted on 04/06/2017 with seizures - with concern for PNA. Originally started on Unasyn however broadened to Zosyn due to fever. Pharmacy consulted to dose.  -CRRT started 11/21   Plan: -Change Zosyn to 3.375gm IV q6h -Will continue to follow renal function, culture results, LOT, and antibiotic de-escalation plans   Height: 5\' 10"  (177.8 cm) Weight: 263 lb 0.1 oz (119.3 kg) IBW/kg (Calculated) : 73  Temp (24hrs), Avg:99.7 F (37.6 C), Min:99.1 F (37.3 C), Max:100.3 F (37.9 C)  Recent Labs  Lab 04/30/2017 0908  05/03/2017 1242 04/23/2017 1859  04/23/17 0201 04/23/17 0623 04/23/17 1523 04/23/17 2315 04/24/17 0432  WBC 18.9*  --   --   --   --  18.6*  --   --   --  17.1*  CREATININE 1.54*   < >  --   --    < > 3.87* 4.66* 5.30* 6.96* 7.71*  LATICACIDVEN  --   --  2.0* 5.0*  --   --   --  1.7 1.6  --    < > = values in this interval not displayed.    Estimated Creatinine Clearance: 15.2 mL/min (A) (by C-G formula based on SCr of 7.71 mg/dL (H)).    No Known Allergies  Antimicrobials this admission: Unasyn 11/20 x 1 Zosyn 11/20 >>  Dose adjustments this admission: n/a  Microbiology results: 11/19 blood x2- CONS 1/2 11/19 MRSA PCR >> neg 11/19 RCx >> GPC/chains, yeast, GNR- non predominant  Thank you for allowing pharmacy to be a part of this patient's care.  Harland GermanAndrew Kathlynn Rogers, Pharm D 04/24/2017 3:26 PM

## 2017-04-24 NOTE — Progress Notes (Signed)
I reported elevated potassium to CCM. New BMP order for 9am.

## 2017-04-24 NOTE — Progress Notes (Signed)
CCM informed of pt having 7 consecutive blood glucose checks less than 180 and aspart insulin gtt is 3.5 at this time. Physician informed that pt received lantus 15 units yesterday per protocol. Physician informed of pts labs and urinary output of 75 for this shift thus far. Physician is reviewing pts chart. Will continue to monitor pt and look for new orders.

## 2017-04-24 NOTE — Progress Notes (Signed)
PULMONARY / CRITICAL CARE MEDICINE   Name: Ricardo Rogers MRN: [id number] DOB: [birth year]    ADMISSION DATE:  04-30-2017 CONSULTATION DATE:  04/23/2017  REFERRING MD:  Jeani Hawking  CHIEF COMPLAINT:  Seizure  HISTORY OF PRESENT ILLNESS:  HPI obtained from medical chart review and from patient's mother at the bedside.     46 year old male with past medical history of seizures (not on AED), hypertension, diabetes, and childhood asthma who presented to [facility] on 11/19 by EMS with reported seizure like activity.  Mother witnessed seizure at home following fall from standing position in kitchen, unsure any head trauma.   Patient initially combative/ post-ictal in ER and then had another witnessed seizure followed by bradycardia into the 30's.  CPR briefly done, possibly 3 mins, and then intubated for airway protection.  During intubation, patient vomited large amounts of liquid like grape juice (patient drinks grape soda frequently at home) despite RSI with paralytics with probable aspiration.  Patient did require sedation afterwards for movements.  Initial glucose 498 up to 764 in ER.  Reportedly, last seizure was 1 year ago in the setting of hyperglycemia; no prior seizure history.  Patient is not compliant with any of his medications or diet.  Mother denies any recent illness or complaints from patient.  Patient admitted to ICU at [facility] in lack of ICU beds at [facility].  Placed continuous sedation, glucose stabilizer and neurology consulted.  Patient with hypotension requiring fluid boluses and placement of PICC line for poor venous access.   Patient loaded with keppra and seizures thought attributed to hyperglycemia.  CXR with progressive worsening of bilateral airspace disease after aspirating requiring increased PEEP and FiO2 on mechanical ventilation.  Started on empiric Unasyn.  Overnight, he developed fever, oliguira, and required vasopressor support.  Patient subsequently transferred to [facility]  today with available ICU bed, PCCM to accept and manage.   SUBJECTIVE:  No sedation since around 1345 yesterday Off pressors Per RN, no change in mental status EEG with diffuse slowing Afebrile CVP 14 Worsening renal function AM labs/ CXR/ ABG pending  VITAL SIGNS: BP (!) 138/95   Pulse (!) 117   Temp 99.4 F (37.4 C) (Axillary)   Resp (!) 25   Ht 5\' 10"  (1.778 m)   Wt 263 lb 0.1 oz (119.3 kg)   SpO2 98%   BMI 37.74 kg/m   HEMODYNAMICS: CVP:  [12 mmHg-17 mmHg] 14 mmHg  VENTILATOR SETTINGS: Vent Mode: PRVC FiO2 (%):  [70 %-90 %] 70 % Set Rate:  [14 bmp-25 bmp] 20 bmp Vt Set:  [440 mL-550 mL] 550 mL PEEP:  [10 cmH20] 10 cmH20 Plateau Pressure:  [15 cmH20-28 cmH20] 27 cmH20  INTAKE / OUTPUT: I/O last 3 completed shifts: In: 4472.9 [I.V.:3672.9; IV Piggyback:800] Out: 175 [Urine:175]  PHYSICAL EXAMINATION: General:  Critically ill appearing male on MV in NAD HEENT: MM pink/moist, ETT/ L NGT, pupils 4/very sluggish, brisk left corneal, right sluggish corneal Neuro: Only stimulus to pain is increase in breathing, otherwise flaccid, no gag or cough CV: ST 118, no m/r/g PULM: even/non-labored on MV, lungs bilaterally coarse, scattered rhonchi, LLL crackles, thick yellow secretions, plt pressure 27, PIP 30, no wheeze GI: obese, soft, hypo BS, foley Extremities: warm/dry, no edema  Skin: no rashes   LABS:  BMET Recent Labs  Lab 04/23/17 1523 04/23/17 2315 04/24/17 0432  NA 134* 139 140  K 5.4* 5.0 5.5*  CL 107 110 111  CO2 18* 20* 20*  BUN 29* 39* 45*  CREATININE 5.30* 6.96* 7.71*  GLUCOSE 697* 142* 142*    Electrolytes Recent Labs  Lab 04/23/17 1523 04/23/17 2315 04/24/17 0432  CALCIUM 6.1* 6.9* 7.1*  MG 1.3*  --   --   PHOS 4.5  --   --     CBC Recent Labs  Lab 04/06/2017 0908 04/24/2017 0911 04/23/17 0201 04/24/17 0432  WBC 18.9*  --  18.6* 17.1*  HGB 15.5 16.3 13.4 11.8*  HCT 44.7 48.0 40.9 34.7*  PLT 296  --  210 192    Coag's No  results for input(s): APTT, INR in the last 168 hours.  Sepsis Markers Recent Labs  Lab 04/17/2017 1859 04/23/17 1523 04/23/17 2315 04/24/17 0432  LATICACIDVEN 5.0* 1.7 1.6  --   PROCALCITON  --  >150.00  --  >150.00    ABG Recent Labs  Lab 04/23/17 0120 04/23/17 1437 04/23/17 1704  PHART 7.218* 7.141* 7.204*  PCO2ART 58.0* 66.2* 53.4*  PO2ART 96.0 94.0 102.0    Liver Enzymes Recent Labs  Lab 04/12/2017 0908 04/23/17 1523 04/23/17 2315 04/24/17 0432  AST 25  --  531* 580*  ALT 26  --  264* 294*  ALKPHOS 114  --  59 67  BILITOT 0.8  --  1.0 1.0  ALBUMIN 4.0 2.1* 2.3* 2.2*    Cardiac Enzymes Recent Labs  Lab 04/26/2017 0911  TROPONINI <0.03    Glucose Recent Labs  Lab 04/23/17 2306 04/23/17 2352 04/24/17 0054 04/24/17 0157 04/24/17 0255 04/24/17 0736  GLUCAP 157* 142* 139* 137* 143* 178*    Imaging Ct Head Wo Contrast  Addendum Date: 04/23/2017   ADDENDUM REPORT: 04/23/2017 16:35 ADDENDUM: Critical Value/emergent results were called by telephone at the time of interpretation on 04/23/2017 at 4:34 pm to Dr. Warren LacyWalter Gray, who verbally acknowledged these results. Electronically Signed   By: Obie DredgeWilliam T Derry M.D.   On: 04/23/2017 16:35   Result Date: 04/23/2017 CLINICAL DATA:  Altered mental status. EXAM: CT HEAD WITHOUT CONTRAST TECHNIQUE: Contiguous axial images were obtained from the base of the skull through the vertex without intravenous contrast. COMPARISON:  CT head from yesterday. FINDINGS: Brain: New hypodensity and loss of the gray-white matter differentiation in the right insula and operculum. New focal hypodensity within the left internal capsule. No hemorrhage, hydrocephalus, extra-axial collection, or mass lesion/mass effect. Vascular: No hyperdense vessel or unexpected calcification. Skull: Normal. Negative for fracture or focal lesion. Sinuses/Orbits: Unchanged air-fluid levels within the bilateral paranasal sinuses. The mastoid air cells are clear.  Other: Partially visualized endotracheal and nasogastric tubes. IMPRESSION: 1. New hypodensity and loss of gray-white matter differentiation in the right insula and operculum, concerning for acute infarct. Additional focal hypodensity within the left internal capsule is also suspicious for infarct. These could be related to hypoperfusion given the clinical history of cardiac arrest, however they are not the typical watershed infarct zones. 2. Unchanged acute pansinusitis. Radiology assistant personnel have been notified to put me in telephone contact with the referring physician or the referring physician's clinical representative in order to discuss these findings. Once this communication is established I will issue an addendum to this report for documentation purposes. Electronically Signed: By: Obie DredgeWilliam T Derry M.D. On: 04/23/2017 16:29   Dg Chest Port 1 View  Result Date: 04/23/2017 CLINICAL DATA:  Hypoxia EXAM: PORTABLE CHEST 1 VIEW COMPARISON:  April 22, 2017 FINDINGS: Endotracheal tube tip is 4.2 cm above the carina. Central catheter tip is in the superior vena cava. Nasogastric tube  tip and side port are below the diaphragm. No pneumothorax. There has been considerable clearing of airspace consolidation from the left lung compared to 1 day prior. There is patchy alveolar opacity throughout both lungs currently. There is no new opacity. Heart size and pulmonary vascularity are normal. No adenopathy. No bone lesions. IMPRESSION: One shadow positions as described without pneumothorax. Multifocal airspace opacity, likely pneumonia, with considerably less consolidation on the left compared to 1 day prior. There may be a degree of superimposed noncardiogenic edema and/or ARDS. More than one of these entities may exist concurrently. Electronically Signed   By: Bretta BangWilliam  Woodruff III M.D.   On: 04/23/2017 15:02   STUDIES:  CT head 11/19 >> no acute intracranial abnormality; stable cerebral atrophy; acute  pansinusitis  CT head 11/20 >> New hypodensity and loss of gray-white matter differentiation in the right insula and operculum, concerning for acute infarct.  Additional focal hypodensity within the left internal capsule is also suspicious for infarct. These could be related to hypoperfusion given the clinical history of cardiac arrest, however they are not the typical watershed infarct zones.  Unchanged acute pansinusitis.  CULTURES: MRSA 11/19>> neg Sputum Cx 11/19 >> BC x 2 11/19 >>  ANTIBIOTICS: 11/20 Unasyn >>11/20 11/20 Vanc>> 11/20 Zosyn >>  SIGNIFICANT EVENTS: 11/19  Admit to [facility] 11/20  Transfer to [facility]  LINES/TUBES: 11/19 ETT 11/19 L NGT 11/19 R PICC >>  DISCUSSION: [age] yoM w/PMH uncontrolled HTN and diabetes and hx of seizure (seizures associated w/ hyperglycemia) with witnessed seizures that became bradycardic in ER while postictal, requiring brief CPR, vomiting during intubation, with progressive renal failure, hypotension, worsening infiltrates.    ASSESSMENT / PLAN:  PULMONARY A: Acute hypoxic respiratory failure following seizure/ emesis Aspiration pneumonitis r/o pneumonia/ ARDS Hx Asthma - CXR 11/19 with development of bilateral opacities L > R P:   Full MV  Defer SBT for now given mental status Trend CXR and ABG  VAP protocol See ID BD prn  Decrease solumedrol to 40mg  daily   CARDIOVASCULAR A:  Shock- presumed septic vs related to sedation Brief CPR- ?3mins following bradycardia/ seizure P:  Tele monitoring Levophed for goal MAP > 65 Lactate wnl TTE ordered  Trend CVP- elevated, ideally needs diuresis but hold given renal fxn co-ox 89.5 , cortisol 14.1  RENAL A:   AKI on CKD (previous sCr 1.46 in 5/16)- oliguria w/ CVP of 16 Hyperkalemia- stable, no QRS changes P:   FENa 0.96% suggest pre-renal etiology Needs Nephrology consult.  Will wait for MRI results as there may not be anything to offer given poor neuro exam Mg 1.2- will replete with  2gm x 1  Repeat BMET at 9 am Trend BMP / mg/ phos/ urinary output/ daily weights  Consider renal ultrasound  GASTROINTESTINAL A:   NPO Transaminitis - possibly shock liver  P:   NPO Continue NGT- current abx will treat pansinuitis but ideally, OGT would be better Start TF PPI for SUP Trend LFTs  HEMATOLOGIC A:   Leukocytosis  P:  Trend CBC SCDs/ heparin for VTE ppx   INFECTIOUS A:   Aspiration pneumonia/ pneumonitis  - PCT > 150 -  UA neg P:   Continue broad spectrum abx- vanc/ zosyn Follow blood and sputum cx Trend WBC/ fever Hold tylenol given elevated LFTs Trend PCT   ENDOCRINE A:   Uncontrolled DM, ?possible HHNK P:   CBG q 4  SSI and lantus 30 units daily TSH 1.292 HgbA1c  9.4  NEUROLOGIC A:  Acute encephalopathy - possibly multifactorial- ddx anoxic injury vs toxic (not clearing previous sedation with AKI) vs CVA  - CT head 11/20 concerning for acute CVA vs hypoxic injury, pattern not typical for watershed infarct - EEG 11/20 neg for epileptiform discharges; severe generalized slowing pattern Seizure- hx of, on no home meds P:   RASS goal: 0 Hold sedation  Appreciate Neurology input MRI ordered Seizure precautions AED per Neuro Guarded prognosis given poor neuro exam   FAMILY  - Updates: Mother and sister updated at bedside 11/20.  No family at bedside 11/21.  - Inter-disciplinary family meet or Palliative Care meeting due by:  04/30/2017  Posey Boyer, AGACNP- [facility] Pager: [phone]  04/24/2017, 7:48 AM  STAFF NOTE: I, Rory Percy, MD FACP have personally reviewed patient's available data, including medical history, events of note, physical examination and test results as part of my evaluation. I have discussed with resident/NP and other care providers such as pharmacist, RN and RRT. In addition, I personally evaluated patient and elicited key findings of: not awake,  corneal's intact, no cough, no gag, perrl, et, jvd, lung sounds ronchi, abdo soft, mild distention, no rash, no edema, pcxr which I reviewed rapidly progressive bilateral infiltrates, ett wnl, picc wnl, would maintain plat less 30, ABg last reviewed, increase MV firther, repeat abg, wil need a line I will place, abg to follow, pao2 looks like it will allow reduction peep 10/ fio2, cvp 16 does not explain infiltrates, acute on chronic renal failure, consider early renal replacement, consider lasix challenge, pct impressive, maintain aggressive empiric asp coverage, feeding after MRI, ppi, for MRI brain, eeg done and reviewed, avoid fevers , insulin drip off The patient is critically ill with multiple organ systems failure and requires high complexity decision making for assessment and support, frequent evaluation and titration of therapies, application of advanced monitoring technologies and extensive interpretation of multiple databases.   Critical Care Time devoted to patient care services described in this note is 35 Minutes. This time reflects time of care of this signee: Rory Percy, MD FACP. This critical care time does not reflect procedure time, or teaching time or supervisory time of PA/NP/Med student/Med Resident etc but could involve care discussion time. Rest per NP/medical resident whose note is outlined above and that I agree with   Mcarthur Rossetti. Tyson Alias, MD, FACP Pgr: [phone] [facility] 04/24/2017 9:18 AM

## 2017-04-25 DIAGNOSIS — G931 Anoxic brain damage, not elsewhere classified: Secondary | ICD-10-CM

## 2017-04-25 LAB — GLUCOSE, CAPILLARY
GLUCOSE-CAPILLARY: 130 mg/dL — AB (ref 65–99)
GLUCOSE-CAPILLARY: 144 mg/dL — AB (ref 65–99)
GLUCOSE-CAPILLARY: 288 mg/dL — AB (ref 65–99)
GLUCOSE-CAPILLARY: 257 mg/dL — AB (ref 65–99)
GLUCOSE-CAPILLARY: 231 mg/dL — AB (ref 65–99)
GLUCOSE-CAPILLARY: 120 mg/dL — AB (ref 65–99)
GLUCOSE-CAPILLARY: 150 mg/dL — AB (ref 65–99)
GLUCOSE-CAPILLARY: 215 mg/dL — AB (ref 65–99)
Glucose-Capillary: 125 mg/dL — ABNORMAL HIGH (ref 65–99)
Glucose-Capillary: 129 mg/dL — ABNORMAL HIGH (ref 65–99)
Glucose-Capillary: 140 mg/dL — ABNORMAL HIGH (ref 65–99)
Glucose-Capillary: 157 mg/dL — ABNORMAL HIGH (ref 65–99)
Glucose-Capillary: 205 mg/dL — ABNORMAL HIGH (ref 65–99)
Glucose-Capillary: 240 mg/dL — ABNORMAL HIGH (ref 65–99)
Glucose-Capillary: 123 mg/dL — ABNORMAL HIGH (ref 65–99)
Glucose-Capillary: 117 mg/dL — ABNORMAL HIGH (ref 65–99)
Glucose-Capillary: 180 mg/dL — ABNORMAL HIGH (ref 65–99)
Glucose-Capillary: 204 mg/dL — ABNORMAL HIGH (ref 65–99)
Glucose-Capillary: 220 mg/dL — ABNORMAL HIGH (ref 65–99)

## 2017-04-25 LAB — POCT I-STAT 3, ART BLOOD GAS (G3+)
Acid-base deficit: 3 mmol/L — ABNORMAL HIGH (ref 0.0–2.0)
Bicarbonate: 20.8 mmol/L (ref 20.0–28.0)
O2 SAT: 99 %
PCO2 ART: 30.7 mmHg — AB (ref 32.0–48.0)
PH ART: 7.438 (ref 7.350–7.450)
TCO2: 22 mmol/L (ref 22–32)
pO2, Arterial: 136 mmHg — ABNORMAL HIGH (ref 83.0–108.0)

## 2017-04-25 LAB — RENAL FUNCTION PANEL
Albumin: 2.1 g/dL — ABNORMAL LOW (ref 3.5–5.0)
Anion gap: 12 (ref 5–15)
BUN: 57 mg/dL — AB (ref 6–20)
CHLORIDE: 108 mmol/L (ref 101–111)
CO2: 21 mmol/L — AB (ref 22–32)
CREATININE: 7.67 mg/dL — AB (ref 0.61–1.24)
Calcium: 7.7 mg/dL — ABNORMAL LOW (ref 8.9–10.3)
GFR calc Af Amer: 9 mL/min — ABNORMAL LOW (ref >60)
GFR, EST NON AFRICAN AMERICAN: 7 mL/min — AB (ref >60)
Glucose, Bld: 130 mg/dL — ABNORMAL HIGH (ref 65–99)
Phosphorus: 6.6 mg/dL — ABNORMAL HIGH (ref 2.5–4.6)
Potassium: 5.1 mmol/L (ref 3.5–5.1)
SODIUM: 141 mmol/L (ref 135–145)

## 2017-04-25 LAB — MAGNESIUM
MAGNESIUM: 2.5 mg/dL — AB (ref 1.7–2.4)
MAGNESIUM: 2.5 mg/dL — AB (ref 1.7–2.4)
MAGNESIUM: 2.4 mg/dL (ref 1.7–2.4)

## 2017-04-25 LAB — CBC
HCT: 38.9 % — ABNORMAL LOW (ref 39.0–52.0)
HEMOGLOBIN: 13.5 g/dL (ref 13.0–17.0)
MCH: 28.2 pg (ref 26.0–34.0)
MCHC: 34.7 g/dL (ref 30.0–36.0)
MCV: 81.2 fL (ref 78.0–100.0)
Platelets: 171 10*3/uL (ref 150–400)
RBC: 4.79 MIL/uL (ref 4.22–5.81)
RDW: 15.4 % (ref 11.5–15.5)
WBC: 14.2 10*3/uL — ABNORMAL HIGH (ref 4.0–10.5)

## 2017-04-25 LAB — CULTURE, BLOOD (ROUTINE X 2): Special Requests: ADEQUATE

## 2017-04-25 LAB — PROCALCITONIN: Procalcitonin: 132.07 ng/mL

## 2017-04-25 LAB — PHOSPHORUS
Phosphorus: 6.3 mg/dL — ABNORMAL HIGH (ref 2.5–4.6)
Phosphorus: 5.3 mg/dL — ABNORMAL HIGH (ref 2.5–4.6)

## 2017-04-25 MED ORDER — VITAL HIGH PROTEIN PO LIQD
1000.0000 mL | ORAL | Status: DC
  Administered 2017-04-25 – 2017-04-27 (×3): 1000 mL
  Filled 2017-04-25 (×3): qty 1000

## 2017-04-25 MED ORDER — PRO-STAT SUGAR FREE PO LIQD
30.0000 mL | Freq: Two times a day (BID) | ORAL | Status: DC
  Administered 2017-04-25 (×2): 30 mL
  Filled 2017-04-25 (×2): qty 30

## 2017-04-25 MED ORDER — METHYLPREDNISOLONE SODIUM SUCC 40 MG IJ SOLR
20.0000 mg | Freq: Every day | INTRAMUSCULAR | Status: DC
  Administered 2017-04-25: 20 mg via INTRAVENOUS
  Filled 2017-04-25: qty 1

## 2017-04-25 MED ORDER — INSULIN ASPART 100 UNIT/ML ~~LOC~~ SOLN
4.0000 [IU] | SUBCUTANEOUS | Status: DC
  Administered 2017-04-25 – 2017-04-28 (×21): 4 [IU] via SUBCUTANEOUS

## 2017-04-25 MED ORDER — DEXTROSE 10 % IV SOLN: INTRAVENOUS | Status: DC | PRN

## 2017-04-25 MED ORDER — INSULIN GLARGINE 100 UNIT/ML ~~LOC~~ SOLN
20.0000 [IU] | SUBCUTANEOUS | Status: DC
  Administered 2017-04-25: 20 [IU] via SUBCUTANEOUS
  Filled 2017-04-25 (×2): qty 0.2

## 2017-04-25 MED ORDER — INSULIN ASPART 100 UNIT/ML ~~LOC~~ SOLN
0.0000 [IU] | Freq: Three times a day (TID) | SUBCUTANEOUS | Status: DC
  Administered 2017-04-25: 7 [IU] via SUBCUTANEOUS
  Administered 2017-04-25: 3 [IU] via SUBCUTANEOUS

## 2017-04-25 MED ORDER — INSULIN ASPART 100 UNIT/ML ~~LOC~~ SOLN
0.0000 [IU] | SUBCUTANEOUS | Status: DC
  Administered 2017-04-25 – 2017-04-26 (×2): 7 [IU] via SUBCUTANEOUS
  Administered 2017-04-26 – 2017-04-28 (×12): 4 [IU] via SUBCUTANEOUS
  Administered 2017-04-28: 3 [IU] via SUBCUTANEOUS
  Administered 2017-04-28: 4 [IU] via SUBCUTANEOUS

## 2017-04-25 NOTE — Progress Notes (Signed)
PULMONARY / CRITICAL CARE MEDICINE   Name: Ricardo Rogers MRN: [id number] DOB: Jul 26, 1970    ADMISSION DATE:  07/20/2016 CONSULTATION DATE:  04/23/2017  REFERRING MD:  [name]  CHIEF COMPLAINT:  Seizure  HISTORY OF PRESENT ILLNESS:  HPI obtained from medical chart review and from patient's mother at the bedside.     46 year old male with past medical history of seizures (not on AED), hypertension, diabetes, and childhood asthma who [facility] by EMS with reported seizure like activity.  Mother witnessed seizure at home following fall from standing position in kitchen, unsure any head trauma.   Patient initially combative/ post-ictal in ER and then had another witnessed seizure followed by bradycardia into the 30's.  CPR briefly done, possibly 3 mins, and then intubated for airway protection.  During intubation, patient vomited large amounts of liquid like grape juice (patient drinks grape soda frequently at home) despite RSI with paralytics with probable aspiration.  Patient did require sedation afterwards for movements.  Initial glucose 498 up to 764 in ER.  Reportedly, last seizure was 1 year ago in the setting of hyperglycemia; no prior seizure history.  Patient is not compliant with any of his medications or diet.  Mother denies any recent illness or complaints from patient.  Patient admitted to ICU at [facility] in lack of ICU beds at [facility].  Placed continuous sedation, glucose stabilizer and neurology consulted.  Patient with hypotension requiring fluid boluses and placement of PICC line for poor venous access.   Patient loaded with keppra and seizures thought attributed to hyperglycemia.  CXR with progressive worsening of bilateral airspace disease after aspirating requiring increased PEEP and FiO2 on mechanical ventilation.  Started on empiric Unasyn.  Overnight, he developed fever, oliguira, and required vasopressor support.  Patient subsequently transferred to [facility]  today with available ICU bed, PCCM to accept and manage.   SUBJECTIVE:  cvvhd started  VITAL SIGNS: BP 107/76   Pulse 94   Temp 97.6 F (36.4 C) (Oral)   Resp (!) 30   Ht 5\' 10"  (1.778 m)   Wt 122.7 kg (270 lb 8.1 oz)   SpO2 100%   BMI 38.81 kg/m   HEMODYNAMICS: CVP:  [14 mmHg-19 mmHg] 14 mmHg  VENTILATOR SETTINGS: Vent Mode: PRVC FiO2 (%):  [50 %-70 %] 50 % Set Rate:  [20 bmp-30 bmp] 30 bmp Vt Set:  [510 mL-550 mL] 510 mL PEEP:  [10 cmH20] 10 cmH20 Plateau Pressure:  [24 cmH20-33 cmH20] 33 cmH20  INTAKE / OUTPUT: I/O last 3 completed shifts: In: 238.6 [I.V.:133.6; IV Piggyback:105] Out: 2446 [Urine:175; Emesis/NG output:250; Other:2021]  PHYSICAL EXAMINATION: General: mild movements Neuro: mild movements ext today, perrl, Not FC, no other changes HEENT: ett,jvd wnl PULM: CTA CV:  s1 s2 RRR no slight distant GI: soft, BS wnl, no r, no m Extremities: edema none   LABS:  BMET Recent Labs  Lab 04/23/17 2315 04/24/17 0432 04/25/17 0408  NA 139 140 141  K 5.0 5.5* 5.1  CL 110 111 108  CO2 20* 20* 21*  BUN 39* 45* 57*  CREATININE 6.96* 7.71* 7.67*  GLUCOSE 142* 142* 130*    Electrolytes Recent Labs  Lab 04/23/17 1523 04/23/17 2315 04/24/17 0432 04/25/17 0408  CALCIUM 6.1* 6.9* 7.1* 7.7*  MG 1.3*  --   --  2.5*  PHOS 4.5  --   --  6.6*    CBC Recent Labs  Lab 04/23/17 0201 04/24/17 0432 04/25/17  0408  WBC 18.6* 17.1* 14.2*  HGB 13.4 11.8* 13.5  HCT 40.9 34.7* 38.9*  PLT 210 192 171    Coag's Recent Labs  Lab 04/24/17 1000  APTT 32  INR 1.39    Sepsis Markers Recent Labs  Lab May 03, 2017 1859 04/23/17 1523 04/23/17 2315 04/24/17 0432 04/25/17 0408  LATICACIDVEN 5.0* 1.7 1.6  --   --   PROCALCITON  --  >150.00  --  >150.00 132.07    ABG Recent Labs  Lab 04/23/17 1437 04/23/17 1704 04/24/17 0825  PHART 7.141* 7.204* 7.244*  PCO2ART 66.2* 53.4* 38.0  PO2ART 94.0 102.0 136*    Liver Enzymes Recent Labs  Lab  03-May-2017 0908  04/23/17 2315 04/24/17 0432 04/25/17 0408  AST 25  --  531* 580*  --   ALT 26  --  264* 294*  --   ALKPHOS 114  --  59 67  --   BILITOT 0.8  --  1.0 1.0  --   ALBUMIN 4.0   < > 2.3* 2.2* 2.1*   < > = values in this interval not displayed.    Cardiac Enzymes Recent Labs  Lab 05/03/17 0911  TROPONINI <0.03    Glucose Recent Labs  Lab 04/25/17 0053 04/25/17 0156 04/25/17 0315 04/25/17 0414 04/25/17 0520 04/25/17 0556  GLUCAP 140* 129* 125* 120* 123* 117*    Imaging Mr Brain Wo Contrast  Result Date: 04/24/2017 CLINICAL DATA:  Encephalopathy. EXAM: MRI HEAD WITHOUT CONTRAST TECHNIQUE: Multiplanar, multiecho pulse sequences of the brain and surrounding structures were obtained without intravenous contrast. COMPARISON:  CT head without contrast 04/23/2017. FINDINGS: Brain: Diffusion-weighted images is demonstrate extensive acute/subacute infarction. There are confluent cortical nonhemorrhagic infarcts involving the right temporal lobe and frontal operculum extending into the sylvian fissure and along the central sulcus. There is involvement of the medial frontal lobes, right greater than left. More scattered areas of cortical infarction are present in the left hemisphere. There is diffuse involvement with globus pallidus bilaterally with profound restricted diffusion. A focal area of restricted diffusion is present laterally in the left thalamus and more centrally in the right thalamus. A single punctate focus of restricted diffusion is present in the anterior right pons. A remote lacunar infarct is present in the posteromedial right cerebellum measuring 14 mm. Other punctate foci are present in the cerebellum bilaterally. There is a focus in the right occipital lobe. These areas all demonstrate increased T2 signal compatible with a subacute time frame. There is some susceptibility in the high left frontal convexity which may represent some hemorrhage. Ventricles are of  normal size. No significant extra-axial fluid collection is present. Internal auditory canals are within normal limits. Vascular: Flow is present in the major intracranial arteries. Skull and upper cervical spine: The skullbase is within normal limits. Patient is intubated. NG tube is in place. Sinuses/Orbits: Fluid levels are present in the nasopharynx. There fluid levels in the sphenoid sinuses and maxillary sinuses bilaterally. Bilateral mastoid effusions are present. Globes and orbits are within normal limits. IMPRESSION: 1. 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 Some of this reflects the sequela of a profound diffuse anoxic injury. The more peripheral scattered punctate cortical and cerebellar ischemia is atypical of hypoxia. A central embolic source such as carotid artery disease or cardiac thrombus were septal perforation may be related with diffuse embolic disease. 2. Confluent focus of restricted diffusion and signal abnormality within the anteromedial right temporal lobe. This  may be secondary to seizure focus with source of seizure. 3. Minimal susceptibility over the high left frontal convexity may represent some petechial hemorrhage. 4. Extensive sinus disease related to intubation and NG tube. These results were called by telephone at the time of interpretation on 04/24/2017 at 3:21 pm to Dr. Tyson Alias, who verbally acknowledged these results. Electronically Signed   By: Marin Roberts M.D.   On: 04/24/2017 15:21   Dg Chest Port 1 View  Result Date: 04/24/2017 CLINICAL DATA:  Central line placement. EXAM: PORTABLE CHEST 1 VIEW COMPARISON:  Radiograph of same day. FINDINGS: Endotracheal and nasogastric tubes are unchanged in position. Right-sided PICC line is unchanged in position. Interval placement of right internal jugular dialysis catheter with distal tip in expected position of SVC. Stable  cardiomediastinal silhouette. Stable bilateral patchy airspace opacities are noted concerning for inflammation or edema. Bony thorax is unremarkable. IMPRESSION: Stable support apparatus. Interval placement of right internal jugular dialysis catheter with distal tip in expected position of the SVC. Stable bilateral lung opacities are noted. Electronically Signed   By: Lupita Raider, M.D.   On: 04/24/2017 15:54   Dg Chest Port 1 View  Result Date: 04/24/2017 CLINICAL DATA:  Intubated EXAM: PORTABLE CHEST 1 VIEW COMPARISON:  04/23/2017 FINDINGS: Support devices are stable. Multifocal airspace opacities are again noted, minimally improved compatible with multifocal pneumonia. Heart is normal size. No effusions. IMPRESSION: Slight improvement in multifocal bilateral airspace opacity/pneumonia. Electronically Signed   By: Charlett Nose M.D.   On: 04/24/2017 09:29   STUDIES:  CT head 11/19 >> no acute intracranial abnormality; stable cerebral atrophy; acute pansinusitis  CT head 11/20 >> New hypodensity and loss of gray-white matter differentiation in the right insula and operculum, concerning for acute infarct.  Additional focal hypodensity within the left internal capsule is also suspicious for infarct. These could be related to hypoperfusion given the clinical history of cardiac arrest, however they are not the typical watershed infarct zones.  Unchanged acute pansinusitis. MRi 11/21>>>Extensive areas of acute/subacute nonhemorrhagic infarct throughout the brain with involvement of the globus pallidus bilaterally. Some of this reflects the sequela of a profound diffuse anoxic injury. The more peripheral scattered punctate cortical and cerebellar ischemia is atypical of hypoxia. A central embolic source such as carotid artery disease or cardiac thrombus were septal perforation may be related with diffuse embolic disease. 2. Confluent focus of restricted diffusion and signal abnormality within the  anteromedial right temporal lobe. This may be secondary to seizure focus with source of seizure.  CULTURES: MRSA 11/19>> neg Sputum Cx 11/19 >>multiple  BC x 2 11/19 >>1/2 coag neg staph>>>  ANTIBIOTICS: 11/20 Unasyn >>11/20 11/20 Vanc>>11.22 11/20 Zosyn >>  SIGNIFICANT EVENTS: 11/19  Admit to [facility] 11/20  Transfer to [facility]  LINES/TUBES: 11/19 ETT 11/19 L NGT 11/19 R PICC >>  DISCUSSION: 77 yoM w/PMH uncontrolled HTN and diabetes and hx of seizure (seizures associated w/ hyperglycemia) with witnessed seizures that became bradycardic in ER while postictal, requiring brief CPR, vomiting during intubation, with progressive renal failure, hypotension, worsening infiltrates.    ASSESSMENT / PLAN:  PULMONARY A: Acute hypoxic respiratory failure following seizure/ emesis Aspiration pneumonitis r/o pneumonia/ ARDS Hx Asthma - CXR 11/19 with development of bilateral opacities L > R P:   No role steroids, reduce On ards, keep plat less 30, repeat abg now pcxr in am  No sbt Goal peep to 8 now then to 5, likely Even to neg goal  CARDIOVASCULAR A:  Shock- presumed  septic vs related to sedation Brief CPR- ?3mins following bradycardia/ seizure P:  Tele monitoring HTn at times labetolol volume removal cvp follow up from 15  RENAL A:   AKI on CKD (previous sCr 1.46 in 5/16)- oliguria w/ CVP of 16 Hyperkalemia- stable, no QRS changes P:   cvvhd appreciated Agree 50-100 off, especially with hilar prom and HTN Chem follow up  GASTROINTESTINAL A:   NPO Transaminitis - possibly shock liver  P:   NPO lft in am  Start feeds  HEMATOLOGIC A:   Leukocytosis  P:  Trend CBC SCDs/ heparin for VTE ppx   INFECTIOUS A:   Aspiration pneumonia/ pneumonitis  / MODS - PCT > 150 P:   Pct down Allow vanc dc BC is contamination Zosyn keep  ENDOCRINE A:   Uncontrolled DM, ?possible HHNK P:   CBG q 4  insulin drip , transition to lantus   NEUROLOGIC A:   Acute  encephalopathy - possibly multifactorial- ddx anoxic injury vs toxic (not clearing previous sedation with AKI) vs CVA  - CT head 11/20 concerning for acute CVA vs hypoxic injury, pattern not typical for watershed infarct MRI brain - anoxia, concern emboli - EEG 11/20 neg for epileptiform discharges; severe generalized slowing pattern Seizure- hx of, on no home meds P:   RASS goal:0 Mri reviewed, re evaluate clinical status Friday fent prn , likely has some pain  FAMILY  - Updates: Mother and sister updated at bedside 11/20.  No family at bedside 11/21.  - Inter-disciplinary family meet or Palliative Care meeting due by:  [facility]  Ccm time 35 min   Mcarthur Rossettianiel J. Tyson AliasFeinstein, MD, FACP Pgr: [phone] [facility] 04/25/2017 7:31 AM

## 2017-04-25 NOTE — Progress Notes (Signed)
Subjective: Now with cough and gag reflexes per nursing.   Objective: Current vital signs: BP 114/75   Pulse (!) 103   Temp 99.1 F (37.3 C) (Axillary)   Resp (!) 27   Ht 5' 10"  (1.778 m)   Wt 122.7 kg (270 lb 8.1 oz)   SpO2 94%   BMI 38.81 kg/m  Vital signs in last 24 hours: Temp:  [97.6 F (36.4 C)-99.1 F (37.3 C)] 99.1 F (37.3 C) (11/22 1130) Pulse Rate:  [79-132] 103 (11/22 1200) Resp:  [15-45] 27 (11/22 1200) BP: (98-210)/(69-124) 114/75 (11/22 1200) SpO2:  [91 %-100 %] 94 % (11/22 1200) FiO2 (%):  [40 %-50 %] 40 % (11/22 0718) Weight:  [122.7 kg (270 lb 8.1 oz)] 122.7 kg (270 lb 8.1 oz) (11/22 0300)  Intake/Output from previous day: 11/21 0701 - 11/22 0700 In: 238.6 [I.V.:133.6; IV Piggyback:105] Out: 2371 [Urine:100; Emesis/NG output:250] Intake/Output this shift: Total I/O In: 150 [IV Piggyback:150] Out: 546 [Emesis/NG output:50; Other:577] Nutritional status: Diet NPO time specified  Neurologic Exam: Mentation: Minimal dorsiflexion of toes bilaterally to plantar stimulation, right with more movement than left.  No other responses to auditory, light or noxious stimuli. CN: Left pupil 3 mm and sluggishly reactive. Right pupil 3 mm and unreactive. No blink to threat. Corneal reflexes are present bilaterally. Intact cough and gag reflexes.  Motor/Sensory: Flaccid tone x 4. Minimal dorsiflexion of toes bilaterally to plantar stimulation, right with more movement than left.   Reflexes: 0 upper and lower extremities. Toes upgoing.   Lab Results: Results for orders placed or performed during the hospital encounter of 04/28/2017 (from the past 48 hour(s))  Glucose, capillary     Status: Abnormal   Collection Time: 04/23/17 12:19 PM  Result Value Ref Range   Glucose-Capillary 157 (H) 65 - 99 mg/dL  Glucose, capillary     Status: Abnormal   Collection Time: 04/23/17  1:40 PM  Result Value Ref Range   Glucose-Capillary 173 (H) 65 - 99 mg/dL  I-STAT 3, arterial blood  gas (G3+)     Status: Abnormal   Collection Time: 04/23/17  2:37 PM  Result Value Ref Range   pH, Arterial 7.141 (LL) 7.350 - 7.450   pCO2 arterial 66.2 (HH) 32.0 - 48.0 mmHg   pO2, Arterial 94.0 83.0 - 108.0 mmHg   Bicarbonate 22.4 20.0 - 28.0 mmol/L   TCO2 24 22 - 32 mmol/L   O2 Saturation 94.0 %   Acid-base deficit 7.0 (H) 0.0 - 2.0 mmol/L   Patient temperature 99.5 F    Collection site RADIAL, ALLEN'S TEST ACCEPTABLE    Drawn by Operator    Sample type ARTERIAL    Comment NOTIFIED PHYSICIAN   Glucose, capillary     Status: Abnormal   Collection Time: 04/23/17  2:48 PM  Result Value Ref Range   Glucose-Capillary 198 (H) 65 - 99 mg/dL  Lactic acid, plasma     Status: None   Collection Time: 04/23/17  3:23 PM  Result Value Ref Range   Lactic Acid, Venous 1.7 0.5 - 1.9 mmol/L  Procalcitonin - Baseline     Status: None   Collection Time: 04/23/17  3:23 PM  Result Value Ref Range   Procalcitonin >150.00 ng/mL    Comment:        Interpretation: PCT >= 10 ng/mL: Important systemic inflammatory response, almost exclusively due to severe bacterial sepsis or septic shock. (NOTE)         ICU PCT Algorithm  Non ICU PCT Algorithm    ----------------------------     ------------------------------         PCT < 0.25 ng/mL                 PCT < 0.1 ng/mL     Stopping of antibiotics            Stopping of antibiotics       strongly encouraged.               strongly encouraged.    ----------------------------     ------------------------------       PCT level decrease by               PCT < 0.25 ng/mL       >= 80% from peak PCT       OR PCT 0.25 - 0.5 ng/mL          Stopping of antibiotics                                             encouraged.     Stopping of antibiotics           encouraged.    ----------------------------     ------------------------------       PCT level decrease by              PCT >= 0.25 ng/mL       < 80% from peak PCT        AND PCT >= 0.5  ng/mL             Continuing antibiotics                                              encouraged.       Continuing antibiotics            encouraged.    ----------------------------     ------------------------------     PCT level increase compared          PCT > 0.5 ng/mL         with peak PCT AND          PCT >= 0.5 ng/mL             Escalation of antibiotics                                          strongly encouraged.      Escalation of antibiotics        strongly encouraged.   Renal function panel     Status: Abnormal   Collection Time: 04/23/17  3:23 PM  Result Value Ref Range   Sodium 134 (L) 135 - 145 mmol/L   Potassium 5.4 (H) 3.5 - 5.1 mmol/L   Chloride 107 101 - 111 mmol/L   CO2 18 (L) 22 - 32 mmol/L   Glucose, Bld 697 (HH) 65 - 99 mg/dL    Comment: CRITICAL RESULT CALLED TO, READ BACK BY AND VERIFIED WITH: HESTER,B RN 04/23/2017 1851 JORDANS SUGGESTED RECOLLECT TO VERIFY    BUN 29 (H) 6 - 20 mg/dL   Creatinine,  Ser 5.30 (H) 0.61 - 1.24 mg/dL   Calcium 6.1 (LL) 8.9 - 10.3 mg/dL    Comment: CRITICAL RESULT CALLED TO, READ BACK BY AND VERIFIED WITH: HESTER,B RN 04/23/2017 1851 JORDANS    Phosphorus 4.5 2.5 - 4.6 mg/dL   Albumin 2.1 (L) 3.5 - 5.0 g/dL   GFR calc non Af Amer 12 (L) >60 mL/min   GFR calc Af Amer 13 (L) >60 mL/min    Comment: (NOTE) The eGFR has been calculated using the CKD EPI equation. This calculation has not been validated in all clinical situations. eGFR's persistently <60 mL/min signify possible Chronic Kidney Disease.    Anion gap 9 5 - 15  Magnesium     Status: Abnormal   Collection Time: 04/23/17  3:23 PM  Result Value Ref Range   Magnesium 1.3 (L) 1.7 - 2.4 mg/dL  Cortisol     Status: None   Collection Time: 04/23/17  3:23 PM  Result Value Ref Range   Cortisol, Plasma 14.1 ug/dL    Comment: (NOTE) AM    6.7 - 22.6 ug/dL PM   <10.0       ug/dL   Hemoglobin A1c     Status: Abnormal   Collection Time: 04/23/17  3:23 PM  Result  Value Ref Range   Hgb A1c MFr Bld 9.4 (H) 4.8 - 5.6 %    Comment: (NOTE) Pre diabetes:          5.7%-6.4% Diabetes:              >6.4% Glycemic control for   <7.0% adults with diabetes    Mean Plasma Glucose 223.08 mg/dL  TSH     Status: None   Collection Time: 04/23/17  3:23 PM  Result Value Ref Range   TSH 1.292 0.350 - 4.500 uIU/mL    Comment: Performed by a 3rd Generation assay with a functional sensitivity of <=0.01 uIU/mL.  Creatinine, urine, random     Status: None   Collection Time: 04/23/17  3:32 PM  Result Value Ref Range   Creatinine, Urine 260.09 mg/dL  Sodium, urine, random     Status: None   Collection Time: 04/23/17  3:32 PM  Result Value Ref Range   Sodium, Ur 50 mmol/L  Glucose, capillary     Status: Abnormal   Collection Time: 04/23/17  3:41 PM  Result Value Ref Range   Glucose-Capillary 217 (H) 65 - 99 mg/dL   Comment 1 Notify RN    Comment 2 Document in Chart   .Cooxemetry Panel (carboxy, met, total hgb, O2 sat)     Status: Abnormal   Collection Time: 04/23/17  4:50 PM  Result Value Ref Range   Total hemoglobin 11.7 (L) 12.0 - 16.0 g/dL   O2 Saturation 89.5 %   Carboxyhemoglobin 0.7 0.5 - 1.5 %   Methemoglobin 1.1 0.0 - 1.5 %  Glucose, capillary     Status: Abnormal   Collection Time: 04/23/17  4:51 PM  Result Value Ref Range   Glucose-Capillary 246 (H) 65 - 99 mg/dL  I-STAT 3, arterial blood gas (G3+)     Status: Abnormal   Collection Time: 04/23/17  5:04 PM  Result Value Ref Range   pH, Arterial 7.204 (L) 7.350 - 7.450   pCO2 arterial 53.4 (H) 32.0 - 48.0 mmHg   pO2, Arterial 102.0 83.0 - 108.0 mmHg   Bicarbonate 20.8 20.0 - 28.0 mmol/L   TCO2 22 22 - 32 mmol/L   O2 Saturation 96.0 %  Acid-base deficit 7.0 (H) 0.0 - 2.0 mmol/L   Patient temperature 100.3 F    Collection site RADIAL, ALLEN'S TEST ACCEPTABLE    Drawn by RT    Sample type ARTERIAL   Glucose, capillary     Status: Abnormal   Collection Time: 04/23/17  5:56 PM  Result Value Ref  Range   Glucose-Capillary 215 (H) 65 - 99 mg/dL  Lipid panel     Status: Abnormal   Collection Time: 04/23/17  6:53 PM  Result Value Ref Range   Cholesterol 125 0 - 200 mg/dL   Triglycerides 154 (H) <150 mg/dL   HDL 36 (L) >40 mg/dL   Total CHOL/HDL Ratio 3.5 RATIO   VLDL 31 0 - 40 mg/dL   LDL Cholesterol 58 0 - 99 mg/dL    Comment:        Total Cholesterol/HDL:CHD Risk Coronary Heart Disease Risk Table                     Men   Women  1/2 Average Risk   3.4   3.3  Average Risk       5.0   4.4  2 X Average Risk   9.6   7.1  3 X Average Risk  23.4   11.0        Use the calculated Patient Ratio above and the CHD Risk Table to determine the patient's CHD Risk.        ATP III CLASSIFICATION (LDL):  <100     mg/dL   Optimal  100-129  mg/dL   Near or Above                    Optimal  130-159  mg/dL   Borderline  160-189  mg/dL   High  >190     mg/dL   Very High   Glucose, capillary     Status: Abnormal   Collection Time: 04/23/17  6:58 PM  Result Value Ref Range   Glucose-Capillary 246 (H) 65 - 99 mg/dL  Glucose, capillary     Status: Abnormal   Collection Time: 04/23/17  8:01 PM  Result Value Ref Range   Glucose-Capillary 181 (H) 65 - 99 mg/dL  Glucose, capillary     Status: Abnormal   Collection Time: 04/23/17  9:02 PM  Result Value Ref Range   Glucose-Capillary 160 (H) 65 - 99 mg/dL  Glucose, capillary     Status: Abnormal   Collection Time: 04/23/17 10:00 PM  Result Value Ref Range   Glucose-Capillary 152 (H) 65 - 99 mg/dL  Glucose, capillary     Status: Abnormal   Collection Time: 04/23/17 11:06 PM  Result Value Ref Range   Glucose-Capillary 157 (H) 65 - 99 mg/dL  Lactic acid, plasma     Status: None   Collection Time: 04/23/17 11:15 PM  Result Value Ref Range   Lactic Acid, Venous 1.6 0.5 - 1.9 mmol/L  Comprehensive metabolic panel     Status: Abnormal   Collection Time: 04/23/17 11:15 PM  Result Value Ref Range   Sodium 139 135 - 145 mmol/L   Potassium 5.0  3.5 - 5.1 mmol/L   Chloride 110 101 - 111 mmol/L   CO2 20 (L) 22 - 32 mmol/L   Glucose, Bld 142 (H) 65 - 99 mg/dL   BUN 39 (H) 6 - 20 mg/dL   Creatinine, Ser 6.96 (H) 0.61 - 1.24 mg/dL   Calcium 6.9 (L) 8.9 -  10.3 mg/dL   Total Protein 5.8 (L) 6.5 - 8.1 g/dL   Albumin 2.3 (L) 3.5 - 5.0 g/dL   AST 531 (H) 15 - 41 U/L   ALT 264 (H) 17 - 63 U/L   Alkaline Phosphatase 59 38 - 126 U/L   Total Bilirubin 1.0 0.3 - 1.2 mg/dL   GFR calc non Af Amer 8 (L) >60 mL/min   GFR calc Af Amer 10 (L) >60 mL/min    Comment: (NOTE) The eGFR has been calculated using the CKD EPI equation. This calculation has not been validated in all clinical situations. eGFR's persistently <60 mL/min signify possible Chronic Kidney Disease.    Anion gap 9 5 - 15  Glucose, capillary     Status: Abnormal   Collection Time: 04/23/17 11:52 PM  Result Value Ref Range   Glucose-Capillary 142 (H) 65 - 99 mg/dL  Glucose, capillary     Status: Abnormal   Collection Time: 04/24/17 12:54 AM  Result Value Ref Range   Glucose-Capillary 139 (H) 65 - 99 mg/dL  Glucose, capillary     Status: Abnormal   Collection Time: 04/24/17  1:57 AM  Result Value Ref Range   Glucose-Capillary 137 (H) 65 - 99 mg/dL  Glucose, capillary     Status: Abnormal   Collection Time: 04/24/17  2:55 AM  Result Value Ref Range   Glucose-Capillary 143 (H) 65 - 99 mg/dL  Glucose, capillary     Status: Abnormal   Collection Time: 04/24/17  4:13 AM  Result Value Ref Range   Glucose-Capillary 130 (H) 65 - 99 mg/dL  Procalcitonin     Status: None   Collection Time: 04/24/17  4:32 AM  Result Value Ref Range   Procalcitonin >150.00 ng/mL    Comment:        Interpretation: PCT >= 10 ng/mL: Important systemic inflammatory response, almost exclusively due to severe bacterial sepsis or septic shock. (NOTE)         ICU PCT Algorithm               Non ICU PCT Algorithm    ----------------------------     ------------------------------         PCT < 0.25  ng/mL                 PCT < 0.1 ng/mL     Stopping of antibiotics            Stopping of antibiotics       strongly encouraged.               strongly encouraged.    ----------------------------     ------------------------------       PCT level decrease by               PCT < 0.25 ng/mL       >= 80% from peak PCT       OR PCT 0.25 - 0.5 ng/mL          Stopping of antibiotics                                             encouraged.     Stopping of antibiotics           encouraged.    ----------------------------     ------------------------------       PCT level decrease  by              PCT >= 0.25 ng/mL       < 80% from peak PCT        AND PCT >= 0.5 ng/mL             Continuing antibiotics                                              encouraged.       Continuing antibiotics            encouraged.    ----------------------------     ------------------------------     PCT level increase compared          PCT > 0.5 ng/mL         with peak PCT AND          PCT >= 0.5 ng/mL             Escalation of antibiotics                                          strongly encouraged.      Escalation of antibiotics        strongly encouraged.   CBC     Status: Abnormal   Collection Time: 04/24/17  4:32 AM  Result Value Ref Range   WBC 17.1 (H) 4.0 - 10.5 K/uL   RBC 4.17 (L) 4.22 - 5.81 MIL/uL   Hemoglobin 11.8 (L) 13.0 - 17.0 g/dL   HCT 34.7 (L) 39.0 - 52.0 %   MCV 83.2 78.0 - 100.0 fL   MCH 28.3 26.0 - 34.0 pg   MCHC 34.0 30.0 - 36.0 g/dL   RDW 15.5 11.5 - 15.5 %   Platelets 192 150 - 400 K/uL  Comprehensive metabolic panel     Status: Abnormal   Collection Time: 04/24/17  4:32 AM  Result Value Ref Range   Sodium 140 135 - 145 mmol/L   Potassium 5.5 (H) 3.5 - 5.1 mmol/L   Chloride 111 101 - 111 mmol/L   CO2 20 (L) 22 - 32 mmol/L   Glucose, Bld 142 (H) 65 - 99 mg/dL   BUN 45 (H) 6 - 20 mg/dL   Creatinine, Ser 7.71 (H) 0.61 - 1.24 mg/dL   Calcium 7.1 (L) 8.9 - 10.3 mg/dL   Total  Protein 5.9 (L) 6.5 - 8.1 g/dL   Albumin 2.2 (L) 3.5 - 5.0 g/dL   AST 580 (H) 15 - 41 U/L   ALT 294 (H) 17 - 63 U/L   Alkaline Phosphatase 67 38 - 126 U/L   Total Bilirubin 1.0 0.3 - 1.2 mg/dL   GFR calc non Af Amer 7 (L) >60 mL/min   GFR calc Af Amer 9 (L) >60 mL/min    Comment: (NOTE) The eGFR has been calculated using the CKD EPI equation. This calculation has not been validated in all clinical situations. eGFR's persistently <60 mL/min signify possible Chronic Kidney Disease.    Anion gap 9 5 - 15  Glucose, capillary     Status: Abnormal   Collection Time: 04/24/17  5:03 AM  Result Value Ref Range   Glucose-Capillary 144 (H) 65 - 99 mg/dL  Glucose, capillary  Status: Abnormal   Collection Time: 04/24/17  7:36 AM  Result Value Ref Range   Glucose-Capillary 178 (H) 65 - 99 mg/dL   Comment 1 Notify RN    Comment 2 Document in Chart   Blood gas, arterial     Status: Abnormal   Collection Time: 04/24/17  8:25 AM  Result Value Ref Range   FIO2 60.00    Delivery systems VENTILATOR    Mode PRESSURE REGULATED VOLUME CONTROL    VT 550 mL   LHR 20 resp/min   Peep/cpap 10.0 cm H20   pH, Arterial 7.244 (L) 7.350 - 7.450   pCO2 arterial 38.0 32.0 - 48.0 mmHg   pO2, Arterial 136 (H) 83.0 - 108.0 mmHg   Bicarbonate 15.6 (L) 20.0 - 28.0 mmol/L   Acid-base deficit 10.1 (H) 0.0 - 2.0 mmol/L   O2 Saturation 98.0 %   Patient temperature 100.1    Collection site LEFT RADIAL    Drawn by [id number]    Sample type ARTERIAL DRAW    Allens test (pass/fail) PASS PASS  Protime-INR     Status: Abnormal   Collection Time: 04/24/17 10:00 AM  Result Value Ref Range   Prothrombin Time 16.9 (H) 11.4 - 15.2 seconds   INR 1.39   APTT     Status: None   Collection Time: 04/24/17 10:00 AM  Result Value Ref Range   aPTT 32 24 - 36 seconds  Glucose, capillary     Status: Abnormal   Collection Time: 04/24/17 11:06 AM  Result Value Ref Range   Glucose-Capillary 218 (H) 65 - 99 mg/dL   Comment 1  Notify RN    Comment 2 Document in Chart   Glucose, capillary     Status: Abnormal   Collection Time: 04/24/17  3:28 PM  Result Value Ref Range   Glucose-Capillary 260 (H) 65 - 99 mg/dL   Comment 1 Notify RN    Comment 2 Document in Chart   Glucose, capillary     Status: Abnormal   Collection Time: 04/24/17  5:17 PM  Result Value Ref Range   Glucose-Capillary 240 (H) 65 - 99 mg/dL  Glucose, capillary     Status: Abnormal   Collection Time: 04/24/17  6:21 PM  Result Value Ref Range   Glucose-Capillary 288 (H) 65 - 99 mg/dL  Glucose, capillary     Status: Abnormal   Collection Time: 04/24/17  7:31 PM  Result Value Ref Range   Glucose-Capillary 257 (H) 65 - 99 mg/dL  Glucose, capillary     Status: Abnormal   Collection Time: 04/24/17  7:45 PM  Result Value Ref Range   Glucose-Capillary 233 (H) 65 - 99 mg/dL  Glucose, capillary     Status: Abnormal   Collection Time: 04/24/17  8:43 PM  Result Value Ref Range   Glucose-Capillary 231 (H) 65 - 99 mg/dL  Glucose, capillary     Status: Abnormal   Collection Time: 04/24/17  9:53 PM  Result Value Ref Range   Glucose-Capillary 205 (H) 65 - 99 mg/dL  Glucose, capillary     Status: Abnormal   Collection Time: 04/24/17 11:09 PM  Result Value Ref Range   Glucose-Capillary 174 (H) 65 - 99 mg/dL  Glucose, capillary     Status: Abnormal   Collection Time: 04/24/17 11:57 PM  Result Value Ref Range   Glucose-Capillary 157 (H) 65 - 99 mg/dL  Glucose, capillary     Status: Abnormal   Collection Time: 04/25/17 12:53 AM  Result Value Ref Range   Glucose-Capillary 140 (H) 65 - 99 mg/dL  Glucose, capillary     Status: Abnormal   Collection Time: 04/25/17  1:56 AM  Result Value Ref Range   Glucose-Capillary 129 (H) 65 - 99 mg/dL  Glucose, capillary     Status: Abnormal   Collection Time: 04/25/17  3:15 AM  Result Value Ref Range   Glucose-Capillary 125 (H) 65 - 99 mg/dL  Procalcitonin     Status: None   Collection Time: 04/25/17  4:08 AM   Result Value Ref Range   Procalcitonin 132.07 ng/mL    Comment:        Interpretation: PCT >= 10 ng/mL: Important systemic inflammatory response, almost exclusively due to severe bacterial sepsis or septic shock. (NOTE)         ICU PCT Algorithm               Non ICU PCT Algorithm    ----------------------------     ------------------------------         PCT < 0.25 ng/mL                 PCT < 0.1 ng/mL     Stopping of antibiotics            Stopping of antibiotics       strongly encouraged.               strongly encouraged.    ----------------------------     ------------------------------       PCT level decrease by               PCT < 0.25 ng/mL       >= 80% from peak PCT       OR PCT 0.25 - 0.5 ng/mL          Stopping of antibiotics                                             encouraged.     Stopping of antibiotics           encouraged.    ----------------------------     ------------------------------       PCT level decrease by              PCT >= 0.25 ng/mL       < 80% from peak PCT        AND PCT >= 0.5 ng/mL             Continuing antibiotics                                              encouraged.       Continuing antibiotics            encouraged.    ----------------------------     ------------------------------     PCT level increase compared          PCT > 0.5 ng/mL         with peak PCT AND          PCT >= 0.5 ng/mL             Escalation of antibiotics  strongly encouraged.      Escalation of antibiotics        strongly encouraged.   CBC     Status: Abnormal   Collection Time: 04/25/17  4:08 AM  Result Value Ref Range   WBC 14.2 (H) 4.0 - 10.5 K/uL   RBC 4.79 4.22 - 5.81 MIL/uL   Hemoglobin 13.5 13.0 - 17.0 g/dL   HCT 38.9 (L) 39.0 - 52.0 %   MCV 81.2 78.0 - 100.0 fL   MCH 28.2 26.0 - 34.0 pg   MCHC 34.7 30.0 - 36.0 g/dL   RDW 15.4 11.5 - 15.5 %   Platelets 171 150 - 400 K/uL  Magnesium     Status: Abnormal    Collection Time: 04/25/17  4:08 AM  Result Value Ref Range   Magnesium 2.5 (H) 1.7 - 2.4 mg/dL  Renal function panel     Status: Abnormal   Collection Time: 04/25/17  4:08 AM  Result Value Ref Range   Sodium 141 135 - 145 mmol/L   Potassium 5.1 3.5 - 5.1 mmol/L   Chloride 108 101 - 111 mmol/L   CO2 21 (L) 22 - 32 mmol/L   Glucose, Bld 130 (H) 65 - 99 mg/dL   BUN 57 (H) 6 - 20 mg/dL   Creatinine, Ser 7.67 (H) 0.61 - 1.24 mg/dL   Calcium 7.7 (L) 8.9 - 10.3 mg/dL   Phosphorus 6.6 (H) 2.5 - 4.6 mg/dL   Albumin 2.1 (L) 3.5 - 5.0 g/dL   GFR calc non Af Amer 7 (L) >60 mL/min   GFR calc Af Amer 9 (L) >60 mL/min    Comment: (NOTE) The eGFR has been calculated using the CKD EPI equation. This calculation has not been validated in all clinical situations. eGFR's persistently <60 mL/min signify possible Chronic Kidney Disease.    Anion gap 12 5 - 15  Glucose, capillary     Status: Abnormal   Collection Time: 04/25/17  4:14 AM  Result Value Ref Range   Glucose-Capillary 120 (H) 65 - 99 mg/dL  Glucose, capillary     Status: Abnormal   Collection Time: 04/25/17  5:20 AM  Result Value Ref Range   Glucose-Capillary 123 (H) 65 - 99 mg/dL  Glucose, capillary     Status: Abnormal   Collection Time: 04/25/17  5:56 AM  Result Value Ref Range   Glucose-Capillary 117 (H) 65 - 99 mg/dL  Glucose, capillary     Status: Abnormal   Collection Time: 04/25/17  7:13 AM  Result Value Ref Range   Glucose-Capillary 180 (H) 65 - 99 mg/dL  Magnesium     Status: Abnormal   Collection Time: 04/25/17  8:40 AM  Result Value Ref Range   Magnesium 2.5 (H) 1.7 - 2.4 mg/dL  Phosphorus     Status: Abnormal   Collection Time: 04/25/17  8:40 AM  Result Value Ref Range   Phosphorus 6.3 (H) 2.5 - 4.6 mg/dL  I-STAT 3, arterial blood gas (G3+)     Status: Abnormal   Collection Time: 04/25/17  8:53 AM  Result Value Ref Range   pH, Arterial 7.438 7.350 - 7.450   pCO2 arterial 30.7 (L) 32.0 - 48.0 mmHg   pO2,  Arterial 136.0 (H) 83.0 - 108.0 mmHg   Bicarbonate 20.8 20.0 - 28.0 mmol/L   TCO2 22 22 - 32 mmol/L   O2 Saturation 99.0 %   Acid-base deficit 3.0 (H) 0.0 - 2.0 mmol/L   Patient temperature 98.6 F  Collection site RADIAL, ALLEN'S TEST ACCEPTABLE    Drawn by RT    Sample type ARTERIAL   Glucose, capillary     Status: Abnormal   Collection Time: 04/25/17 11:30 AM  Result Value Ref Range   Glucose-Capillary 150 (H) 65 - 99 mg/dL    Recent Results (from the past 240 hour(s))  MRSA PCR Screening     Status: None   Collection Time: 04/21/2017  3:32 PM  Result Value Ref Range Status   MRSA by PCR NEGATIVE NEGATIVE Final    Comment:        The GeneXpert MRSA Assay (FDA approved for NASAL specimens only), is one component of a comprehensive MRSA colonization surveillance program. It is not intended to diagnose MRSA infection nor to guide or monitor treatment for MRSA infections.   Culture, expectorated sputum-assessment     Status: None   Collection Time: 04/13/2017  4:40 PM  Result Value Ref Range Status   Specimen Description SPU  Final   Special Requests SPU  Final   Sputum evaluation THIS SPECIMEN IS ACCEPTABLE FOR SPUTUM CULTURE  Final   Report Status 04/23/2017 FINAL  Final  Culture, respiratory (NON-Expectorated)     Status: Abnormal   Collection Time: 04/19/2017  4:40 PM  Result Value Ref Range Status   Specimen Description SPU  Final   Special Requests SPU Reflexed from [id number]  Final   Gram Stain   Final    FEW WBC PRESENT, PREDOMINANTLY PMN RARE SQUAMOUS EPITHELIAL CELLS PRESENT MODERATE GRAM POSITIVE COCCI IN CHAINS MODERATE BUDDING YEAST SEEN FEW GRAM NEGATIVE RODS Performed at [facility], [place] [address] [place], [place] [address]    Culture MULTIPLE ORGANISMS PRESENT, NONE PREDOMINANT (A)  Final   Report Status 04/24/2017 FINAL  Final  Culture, blood (Routine X 2) w Reflex to ID Panel     Status: Abnormal   Collection Time: 04/10/2017  6:58 PM  Result  Value Ref Range Status   Specimen Description BLOOD RIGHT ARM  Final   Special Requests   Final    BOTTLES DRAWN AEROBIC AND ANAEROBIC Blood Culture adequate volume   Culture  Setup Time   Final    GRAM POSITIVE COCCI AEROBIC BOTTLE ONLY Gram Stain Report Called to,Read Back By and Verified With: JAMIE WHITE, RN @ [place] @ 1600 ON 11.20.18 BY BOWMAN,L CRITICAL RESULT CALLED TO, READ BACK BY AND VERIFIED WITH: PHARMD V BRYK [id number] MLM    Culture (A)  Final    STAPHYLOCOCCUS SPECIES (COAGULASE NEGATIVE) THE SIGNIFICANCE OF ISOLATING THIS ORGANISM FROM A SINGLE SET OF BLOOD CULTURES WHEN MULTIPLE SETS ARE DRAWN IS UNCERTAIN. PLEASE NOTIFY THE MICROBIOLOGY DEPARTMENT WITHIN ONE WEEK IF SPECIATION AND SENSITIVITIES ARE REQUIRED. Performed at [facility], [place] [address], [place], [place] [address]    Report Status 04/25/2017 FINAL  Final  Blood Culture ID Panel (Reflexed)     Status: Abnormal   Collection Time: 04/19/2017  6:58 PM  Result Value Ref Range Status   Enterococcus species NOT DETECTED NOT DETECTED Final   Listeria monocytogenes NOT DETECTED NOT DETECTED Final   Staphylococcus species DETECTED (A) NOT DETECTED Final    Comment: Methicillin (oxacillin) susceptible coagulase negative staphylococcus. Possible blood culture contaminant (unless isolated from more than one blood culture draw or clinical case suggests pathogenicity). No antibiotic treatment is indicated for blood  culture contaminants. CRITICAL RESULT CALLED TO, READ BACK BY AND VERIFIED WITH: [place] [id number] MLM  Staphylococcus aureus NOT DETECTED NOT DETECTED Final   Methicillin resistance NOT DETECTED NOT DETECTED Final   Streptococcus species NOT DETECTED NOT DETECTED Final   Streptococcus agalactiae NOT DETECTED NOT DETECTED Final   Streptococcus pneumoniae NOT DETECTED NOT DETECTED Final   Streptococcus pyogenes NOT DETECTED NOT DETECTED Final   Acinetobacter baumannii NOT DETECTED NOT DETECTED  Final   Enterobacteriaceae species NOT DETECTED NOT DETECTED Final   Enterobacter cloacae complex NOT DETECTED NOT DETECTED Final   Escherichia coli NOT DETECTED NOT DETECTED Final   Klebsiella oxytoca NOT DETECTED NOT DETECTED Final   Klebsiella pneumoniae NOT DETECTED NOT DETECTED Final   Proteus species NOT DETECTED NOT DETECTED Final   Serratia marcescens NOT DETECTED NOT DETECTED Final   Haemophilus influenzae NOT DETECTED NOT DETECTED Final   Neisseria meningitidis NOT DETECTED NOT DETECTED Final   Pseudomonas aeruginosa NOT DETECTED NOT DETECTED Final   Candida albicans NOT DETECTED NOT DETECTED Final   Candida glabrata NOT DETECTED NOT DETECTED Final   Candida krusei NOT DETECTED NOT DETECTED Final   Candida parapsilosis NOT DETECTED NOT DETECTED Final   Candida tropicalis NOT DETECTED NOT DETECTED Final    Comment: Performed at [facility], [address] [place], [place] [address]  Culture, blood (Routine X 2) w Reflex to ID Panel     Status: None (Preliminary result)   Collection Time: 04/06/2017  6:59 PM  Result Value Ref Range Status   Specimen Description BLOOD LEFT ARM  Final   Special Requests   Final    BOTTLES DRAWN AEROBIC AND ANAEROBIC Blood Culture adequate volume   Culture NO GROWTH 3 DAYS  Final   Report Status PENDING  Incomplete    Lipid Panel Recent Labs    04/23/17 1853  CHOL 125  TRIG 154*  HDL 36*  CHOLHDL 3.5  VLDL 31  LDLCALC 58    Studies/Results: Ct Head Wo Contrast  Addendum Date: 04/23/2017   ADDENDUM REPORT: 04/23/2017 16:35 ADDENDUM: Critical Value/emergent results were called by telephone at the time of interpretation on 04/23/2017 at 4:34 pm to Dr. Marolyn Hammock, who verbally acknowledged these results. Electronically Signed   By: Titus Dubin M.D.   On: 04/23/2017 16:35   Result Date: 04/23/2017 CLINICAL DATA:  Altered mental status. EXAM: CT HEAD WITHOUT CONTRAST TECHNIQUE: Contiguous axial images were obtained from the  base of the skull through the vertex without intravenous contrast. COMPARISON:  CT head from yesterday. FINDINGS: Brain: New hypodensity and loss of the gray-white matter differentiation in the right insula and operculum. New focal hypodensity within the left internal capsule. No hemorrhage, hydrocephalus, extra-axial collection, or mass lesion/mass effect. Vascular: No hyperdense vessel or unexpected calcification. Skull: Normal. Negative for fracture or focal lesion. Sinuses/Orbits: Unchanged air-fluid levels within the bilateral paranasal sinuses. The mastoid air cells are clear. Other: Partially visualized endotracheal and nasogastric tubes. IMPRESSION: 1. New hypodensity and loss of gray-white matter differentiation in the right insula and operculum, concerning for acute infarct. Additional focal hypodensity within the left internal capsule is also suspicious for infarct. These could be related to hypoperfusion given the clinical history of cardiac arrest, however they are not the typical watershed infarct zones. 2. Unchanged acute pansinusitis. Radiology assistant personnel have been notified to put me in telephone contact with the referring physician or the referring physician's clinical representative in order to discuss these findings. Once this communication is established I will issue an addendum to this report for documentation purposes. Electronically Signed: By: Gwyndolyn Saxon  Marzella Schlein M.D. On: 04/23/2017 16:29   Mr Brain Wo Contrast  Result Date: 04/24/2017 CLINICAL DATA:  Encephalopathy. EXAM: MRI HEAD WITHOUT CONTRAST TECHNIQUE: Multiplanar, multiecho pulse sequences of the brain and surrounding structures were obtained without intravenous contrast. COMPARISON:  CT head without contrast 04/23/2017. FINDINGS: Brain: Diffusion-weighted images is demonstrate extensive acute/subacute infarction. There are confluent cortical nonhemorrhagic infarcts involving the right temporal lobe and frontal operculum  extending into the sylvian fissure and along the central sulcus. There is involvement of the medial frontal lobes, right greater than left. More scattered areas of cortical infarction are present in the left hemisphere. There is diffuse involvement with globus pallidus bilaterally with profound restricted diffusion. A focal area of restricted diffusion is present laterally in the left thalamus and more centrally in the right thalamus. A single punctate focus of restricted diffusion is present in the anterior right pons. A remote lacunar infarct is present in the posteromedial right cerebellum measuring 14 mm. Other punctate foci are present in the cerebellum bilaterally. There is a focus in the right occipital lobe. These areas all demonstrate increased T2 signal compatible with a subacute time frame. There is some susceptibility in the high left frontal convexity which may represent some hemorrhage. Ventricles are of normal size. No significant extra-axial fluid collection is present. Internal auditory canals are within normal limits. Vascular: Flow is present in the major intracranial arteries. Skull and upper cervical spine: The skullbase is within normal limits. Patient is intubated. NG tube is in place. Sinuses/Orbits: Fluid levels are present in the nasopharynx. There fluid levels in the sphenoid sinuses and maxillary sinuses bilaterally. Bilateral mastoid effusions are present. Globes and orbits are within normal limits. IMPRESSION: 1. 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 Some of this reflects the sequela of a profound diffuse anoxic injury. The more peripheral scattered punctate cortical and cerebellar ischemia is atypical of hypoxia. A central embolic source such as carotid artery disease or cardiac thrombus were septal perforation may be related with diffuse embolic disease. 2. Confluent focus of restricted  diffusion and signal abnormality within the anteromedial right temporal lobe. This may be secondary to seizure focus with source of seizure. 3. Minimal susceptibility over the high left frontal convexity may represent some petechial hemorrhage. 4. Extensive sinus disease related to intubation and NG tube. These results were called by telephone at the time of interpretation on 04/24/2017 at 3:21 pm to Dr. Titus Mould, who verbally acknowledged these results. Electronically Signed   By: San Morelle M.D.   On: 04/24/2017 15:21   Dg Chest Port 1 View  Result Date: 04/24/2017 CLINICAL DATA:  Central line placement. EXAM: PORTABLE CHEST 1 VIEW COMPARISON:  Radiograph of same day. FINDINGS: Endotracheal and nasogastric tubes are unchanged in position. Right-sided PICC line is unchanged in position. Interval placement of right internal jugular dialysis catheter with distal tip in expected position of SVC. Stable cardiomediastinal silhouette. Stable bilateral patchy airspace opacities are noted concerning for inflammation or edema. Bony thorax is unremarkable. IMPRESSION: Stable support apparatus. Interval placement of right internal jugular dialysis catheter with distal tip in expected position of the SVC. Stable bilateral lung opacities are noted. Electronically Signed   By: Marijo Conception, M.D.   On: 04/24/2017 15:54   Dg Chest Port 1 View  Result Date: 04/24/2017 CLINICAL DATA:  Intubated EXAM: PORTABLE CHEST 1 VIEW COMPARISON:  04/23/2017 FINDINGS: Support devices are stable. Multifocal airspace opacities are again noted, minimally  improved compatible with multifocal pneumonia. Heart is normal size. No effusions. IMPRESSION: Slight improvement in multifocal bilateral airspace opacity/pneumonia. Electronically Signed   By: Rolm Baptise M.D.   On: 04/24/2017 09:29   Dg Chest Port 1 View  Result Date: 04/23/2017 CLINICAL DATA:  Hypoxia EXAM: PORTABLE CHEST 1 VIEW COMPARISON:  April 22, 2017  FINDINGS: Endotracheal tube tip is 4.2 cm above the carina. Central catheter tip is in the superior vena cava. Nasogastric tube tip and side port are below the diaphragm. No pneumothorax. There has been considerable clearing of airspace consolidation from the left lung compared to 1 day prior. There is patchy alveolar opacity throughout both lungs currently. There is no new opacity. Heart size and pulmonary vascularity are normal. No adenopathy. No bone lesions. IMPRESSION: One shadow positions as described without pneumothorax. Multifocal airspace opacity, likely pneumonia, with considerably less consolidation on the left compared to 1 day prior. There may be a degree of superimposed noncardiogenic edema and/or ARDS. More than one of these entities may exist concurrently. Electronically Signed   By: Lowella Grip III M.D.   On: 04/23/2017 15:02    Medications:  Scheduled: . chlorhexidine gluconate (MEDLINE KIT)  15 mL Mouth Rinse BID  . feeding supplement (PRO-STAT SUGAR FREE 64)  30 mL Per Tube BID  . feeding supplement (VITAL HIGH PROTEIN)  1,000 mL Per Tube Q24H  . heparin subcutaneous  5,000 Units Subcutaneous Q12H  . insulin aspart  0-20 Units Subcutaneous TID WC  . insulin aspart  4 Units Subcutaneous Q4H  . insulin glargine  20 Units Subcutaneous Q24H  . mouth rinse  15 mL Mouth Rinse 10 times per day  . methylPREDNISolone (SOLU-MEDROL) injection  20 mg Intravenous Daily  . pantoprazole (PROTONIX) IV  40 mg Intravenous Q24H   Continuous: . dextrose    . levETIRAcetam Stopped (04/25/17 0941)  . piperacillin-tazobactam (ZOSYN)  IV Stopped (04/25/17 1202)  . dialysis replacement fluid (prismasate) 300 mL/hr at 04/24/17 1836  . dialysis replacement fluid (prismasate) 200 mL/hr at 04/25/17 1204  . dialysate (PRISMASATE) 1,500 mL/hr at 04/25/17 0941  . sodium chloride      Assessment:46 y.o.maleinitially presenting to OSH with status epilepticus in the setting of hyperosmolar  hyperglycemic state  1.I agree with Dr. Freddie Apley assessment that this may be a case of provoked seizures due to hyperglycemia. I agree thatgiven the severe complications,the patientshouldbe maintained on long-term antiseizure medications. 2. Subsequent MRI brain reveals extensive areas of acute/subacute nonhemorrhagic infarct throughout the brain with involvement of the globus pallidus bilaterally. Some of this reflects the sequela of a profound diffuse anoxic injury. The more peripheral scattered punctate cortical and cerebellar signal abnormality is atypical of hypoxia per Radiology report, but based upon my review of the images are compatible with cytotoxic cortical edema secondary to hypoxic ischemic injury or, less likely, prolonged seizure activity. Also noted is a confluent focus of restricted diffusion and signal abnormality within the anteromedial right temporal lobe, which also militates in favor of hypoxic injury versus cytotoxic edema secondary to prolonged seizure. Minimal susceptibility over the high left frontal convexity may represent some petechial hemorrhage. 3. Based upon his examination findings as well as the MRI results, diffuse anoxic brain injury from the brief code at the outside ER is the highest component of the DDx. Slow metabolism of sedating meds is also possibly playing a role, but this is felt to be significantly less likely. Coma with depressed brainstem reflexes would not generally be expected in a  postictal state, even following status epilepticus.  4. EEG 11/20: This is a very low voltage recording off sedation. Background frequencies are in the 3-4 Hz range and symmetrical. The patient has no response to stimulation and EEG shows no reactivity to stimulation. There are no epileptiform discharges or electrographic seizures. The severe generalized slowing of brain activity is non-specific but may be due to global anoxic brain injury, toxic, metabolic, or infectious  etiologies. The patient is not in non-convulsive status epilepticus.  Recommendations: 1.Continue Keppra 500 mg BID 2. Frequent neuro checks 3. Seizure precautions 4. Poor prognosis for a meaningful neurological recovery, given minimal clinical improvement and the MRI findings.      LOS: 3 days   @Electronically  signed: Dr. Kerney Elbe 04/25/2017  12:13 PM

## 2017-04-25 NOTE — Progress Notes (Signed)
[facility] Rounding Note  Subjective/Interval History:  Non responsive No sedation CRRT (04/24/17) MRI results noted  Objective Vital signs in last 24 hours: Vitals:   04/25/17 0615 04/25/17 0630 04/25/17 0645 04/25/17 0700  BP: 115/79 112/78 109/80 107/76  Pulse: 85 86 90 94  Resp: (!) 30 (!) 30 (!) 30 (!) 30  Temp:      TempSrc:      SpO2: 100% 100% 100% 100%  Weight:      Height:       Weight change: 2.9 kg (6 lb 6.3 oz)  Intake/Output Summary (Last 24 hours) at 04/25/2017 0741 Last data filed at 04/25/2017 0700 Gross per 24 hour  Intake 238.57 ml  Output 2371 ml  Net -2132.43 ml   Physical Exam:  Blood pressure 107/76, pulse 94, temperature 97.6 F (36.4 C), temperature source Oral, resp. rate (!) 30, height _0  (1.778 m), weight 122.7 kg (270 lb 8.1 oz), SpO2 100 %.  Gen: intubated, unresponsive CVP 16 Lines/tubes: ETT, foley, R PICC, NGT, R IJ trialysis catheter (11/21) Neck: Line in place Chest: Coarse BS, clear Heart: Regular. S4 S1 S2 Abdomen: soft, mild distension,  faint BS Ext: trace LE edema Neuro: Unresponsive to painful stimuli (to me) Dialysis Access:  R IJ Trialysis  Recent Labs  Lab 04/23/17 0025 04/23/17 0201 04/23/17 0623 04/23/17 1523 04/23/17 2315 04/24/17 0432 04/25/17 0408  NA 135 146* 141 134* 139 140 141  K 2.6* 4.3 5.5* 5.4* 5.0 5.5* 5.1  CL 112* 116* 110 107 110 111 108  CO2 19* 25 24 18* 20* 20* 21*  GLUCOSE 764* 145* 157* 697* 142* 142* 130*  BUN 18 24* 26* 29* 39* 45* 57*  CREATININE 2.91* 3.87* 4.66* 5.30* 6.96* 7.71* 7.67*  CALCIUM 5.6* 8.0* 7.5* 6.1* 6.9* 7.1* 7.7*  PHOS  --   --   --  4.5  --   --  6.6*    Recent Labs  Lab 04/13/2017 0908  04/23/17 2315 04/24/17 0432 04/25/17 0408  AST 25  --  531* 580*  --   ALT 26  --  264* 294*  --   ALKPHOS 114  --  59 67  --   BILITOT 0.8  --  1.0 1.0  --   PROT 8.4*  --  5.8* 5.9*  --   ALBUMIN 4.0   < > 2.3* 2.2* 2.1*   < > = values in this interval not displayed.     Recent Labs  Lab 04/10/2017 0908 04/17/2017 0911 04/23/17 0201 04/24/17 0432 04/25/17 0408  WBC 18.9*  --  18.6* 17.1* 14.2*  NEUTROABS 13.9*  --   --   --   --   HGB 15.5 16.3 13.4 11.8* 13.5  HCT 44.7 48.0 40.9 34.7* 38.9*  MCV 80.7  --  85.7 83.2 81.2  PLT 296  --  210 192 171    Recent Labs  Lab 04/10/2017 0911  TROPONINI <0.03    Recent Labs  Lab 04/25/17 0156 04/25/17 0315 04/25/17 0414 04/25/17 0520 04/25/17 0556  GLUCAP 129* 125* 120* 123* 117*    Studies/Results: Ct Head Wo Contrast  Addendum Date: 04/23/2017   ADDENDUM REPORT: 04/23/2017 16:35 ADDENDUM: Critical Value/emergent results were called by telephone at the time of interpretation on 04/23/2017 at 4:34 pm to Dr. Marolyn Hammock, who verbally acknowledged these results. Electronically Signed   By: Titus Dubin M.D.   On: 04/23/2017 16:35   Result Date: 04/23/2017 CLINICAL DATA:  Altered mental  status. EXAM: CT HEAD WITHOUT CONTRAST TECHNIQUE: Contiguous axial images were obtained from the base of the skull through the vertex without intravenous contrast. COMPARISON:  CT head from yesterday. FINDINGS: Brain: New hypodensity and loss of the gray-white matter differentiation in the right insula and operculum. New focal hypodensity within the left internal capsule. No hemorrhage, hydrocephalus, extra-axial collection, or mass lesion/mass effect. Vascular: No hyperdense vessel or unexpected calcification. Skull: Normal. Negative for fracture or focal lesion. Sinuses/Orbits: Unchanged air-fluid levels within the bilateral paranasal sinuses. The mastoid air cells are clear. Other: Partially visualized endotracheal and nasogastric tubes. IMPRESSION: 1. New hypodensity and loss of gray-white matter differentiation in the right insula and operculum, concerning for acute infarct. Additional focal hypodensity within the left internal capsule is also suspicious for infarct. These could be related to hypoperfusion given the  clinical history of cardiac arrest, however they are not the typical watershed infarct zones. 2. Unchanged acute pansinusitis. Radiology assistant personnel have been notified to put me in telephone contact with the referring physician or the referring physician's clinical representative in order to discuss these findings. Once this communication is established I will issue an addendum to this report for documentation purposes. Electronically Signed: By: Titus Dubin M.D. On: 04/23/2017 16:29   Mr Brain Wo Contrast  Result Date: 04/24/2017 CLINICAL DATA:  Encephalopathy. EXAM: MRI HEAD WITHOUT CONTRAST TECHNIQUE: Multiplanar, multiecho pulse sequences of the brain and surrounding structures were obtained without intravenous contrast. COMPARISON:  CT head without contrast 04/23/2017. FINDINGS: Brain: Diffusion-weighted images is demonstrate extensive acute/subacute infarction. There are confluent cortical nonhemorrhagic infarcts involving the right temporal lobe and frontal operculum extending into the sylvian fissure and along the central sulcus. There is involvement of the medial frontal lobes, right greater than left. More scattered areas of cortical infarction are present in the left hemisphere. There is diffuse involvement with globus pallidus bilaterally with profound restricted diffusion. A focal area of restricted diffusion is present laterally in the left thalamus and more centrally in the right thalamus. A single punctate focus of restricted diffusion is present in the anterior right pons. A remote lacunar infarct is present in the posteromedial right cerebellum measuring 14 mm. Other punctate foci are present in the cerebellum bilaterally. There is a focus in the right occipital lobe. These areas all demonstrate increased T2 signal compatible with a subacute time frame. There is some susceptibility in the high left frontal convexity which may represent some hemorrhage. Ventricles are of normal  size. No significant extra-axial fluid collection is present. Internal auditory canals are within normal limits. Vascular: Flow is present in the major intracranial arteries. Skull and upper cervical spine: The skullbase is within normal limits. Patient is intubated. NG tube is in place. Sinuses/Orbits: Fluid levels are present in the nasopharynx. There fluid levels in the sphenoid sinuses and maxillary sinuses bilaterally. Bilateral mastoid effusions are present. Globes and orbits are within normal limits. IMPRESSION: 1. 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 Some of this reflects the sequela of a profound diffuse anoxic injury. The more peripheral scattered punctate cortical and cerebellar ischemia is atypical of hypoxia. A central embolic source such as carotid artery disease or cardiac thrombus were septal perforation may be related with diffuse embolic disease. 2. Confluent focus of restricted diffusion and signal abnormality within the anteromedial right temporal lobe. This may be secondary to seizure focus with source of seizure. 3. Minimal susceptibility over the high left frontal convexity may  represent some petechial hemorrhage. 4. Extensive sinus disease related to intubation and NG tube. These results were called by telephone at the time of interpretation on 04/24/2017 at 3:21 pm to Dr. Titus Mould, who verbally acknowledged these results. Electronically Signed   By: San Morelle M.D.   On: 04/24/2017 15:21   Dg Chest Port 1 View  Result Date: 04/24/2017 CLINICAL DATA:  Central line placement. EXAM: PORTABLE CHEST 1 VIEW COMPARISON:  Radiograph of same day. FINDINGS: Endotracheal and nasogastric tubes are unchanged in position. Right-sided PICC line is unchanged in position. Interval placement of right internal jugular dialysis catheter with distal tip in expected position of SVC. Stable  cardiomediastinal silhouette. Stable bilateral patchy airspace opacities are noted concerning for inflammation or edema. Bony thorax is unremarkable. IMPRESSION: Stable support apparatus. Interval placement of right internal jugular dialysis catheter with distal tip in expected position of the SVC. Stable bilateral lung opacities are noted. Electronically Signed   By: Marijo Conception, M.D.   On: 04/24/2017 15:54   Dg Chest Port 1 View  Result Date: 04/24/2017 CLINICAL DATA:  Intubated EXAM: PORTABLE CHEST 1 VIEW COMPARISON:  04/23/2017 FINDINGS: Support devices are stable. Multifocal airspace opacities are again noted, minimally improved compatible with multifocal pneumonia. Heart is normal size. No effusions. IMPRESSION: Slight improvement in multifocal bilateral airspace opacity/pneumonia. Electronically Signed   By: Rolm Baptise M.D.   On: 04/24/2017 09:29   Dg Chest Port 1 View  Result Date: 04/23/2017 CLINICAL DATA:  Hypoxia EXAM: PORTABLE CHEST 1 VIEW COMPARISON:  April 22, 2017 FINDINGS: Endotracheal tube tip is 4.2 cm above the carina. Central catheter tip is in the superior vena cava. Nasogastric tube tip and side port are below the diaphragm. No pneumothorax. There has been considerable clearing of airspace consolidation from the left lung compared to 1 day prior. There is patchy alveolar opacity throughout both lungs currently. There is no new opacity. Heart size and pulmonary vascularity are normal. No adenopathy. No bone lesions. IMPRESSION: One shadow positions as described without pneumothorax. Multifocal airspace opacity, likely pneumonia, with considerably less consolidation on the left compared to 1 day prior. There may be a degree of superimposed noncardiogenic edema and/or ARDS. More than one of these entities may exist concurrently. Electronically Signed   By: Lowella Grip III M.D.   On: 04/23/2017 15:02   Medications: . insulin (NOVOLIN-R) infusion Stopped (04/25/17 0600)   . levETIRAcetam Stopped (04/24/17 2244)  . piperacillin-tazobactam (ZOSYN)  IV Stopped (04/25/17 0521)  . dialysis replacement fluid (prismasate) 300 mL/hr at 04/24/17 1836  . dialysis replacement fluid (prismasate) 200 mL/hr at 04/24/17 1827  . dialysate (PRISMASATE) 1,000 mL/hr at 04/25/17 0510  . sodium chloride     . atorvastatin  40 mg Per Tube q1800  . chlorhexidine gluconate (MEDLINE KIT)  15 mL Mouth Rinse BID  . feeding supplement (PRO-STAT SUGAR FREE 64)  30 mL Per Tube BID  . feeding supplement (VITAL HIGH PROTEIN)  1,000 mL Per Tube Q24H  . heparin subcutaneous  5,000 Units Subcutaneous Q12H  . mouth rinse  15 mL Mouth Rinse 10 times per day  . methylPREDNISolone (SOLU-MEDROL) injection  20 mg Intravenous Daily  . pantoprazole (PROTONIX) IV  40 mg Intravenous Q24H    Background: 46 y.o. year-old AAM PMH DM, HTN, medical noncompliance. Presented to [facility] following seizure at home, then in [facility] ED recurrent w/bradycarda into 30's requiring brief CPR, intubation for airway protection with possible aspiration and now ARDS picture. Marked hyperglycemia. Shock,  progressive hypoxemia, fever and brief need for pressors. Shock liver. Transferred here to [facility] service for management. Baseline creatinine most recently 1.4-1.5 PTA, progressive rise to 7.7 with oliguria. Asked to see for RRT  Assessment/Recommendations  1. AKI on CKD 3 - ischemic ATN (seizures, CPR, shock w/brief pressor requirement).  1. CRRT (300/200/1000/all 4K/no heparin) initiated 11/21 and to be continued with other support measures for 48 hours pending evolution/clarification of neurologic status 2. Increase fluid removal goal 100-150/hour (or higher as needed) to achieve CVP 8-10 3. Continue all 4K fluids 4. ^ DFR 1500 for clearance 2. Hyperkalemia - persistent; ^ DFR 1500/hour 3. Acute hypoxemic resp failure post sz/aspiration pneumonitis/bilat airspace opacities - per CCM. Vent support. Steroids. ATB's for  PNA 4. Shock, s/p brief CPR and pressors. Now HYPERtensive. ^ fluid removal. 5. Shock liver with transaminasitis 6. DM per CCM 7. Encephalopathy - assessing potential for neuro recovery from stroke vs anoxic brain injury. Severe and multifocal changes by MRI.   Ricardo Maes, MD [facility] [phone] pager 04/25/2017, 7:41 AM

## 2017-04-26 ENCOUNTER — Inpatient Hospital Stay (HOSPITAL_COMMUNITY): Payer: BLUE CROSS/BLUE SHIELD

## 2017-04-26 LAB — CBC WITH DIFFERENTIAL/PLATELET
Basophils Absolute: 0 10*3/uL (ref 0.0–0.1)
Basophils Relative: 0 %
EOS ABS: 0 10*3/uL (ref 0.0–0.7)
EOS PCT: 0 %
HCT: 32.6 % — ABNORMAL LOW (ref 39.0–52.0)
Hemoglobin: 11.4 g/dL — ABNORMAL LOW (ref 13.0–17.0)
LYMPHS ABS: 1.2 10*3/uL (ref 0.7–4.0)
LYMPHS PCT: 5 %
MCH: 28.1 pg (ref 26.0–34.0)
MCHC: 35 g/dL (ref 30.0–36.0)
MCV: 80.5 fL (ref 78.0–100.0)
MONOS PCT: 3 %
Monocytes Absolute: 0.6 10*3/uL (ref 0.1–1.0)
Neutro Abs: 21.1 10*3/uL — ABNORMAL HIGH (ref 1.7–7.7)
Neutrophils Relative %: 92 %
PLATELETS: 195 10*3/uL (ref 150–400)
RBC: 4.05 MIL/uL — ABNORMAL LOW (ref 4.22–5.81)
RDW: 15.1 % (ref 11.5–15.5)
WBC: 22.9 10*3/uL — ABNORMAL HIGH (ref 4.0–10.5)

## 2017-04-26 LAB — PHOSPHORUS
PHOSPHORUS: 5 mg/dL — AB (ref 2.5–4.6)
PHOSPHORUS: 3.6 mg/dL (ref 2.5–4.6)

## 2017-04-26 LAB — COMPREHENSIVE METABOLIC PANEL
ALBUMIN: 2.2 g/dL — AB (ref 3.5–5.0)
ALT: 288 U/L — AB (ref 17–63)
AST: 295 U/L — AB (ref 15–41)
Alkaline Phosphatase: 85 U/L (ref 38–126)
Anion gap: 14 (ref 5–15)
BUN: 56 mg/dL — AB (ref 6–20)
CHLORIDE: 101 mmol/L (ref 101–111)
CO2: 24 mmol/L (ref 22–32)
CREATININE: 5.37 mg/dL — AB (ref 0.61–1.24)
Calcium: 7.8 mg/dL — ABNORMAL LOW (ref 8.9–10.3)
GFR calc Af Amer: 13 mL/min — ABNORMAL LOW (ref >60)
GFR calc non Af Amer: 11 mL/min — ABNORMAL LOW (ref >60)
GLUCOSE: 174 mg/dL — AB (ref 65–99)
Potassium: 4.2 mmol/L (ref 3.5–5.1)
SODIUM: 139 mmol/L (ref 135–145)
Total Bilirubin: 1 mg/dL (ref 0.3–1.2)
Total Protein: 7.6 g/dL (ref 6.5–8.1)

## 2017-04-26 LAB — GLUCOSE, CAPILLARY
GLUCOSE-CAPILLARY: 193 mg/dL — AB (ref 65–99)
GLUCOSE-CAPILLARY: 152 mg/dL — AB (ref 65–99)
GLUCOSE-CAPILLARY: 171 mg/dL — AB (ref 65–99)
GLUCOSE-CAPILLARY: 181 mg/dL — AB (ref 65–99)
GLUCOSE-CAPILLARY: 190 mg/dL — AB (ref 65–99)
GLUCOSE-CAPILLARY: 169 mg/dL — AB (ref 65–99)

## 2017-04-26 LAB — BLOOD GAS, ARTERIAL
Acid-base deficit: 2 mmol/L (ref 0.0–2.0)
Bicarbonate: 22.2 mmol/L (ref 20.0–28.0)
DRAWN BY: 347621
FIO2: 40
MECHVT: 510 mL
O2 Saturation: 91.5 %
PEEP: 5 cmH2O
PO2 ART: 68.1 mmHg — AB (ref 83.0–108.0)
Patient temperature: 98.7
RATE: 24 resp/min
pCO2 arterial: 38 mmHg (ref 32.0–48.0)
pH, Arterial: 7.385 (ref 7.350–7.450)

## 2017-04-26 LAB — MAGNESIUM
MAGNESIUM: 2.7 mg/dL — AB (ref 1.7–2.4)
Magnesium: 2.7 mg/dL — ABNORMAL HIGH (ref 1.7–2.4)

## 2017-04-26 LAB — RENAL FUNCTION PANEL
Albumin: 2.2 g/dL — ABNORMAL LOW (ref 3.5–5.0)
Anion gap: 11 (ref 5–15)
BUN: 52 mg/dL — ABNORMAL HIGH (ref 6–20)
CHLORIDE: 102 mmol/L (ref 101–111)
CO2: 24 mmol/L (ref 22–32)
CREATININE: 4.52 mg/dL — AB (ref 0.61–1.24)
Calcium: 7.8 mg/dL — ABNORMAL LOW (ref 8.9–10.3)
GFR calc non Af Amer: 14 mL/min — ABNORMAL LOW (ref >60)
GFR, EST AFRICAN AMERICAN: 16 mL/min — AB (ref >60)
Glucose, Bld: 202 mg/dL — ABNORMAL HIGH (ref 65–99)
POTASSIUM: 4.3 mmol/L (ref 3.5–5.1)
Phosphorus: 3.6 mg/dL (ref 2.5–4.6)
Sodium: 137 mmol/L (ref 135–145)

## 2017-04-26 MED ORDER — PRO-STAT SUGAR FREE PO LIQD
60.0000 mL | Freq: Three times a day (TID) | ORAL | Status: DC
  Administered 2017-04-26 – 2017-04-28 (×6): 60 mL
  Filled 2017-04-26 (×7): qty 60

## 2017-04-26 MED ORDER — SODIUM CHLORIDE 0.9 % IV SOLN
3.0000 g | Freq: Three times a day (TID) | INTRAVENOUS | Status: DC
  Administered 2017-04-26 – 2017-04-28 (×7): 3 g via INTRAVENOUS
  Filled 2017-04-26 (×8): qty 3

## 2017-04-26 MED ORDER — LABETALOL HCL 100 MG PO TABS
100.0000 mg | ORAL_TABLET | Freq: Two times a day (BID) | ORAL | Status: DC
  Administered 2017-04-26 – 2017-04-28 (×5): 100 mg
  Filled 2017-04-26 (×5): qty 1

## 2017-04-26 MED ORDER — INSULIN GLARGINE 100 UNIT/ML ~~LOC~~ SOLN
25.0000 [IU] | SUBCUTANEOUS | Status: DC
  Administered 2017-04-26 – 2017-04-27 (×2): 25 [IU] via SUBCUTANEOUS
  Filled 2017-04-26 (×3): qty 0.25

## 2017-04-26 MED ORDER — CHLORHEXIDINE GLUCONATE CLOTH 2 % EX PADS
6.0000 | MEDICATED_PAD | Freq: Every day | CUTANEOUS | Status: DC
  Administered 2017-04-26 – 2017-04-27 (×3): 6 via TOPICAL

## 2017-04-26 NOTE — Progress Notes (Signed)
Nutrition Follow-up  INTERVENTION:   Continue:  Vital High Protein @ 40 ml/hr (960 ml/day)  Increase Prostat to: 60 ml Prostat TID  Provides: 1560 kcal, 174 grams protein, and 802 ml free water.   NUTRITION DIAGNOSIS:   Inadequate oral intake related to inability to eat as evidenced by NPO status. Ongoing.   GOAL:   Provide needs based on ASPEN/SCCM guidelines Progressing.   MONITOR:   I & O's, Vent status  REASON FOR ASSESSMENT:   Consult Enteral/tube feeding initiation and management  ASSESSMENT:   Pt with PMH of HTN, DM, Sz d/o admitted after seizure and fall at home, brief CPR, large emesis with possible aspiration.   Patient is currently intubated on ventilator support CRRT for fluid removal, removed 4 L last 24 hrs Per neruo and CCM, poor prognosis, mom considering withdrawal.   Medications reviewed and include: lantus Labs reviewed: PO4: 5 (H), mangnesium: 2.7 (H), AST: 295 (H), ALT: 288 (H)   11/22 TF started: Vital High Protein @ 40 ml/hr with 30 ml Prostat BID: 1160 kcal, 114 grams protein  Diet Order:  Diet NPO time specified  EDUCATION NEEDS:   No education needs have been identified at this time  Skin:  Skin Assessment: Reviewed RN Assessment  Last BM:  11/22  Height:   Ht Readings from Last 1 Encounters:  04/23/17 5\' 10"  (1.778 m)    Weight:   Wt Readings from Last 1 Encounters:  04/26/17 257 lb 0.9 oz (116.6 kg)    Ideal Body Weight:  75.4 kg  BMI:  Body mass index is 36.88 kg/m.  Estimated Nutritional Needs:   Kcal:  1300-1700  Protein:  > 150 grams  Fluid:  > 2 L/day  Kendell BaneHeather Zayonna Ayuso RD, LDN, CNSC [phone] Pager [phone] After Hours Pager

## 2017-04-26 NOTE — Progress Notes (Signed)
PULMONARY / CRITICAL CARE MEDICINE   Name: Ricardo Rogers MRN: [id number] DOB: [birth year]    ADMISSION DATE:  March 13, 2017 CONSULTATION DATE:  04/23/2017  REFERRING MD:  [name]  CHIEF COMPLAINT:  Seizure  HISTORY OF PRESENT ILLNESS:  HPI obtained from medical chart review and from patient's mother at the bedside.     46 year old male with past medical history of seizures (not on AED), hypertension, diabetes, and childhood asthma who [facility] by EMS with reported seizure like activity.  Mother witnessed seizure at home following fall from standing position in kitchen, unsure any head trauma.   Patient initially combative/ post-ictal in ER and then had another witnessed seizure followed by bradycardia into the 30's.  CPR briefly done, possibly 3 mins, and then intubated for airway protection.  During intubation, patient vomited large amounts of liquid like grape juice (patient drinks grape soda frequently at home) despite RSI with paralytics with probable aspiration.  Patient did require sedation afterwards for movements.  Initial glucose 498 up to 764 in ER.  Reportedly, last seizure was 1 year ago in the setting of hyperglycemia; no prior seizure history.  Patient is not compliant with any of his medications or diet.  Mother denies any recent illness or complaints from patient.  [facility] at [facility]  Placed continuous sedation, glucose stabilizer and neurology consulted.  Patient with hypotension requiring fluid boluses and placement of PICC line for poor venous access.   Patient loaded with keppra and seizures thought attributed to hyperglycemia.  CXR with progressive worsening of bilateral airspace disease after aspirating requiring increased PEEP and FiO2 on mechanical ventilation.  Started on empiric Unasyn.  Overnight, he developed fever, oliguira, and required vasopressor support.  [facility], PCCM to accept and manage.   SUBJECTIVE:  cvvhd on going Bp controlled Not a lot of progress neuro wise  VITAL SIGNS: BP (!) 153/94   Pulse 94   Temp 98.3 F (36.8 C) (Axillary)   Resp (!) 23   Ht 5\' 10"  (1.778 m)   Wt 116.6 kg (257 lb 0.9 oz)   SpO2 97%   BMI 36.88 kg/m   HEMODYNAMICS: CVP:  [10 mmHg-19 mmHg] 12 mmHg  VENTILATOR SETTINGS: Vent Mode: PRVC FiO2 (%):  [30 %-50 %] 40 % Set Rate:  [24 bmp] 24 bmp Vt Set:  [510 mL] 510 mL PEEP:  [5 cmH20] 5 cmH20 Plateau Pressure:  [13 cmH20-26 cmH20] 16 cmH20  INTAKE / OUTPUT: I/O last 3 completed shifts: In: 1742.1 [I.V.:46.4; Other:275; NG/GT:910.7; IV Piggyback:510] Out: 6283 [Urine:80; Emesis/NG output:100; Other:6103]  PHYSICAL EXAMINATION: General: not fc Neuro: not opening eyes spont, does open to pain, WD pain HEENT: ett, line clean PULM: ronchi CV: s1s2 RRR GI: soft, BS wnl, no r, lose stools noted Extremities: edema    LABS:  BMET Recent Labs  Lab 04/24/17 0432 04/25/17 0408 04/26/17 0609  NA 140 141 139  K 5.5* 5.1 4.2  CL 111 108 101  CO2 20* 21* 24  BUN 45* 57* 56*  CREATININE 7.71* 7.67* 5.37*  GLUCOSE 142* 130* 174*    Electrolytes Recent Labs  Lab 04/24/17 0432 04/25/17 0408 04/25/17 0840 04/25/17 1739 04/26/17 0609  CALCIUM 7.1* 7.7*  --   --  7.8*  MG  --  2.5* 2.5* 2.4 2.7*  PHOS  --  6.6* 6.3* 5.3* 5.0*  CBC Recent Labs  Lab 04/24/17 0432 04/25/17 0408 04/26/17 0609  WBC 17.1* 14.2* 22.9*  HGB 11.8* 13.5 11.4*  HCT 34.7* 38.9* 32.6*  PLT 192 171 195    Coag's Recent Labs  Lab 04/24/17 1000  APTT 32  INR 1.39    Sepsis Markers Recent Labs  Lab 2017-04-24 1859 04/23/17 1523 04/23/17 2315 04/24/17 0432 04/25/17 0408  LATICACIDVEN 5.0* 1.7 1.6  --   --   PROCALCITON  --  >150.00  --  >150.00 132.07    ABG Recent Labs  Lab 04/24/17 0825 04/25/17 0853 04/26/17 0405  PHART 7.244* 7.438 7.385  PCO2ART 38.0 30.7*  38.0  PO2ART 136* 136.0* 68.1*    Liver Enzymes Recent Labs  Lab 04/23/17 2315 04/24/17 0432 04/25/17 0408 04/26/17 0609  AST 531* 580*  --  295*  ALT 264* 294*  --  288*  ALKPHOS 59 67  --  85  BILITOT 1.0 1.0  --  1.0  ALBUMIN 2.3* 2.2* 2.1* 2.2*    Cardiac Enzymes Recent Labs  Lab 04/24/2017 0911  TROPONINI <0.03    Glucose Recent Labs  Lab 04/25/17 0713 04/25/17 1130 04/25/17 1606 04/25/17 1921 04/25/17 2325 04/26/17 0324  GLUCAP 180* 150* 204* 215* 220* 193*    Imaging Dg Chest Port 1 View  Result Date: 04/26/2017 CLINICAL DATA:  46 y/o male who presented with status epilepticus in the setting of hyperosmolar hyperglycemic state, cardiac arrest and subsequent evidence on Brain MRI of anoxic injury. EXAM: PORTABLE CHEST 1 VIEW COMPARISON:  04/24/2017 and earlier. FINDINGS: Portable AP semi upright view at 0553 hours. Endotracheal tube tip now projects just above the clavicles. Stable right IJ tool lumen central line or a tool catheters superimposed on right PICC line. Enteric tube side hole at the level of the stomach. Lung volumes and mediastinal contours remain normal. Patchy and nodular basilar predominant bilateral pulmonary opacity persists. Upper lobe involvement is mildly regressed in the last 2 days. No pneumothorax, pleural effusion or consolidation identified. IMPRESSION: 1. Endotracheal tube tip now on just above the clavicles. Otherwise stable lines and tubes. 2. Mildly improved bilateral upper lung ventilation over the past 2 days with continued mid and lower lung patchy and nodular opacity suspicious for bilateral respiratory infection. No pleural effusion is evident. Electronically Signed   By: Odessa Fleming M.D.   On: 04/26/2017 07:19   STUDIES:  CT head 11/19 >> no acute intracranial abnormality; stable cerebral atrophy; acute pansinusitis  CT head 11/20 >> New hypodensity and loss of gray-white matter differentiation in the right insula and operculum,  concerning for acute infarct.  Additional focal hypodensity within the left internal capsule is also suspicious for infarct. These could be related to hypoperfusion given the clinical history of cardiac arrest, however they are not the typical watershed infarct zones.  Unchanged acute pansinusitis. MRi 11/21>>>Extensive areas of acute/subacute nonhemorrhagic infarct throughout the brain with involvement of the globus pallidus bilaterally. Some of this reflects the sequela of a profound diffuse anoxic injury. The more peripheral scattered punctate cortical and cerebellar ischemia is atypical of hypoxia. A central embolic source such as carotid artery disease or cardiac thrombus were septal perforation may be related with diffuse embolic disease. 2. Confluent focus of restricted diffusion and signal abnormality within the anteromedial right temporal lobe. This may be secondary to seizure focus with source of seizure.  CULTURES: MRSA 11/19>> neg Sputum Cx 11/19 >>multiple  BC x 2 11/19 >>1/2 coag neg staph>>>  ANTIBIOTICS: 11/20 Unasyn >>  11/20 11/20 Vanc>>11.22 11/20 Zosyn >>  SIGNIFICANT EVENTS: 11/19  Admit to [facility] 11/20  Transfer to [facility]  LINES/TUBES: 11/19 ETT 11/19 L NGT 11/19 R PICC >>  DISCUSSION: [age] yoM w/PMH uncontrolled HTN and diabetes and hx of seizure (seizures associated w/ hyperglycemia) with witnessed seizures that became bradycardic in ER while postictal, requiring brief CPR, vomiting during intubation, with progressive renal failure, hypotension, worsening infiltrates.    ASSESSMENT / PLAN:  PULMONARY A: Acute hypoxic respiratory failure following seizure/ emesis Aspiration pneumonitis r/o pneumonia/ ARDS Hx Asthma - CXR 11/19 with development of bilateral opacities L > R P:   advace ett , it is higjh pcxr clearing with neg balance, was neg 2.5 liters, keep cvvhd if maintain support pcxr in am  abg reviewed, keep same MV, keep same rate and remain on  ards On low O2 needs, SBT attempt unable to extubate unless for comfort care with neurostatus Plat less 30   CARDIOVASCULAR A:  Shock- presumed septic vs related to sedation Brief CPR- ?3mins following bradycardia/ seizure P:  Tele monitoring labetalol successful, start oral Keep neg cvvhd  RENAL A:   AKI on CKD (previous sCr 1.46 in 5/16)- oliguria w/ CVP of 16 Hyperkalemia- stable, no QRS changes P:   cvvhd Was neg , improved pulm with neg balance  GASTROINTESTINAL A:   NPO Transaminitis - possibly shock liver  P:   lft in am , resolving tolerating TF   HEMATOLOGIC A:   Leukocytosis  P:  Trend CBC with wbc, some of the wbc may be from neg balance SCDs/ heparin for VTE ppx   INFECTIOUS A:   Aspiration pneumonia/ pneumonitis  / MODS - PCT > 150 P:   Pct down BC is contamination, repeat in am if maintain aggressive care Consider narrow to unasyn  ENDOCRINE A:   Uncontrolled DM, ?possible HHNK P:   CBG q 4  Increase lantus Dc steroids  NEUROLOGIC A:   Acute encephalopathy - possibly multifactorial- ddx anoxic injury vs toxic (not clearing previous sedation with AKI) vs CVA  - CT head 11/20 concerning for acute CVA vs hypoxic injury, pattern not typical for watershed infarct MRI brain - anoxia, concern emboli - EEG 11/20 neg for epileptiform discharges; severe generalized slowing pattern Seizure- hx of, on no home meds P:   RASS goal:0 Will discuss prognosis with neuro and make some decisions in regauards to comfort care? Family clear, they do not want to continue if no quality  FAMILY  - Updates: Mother and sister updated at bedside 11/20.  No family at bedside 11/21,  - Inter-disciplinary family meet or Palliative Care meeting due by:  [facility]  Ccm time 35 min   Mcarthur Rossettianiel J. Tyson AliasFeinstein, MD, FACP Pgr: [phone] [facility] 04/26/2017 7:48 AM

## 2017-04-26 NOTE — Progress Notes (Signed)
[facility] Rounding Note  Subjective/Interval History:  Non responsive No sedation Neuro note reviewed - "poor prognosis for meaningful neurologicalrecovery, given minimal clinical improvement and the MRI findings".  CRRT (04/24/17) Filter clotted twice past 24 hurs [facility]ret Rx all 4K/300/200/1500/no heparin/neg 50-150 goal CVP 8-10  Objective Vital signs in last 24 hours: Vitals:   04/26/17 0500 04/26/17 0530 04/26/17 0600 04/26/17 0610  BP: (!) 152/97 (!) 156/93 (!) 153/94   Pulse: 92 93 96 94  Resp: (!) 31 (!) 29 (!) 29 (!) 23  Temp:      TempSrc:      SpO2: 94% 97% 95% 97%  Weight:      Height:       Weight change: -6.1 kg (-7.2 oz)  Intake/Output Summary (Last 24 hours) at 04/26/2017 0643 Last data filed at 04/26/2017 0543 Gross per 24 hour  Intake 1345.67 ml  Output 3845 ml  Net -2499.33 ml   Physical Exam:  Blood pressure (!) 153/94, pulse 94, temperature 98.3 F (36.8 C), temperature source Axillary, resp. rate (!) 23, height 5' 10"  (1.778 m), weight 116.6 kg (257 lb 0.9 oz), SpO2 97 %.  Gen: intubated, unresponsive CVP 12 Lines/tubes: ETT, foley, R PICC, NGT, R IJ trialysis catheter (11/21) Neck: Line in place Chest: Coarse BS, clear Heart: Regular. S4 S1 S2 Abdomen: soft, mild distension,  faint BS Ext: No LE edema Neuro: Unresponsive to painful stimuli (to me) Dialysis Access:  R IJ Trialysis (11/21)  Lab for today still in progress  Recent Labs  Lab 04/23/17 0025 04/23/17 0201 04/23/17 0623 04/23/17 1523 04/23/17 2315 04/24/17 0432 04/25/17 0408 04/25/17 0840 04/25/17 1739  NA 135 146* 141 134* 139 140 141  --   --   K 2.6* 4.3 5.5* 5.4* 5.0 5.5* 5.1  --   --   CL 112* 116* 110 107 110 111 108  --   --   CO2 19* 25 24 18* 20* 20* 21*  --   --   GLUCOSE 764* 145* 157* 697* 142* 142* 130*  --   --   BUN 18 24* 26* 29* 39* 45* 57*  --   --   CREATININE 2.91* 3.87* 4.66* 5.30* 6.96* 7.71* 7.67*  --   --   CALCIUM 5.6* 8.0* 7.5* 6.1* 6.9* 7.1* 7.7*   --   --   PHOS  --   --   --  4.5  --   --  6.6* 6.3* 5.3*    Recent Labs  Lab 04/16/2017 0908  04/23/17 2315 04/24/17 0432 04/25/17 0408  AST 25  --  531* 580*  --   ALT 26  --  264* 294*  --   ALKPHOS 114  --  59 67  --   BILITOT 0.8  --  1.0 1.0  --   PROT 8.4*  --  5.8* 5.9*  --   ALBUMIN 4.0   < > 2.3* 2.2* 2.1*   < > = values in this interval not displayed.    Recent Labs  Lab 04/08/2017 0908  04/23/17 0201 04/24/17 0432 04/25/17 0408 04/26/17 0609  WBC 18.9*  --  18.6* 17.1* 14.2* 22.9*  NEUTROABS 13.9*  --   --   --   --  21.1*  HGB 15.5   < > 13.4 11.8* 13.5 11.4*  HCT 44.7   < > 40.9 34.7* 38.9* 32.6*  MCV 80.7  --  85.7 83.2 81.2 80.5  PLT 296  --  210 192  171 195   < > = values in this interval not displayed.    Recent Labs  Lab 04/06/2017 0911  TROPONINI <0.03    Recent Labs  Lab 04/25/17 1130 04/25/17 1606 04/25/17 1921 04/25/17 2325 04/26/17 0324  GLUCAP 150* 204* 215* 220* 193*    Studies/Results: Mr Brain Wo Contrast  Result Date: 04/24/2017 CLINICAL DATA:  Encephalopathy. EXAM: MRI HEAD WITHOUT CONTRAST TECHNIQUE: Multiplanar, multiecho pulse sequences of the brain and surrounding structures were obtained without intravenous contrast. COMPARISON:  CT head without contrast 04/23/2017. FINDINGS: Brain: Diffusion-weighted images is demonstrate extensive acute/subacute infarction. There are confluent cortical nonhemorrhagic infarcts involving the right temporal lobe and frontal operculum extending into the sylvian fissure and along the central sulcus. There is involvement of the medial frontal lobes, right greater than left. More scattered areas of cortical infarction are present in the left hemisphere. There is diffuse involvement with globus pallidus bilaterally with profound restricted diffusion. A focal area of restricted diffusion is present laterally in the left thalamus and more centrally in the right thalamus. A single punctate focus of  restricted diffusion is present in the anterior right pons. A remote lacunar infarct is present in the posteromedial right cerebellum measuring 14 mm. Other punctate foci are present in the cerebellum bilaterally. There is a focus in the right occipital lobe. These areas all demonstrate increased T2 signal compatible with a subacute time frame. There is some susceptibility in the high left frontal convexity which may represent some hemorrhage. Ventricles are of normal size. No significant extra-axial fluid collection is present. Internal auditory canals are within normal limits. Vascular: Flow is present in the major intracranial arteries. Skull and upper cervical spine: The skullbase is within normal limits. Patient is intubated. NG tube is in place. Sinuses/Orbits: Fluid levels are present in the nasopharynx. There fluid levels in the sphenoid sinuses and maxillary sinuses bilaterally. Bilateral mastoid effusions are present. Globes and orbits are within normal limits. IMPRESSION: 1. 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 Some of this reflects the sequela of a profound diffuse anoxic injury. The more peripheral scattered punctate cortical and cerebellar ischemia is atypical of hypoxia. A central embolic source such as carotid artery disease or cardiac thrombus were septal perforation may be related with diffuse embolic disease. 2. Confluent focus of restricted diffusion and signal abnormality within the anteromedial right temporal lobe. This may be secondary to seizure focus with source of seizure. 3. Minimal susceptibility over the high left frontal convexity may represent some petechial hemorrhage. 4. Extensive sinus disease related to intubation and NG tube. These results were called by telephone at the time of interpretation on 04/24/2017 at 3:21 pm to Dr. Titus Mould, who verbally acknowledged these results.  Electronically Signed   By: San Morelle M.D.   On: 04/24/2017 15:21   Dg Chest Port 1 View  Result Date: 04/24/2017 CLINICAL DATA:  Central line placement. EXAM: PORTABLE CHEST 1 VIEW COMPARISON:  Radiograph of same day. FINDINGS: Endotracheal and nasogastric tubes are unchanged in position. Right-sided PICC line is unchanged in position. Interval placement of right internal jugular dialysis catheter with distal tip in expected position of SVC. Stable cardiomediastinal silhouette. Stable bilateral patchy airspace opacities are noted concerning for inflammation or edema. Bony thorax is unremarkable. IMPRESSION: Stable support apparatus. Interval placement of right internal jugular dialysis catheter with distal tip in expected position of the SVC. Stable bilateral lung opacities are noted. Electronically Signed   By:  Marijo Conception, M.D.   On: 04/24/2017 15:54   Dg Chest Port 1 View  Result Date: 04/24/2017 CLINICAL DATA:  Intubated EXAM: PORTABLE CHEST 1 VIEW COMPARISON:  04/23/2017 FINDINGS: Support devices are stable. Multifocal airspace opacities are again noted, minimally improved compatible with multifocal pneumonia. Heart is normal size. No effusions. IMPRESSION: Slight improvement in multifocal bilateral airspace opacity/pneumonia. Electronically Signed   By: Rolm Baptise M.D.   On: 04/24/2017 09:29   Medications: . dextrose    . levETIRAcetam Stopped (04/25/17 2126)  . piperacillin-tazobactam (ZOSYN)  IV Stopped (04/26/17 0543)  . dialysis replacement fluid (prismasate) 300 mL/hr at 04/26/17 0618  . dialysis replacement fluid (prismasate) 200 mL/hr at 04/25/17 1204  . dialysate (PRISMASATE) 1,500 mL/hr at 04/26/17 0415  . sodium chloride 999 mL/hr at 04/25/17 2230   . chlorhexidine gluconate (MEDLINE KIT)  15 mL Mouth Rinse BID  . Chlorhexidine Gluconate Cloth  6 each Topical Q0600  . feeding supplement (PRO-STAT SUGAR FREE 64)  30 mL Per Tube BID  . feeding supplement (VITAL  HIGH PROTEIN)  1,000 mL Per Tube Q24H  . heparin subcutaneous  5,000 Units Subcutaneous Q12H  . insulin aspart  0-20 Units Subcutaneous Q4H  . insulin aspart  4 Units Subcutaneous Q4H  . insulin glargine  20 Units Subcutaneous Q24H  . mouth rinse  15 mL Mouth Rinse 10 times per day  . methylPREDNISolone (SOLU-MEDROL) injection  20 mg Intravenous Daily  . pantoprazole (PROTONIX) IV  40 mg Intravenous Q24H    Background: 46 y.o. year-old AAM PMH DM, HTN, medical noncompliance. Presented to [facility] following seizure at home, then in [facility] ED recurrent w/bradycarda into 30's requiring brief CPR, intubation for airway protection with possible aspiration and now ARDS picture. Marked hyperglycemia. Shock, progressive hypoxemia, fever and brief need for pressors. Shock liver. Transferred here to [facility] service for management. Baseline creatinine most recently 1.4-1.5 PTA, progressive rise to 7.7 with oliguria. Asked to see for RRT  Assessment/Recommendations  1. AKI on CKD 3 - ischemic ATN (seizures, CPR, shock w/brief pressor requirement).  1. CRRT (300/200/1500/all 4K/no heparin) initiated 11/21 and to be continued with other support measures for 48 hours pending evolution/clarification of neurologic status 2. Increased fluid removal goal 100-150/hour (or higher as needed) to achieve CVP 8-10 tolerating 3. Continue all 4K fluids 4. ^ pre filter infusion rate 600/hour (d/t filter clotting) (no heparin due to some areas possi hemorrhagic change MRI) 2. Acute hypoxemic resp failure post sz/aspiration pneumonitis/bilat airspace opacities - per CCM. Vent support. Steroids. ATB's for PNA 3. Shock, s/p brief CPR and pressors. Now HYPERtensive. ^ fluid removal. 4. Shock liver with transaminasitis 5. DM per CCM 6. Encephalopathy - assessing potential for neuro recovery from stroke vs anoxic brain injury. Severe and multifocal changes by MRI. Felt c/w anoxic brain injury with poor prognosis for meaningful recovery  per CCM   Jamal Maes, MD [facility] [phone] pager 04/26/2017, 6:43 AM

## 2017-04-26 NOTE — Progress Notes (Addendum)
Pharmacy Antibiotic Note  Raynelle HighlandJohn W Younce is a 46 y.o. male admitted on 04/06/2017 with seizures - with concern for PNA. Originally started on Unasyn however broadened to Zosyn due to fever. Pharmacy consulted to dose. Pt continues on CRRT since 11/21. Pt is afebrile and WBC has increased to 22.9.   Plan: Continue zosyn 3.375gm IV Q6H F/u renal fxn, C&S, clinical status and LOT  Addendum: Narrowing to unasyn. Will give unasyn 3gm IV Q8H   Height: 5\' 10"  (177.8 cm) Weight: 257 lb 0.9 oz (116.6 kg) IBW/kg (Calculated) : 73  Temp (24hrs), Avg:98.8 F (37.1 C), Min:98.3 F (36.8 C), Max:99.5 F (37.5 C)  Recent Labs  Lab 04/28/2017 0908  04/23/2017 1242 04/28/2017 1859  04/23/17 0201  04/23/17 1523 04/23/17 2315 04/24/17 0432 04/25/17 0408 04/26/17 0609  WBC 18.9*  --   --   --   --  18.6*  --   --   --  17.1* 14.2* 22.9*  CREATININE 1.54*   < >  --   --    < > 3.87*   < > 5.30* 6.96* 7.71* 7.67* 5.37*  LATICACIDVEN  --   --  2.0* 5.0*  --   --   --  1.7 1.6  --   --   --    < > = values in this interval not displayed.    Estimated Creatinine Clearance: 21.5 mL/min (A) (by C-G formula based on SCr of 5.37 mg/dL (H)).    No Known Allergies  Antimicrobials this admission: Unasyn 11/20 x 1 Zosyn 11/20 >>  Dose adjustments this admission: n/a  Microbiology results: 11/19 BCx: 1/2 CoNS 11/19 Sputum: multiple, none predominant 11/19 MRSA PCR: (-)  Lysle Pearlachel Shatora Weatherbee, PharmD, BCPS Phone #: [phone] until 3:30pm All other times, call Main Pharmacy x [phone] 04/26/2017 7:24 AM

## 2017-04-26 NOTE — Progress Notes (Signed)
Subjective: Some improvement in motor activity per nursing, but much of it is posturing.   Objective: Current vital signs: BP (!) 139/92   Pulse (!) 108   Temp 98.5 F (36.9 C) (Axillary)   Resp (!) 27   Ht '5\' 10"'$  (1.778 m)   Wt 116.6 kg (257 lb 0.9 oz)   SpO2 99%   BMI 36.88 kg/m  Vital signs in last 24 hours: Temp:  [98.3 F (36.8 C)-99.5 F (37.5 C)] 98.5 F (36.9 C) (11/23 0800) Pulse Rate:  [81-109] 108 (11/23 1000) Resp:  [19-31] 27 (11/23 1000) BP: (105-180)/(69-105) 139/92 (11/23 1000) SpO2:  [91 %-100 %] 99 % (11/23 1000) FiO2 (%):  [30 %-50 %] 40 % (11/23 0900) Weight:  [116.6 kg (257 lb 0.9 oz)] 116.6 kg (257 lb 0.9 oz) (11/23 0300)  Intake/Output from previous day: 11/22 0701 - 11/23 0700 In: 1590.7 [NG/GT:910.7; IV Piggyback:405] Out: 1610 [Urine:30; Emesis/NG output:50] Intake/Output this shift: Total I/O In: 150 [Other:30; NG/GT:120] Out: 552 [Other:552] Nutritional status: Diet NPO time specified  Neurologic Exam: Mentation:No purposeful movement. No response to voice or attempts to communicate.  RU:EAVWUJ unchanged.No blink to threat.No oculocephalic reflex. Eyelids will open slightly to noxious, right more than left. Motor/Sensory: Flaccid tone x 4.Slight flexion at elbow on the right after prolonged noxious stimulus. Adduction of LUE to noxious most c/w posturing. Stereotyped dorsiflexion of right foot to plantar stimulation. No movement of left leg to noxious. Minimal dorsiflexion of toes bilaterally to plantar stimulation, right with more movement than left.   Lab Results: Results for orders placed or performed during the hospital encounter of 05/01/2017 (from the past 48 hour(s))  Glucose, capillary     Status: Abnormal   Collection Time: 04/24/17 11:06 AM  Result Value Ref Range   Glucose-Capillary 218 (H) 65 - 99 mg/dL   Comment 1 Notify RN    Comment 2 Document in Chart   Glucose, capillary     Status: Abnormal   Collection Time:  04/24/17  3:28 PM  Result Value Ref Range   Glucose-Capillary 260 (H) 65 - 99 mg/dL   Comment 1 Notify RN    Comment 2 Document in Chart   Glucose, capillary     Status: Abnormal   Collection Time: 04/24/17  5:17 PM  Result Value Ref Range   Glucose-Capillary 240 (H) 65 - 99 mg/dL  Glucose, capillary     Status: Abnormal   Collection Time: 04/24/17  6:21 PM  Result Value Ref Range   Glucose-Capillary 288 (H) 65 - 99 mg/dL  Glucose, capillary     Status: Abnormal   Collection Time: 04/24/17  7:31 PM  Result Value Ref Range   Glucose-Capillary 257 (H) 65 - 99 mg/dL  Glucose, capillary     Status: Abnormal   Collection Time: 04/24/17  7:45 PM  Result Value Ref Range   Glucose-Capillary 233 (H) 65 - 99 mg/dL  Glucose, capillary     Status: Abnormal   Collection Time: 04/24/17  8:43 PM  Result Value Ref Range   Glucose-Capillary 231 (H) 65 - 99 mg/dL  Glucose, capillary     Status: Abnormal   Collection Time: 04/24/17  9:53 PM  Result Value Ref Range   Glucose-Capillary 205 (H) 65 - 99 mg/dL  Glucose, capillary     Status: Abnormal   Collection Time: 04/24/17 11:09 PM  Result Value Ref Range   Glucose-Capillary 174 (H) 65 - 99 mg/dL  Glucose, capillary     Status:  Abnormal   Collection Time: 04/24/17 11:57 PM  Result Value Ref Range   Glucose-Capillary 157 (H) 65 - 99 mg/dL  Glucose, capillary     Status: Abnormal   Collection Time: 04/25/17 12:53 AM  Result Value Ref Range   Glucose-Capillary 140 (H) 65 - 99 mg/dL  Glucose, capillary     Status: Abnormal   Collection Time: 04/25/17  1:56 AM  Result Value Ref Range   Glucose-Capillary 129 (H) 65 - 99 mg/dL  Glucose, capillary     Status: Abnormal   Collection Time: 04/25/17  3:15 AM  Result Value Ref Range   Glucose-Capillary 125 (H) 65 - 99 mg/dL  Procalcitonin     Status: None   Collection Time: 04/25/17  4:08 AM  Result Value Ref Range   Procalcitonin 132.07 ng/mL    Comment:        Interpretation: PCT >= 10  ng/mL: Important systemic inflammatory response, almost exclusively due to severe bacterial sepsis or septic shock. (NOTE)         ICU PCT Algorithm               Non ICU PCT Algorithm    ----------------------------     ------------------------------         PCT < 0.25 ng/mL                 PCT < 0.1 ng/mL     Stopping of antibiotics            Stopping of antibiotics       strongly encouraged.               strongly encouraged.    ----------------------------     ------------------------------       PCT level decrease by               PCT < 0.25 ng/mL       >= 80% from peak PCT       OR PCT 0.25 - 0.5 ng/mL          Stopping of antibiotics                                             encouraged.     Stopping of antibiotics           encouraged.    ----------------------------     ------------------------------       PCT level decrease by              PCT >= 0.25 ng/mL       < 80% from peak PCT        AND PCT >= 0.5 ng/mL             Continuing antibiotics                                              encouraged.       Continuing antibiotics            encouraged.    ----------------------------     ------------------------------     PCT level increase compared          PCT > 0.5 ng/mL         with  peak PCT AND          PCT >= 0.5 ng/mL             Escalation of antibiotics                                          strongly encouraged.      Escalation of antibiotics        strongly encouraged.   CBC     Status: Abnormal   Collection Time: 04/25/17  4:08 AM  Result Value Ref Range   WBC 14.2 (H) 4.0 - 10.5 K/uL   RBC 4.79 4.22 - 5.81 MIL/uL   Hemoglobin 13.5 13.0 - 17.0 g/dL   HCT 38.9 (L) 39.0 - 52.0 %   MCV 81.2 78.0 - 100.0 fL   MCH 28.2 26.0 - 34.0 pg   MCHC 34.7 30.0 - 36.0 g/dL   RDW 15.4 11.5 - 15.5 %   Platelets 171 150 - 400 K/uL  Magnesium     Status: Abnormal   Collection Time: 04/25/17  4:08 AM  Result Value Ref Range   Magnesium 2.5 (H) 1.7 - 2.4 mg/dL   Renal function panel     Status: Abnormal   Collection Time: 04/25/17  4:08 AM  Result Value Ref Range   Sodium 141 135 - 145 mmol/L   Potassium 5.1 3.5 - 5.1 mmol/L   Chloride 108 101 - 111 mmol/L   CO2 21 (L) 22 - 32 mmol/L   Glucose, Bld 130 (H) 65 - 99 mg/dL   BUN 57 (H) 6 - 20 mg/dL   Creatinine, Ser 7.67 (H) 0.61 - 1.24 mg/dL   Calcium 7.7 (L) 8.9 - 10.3 mg/dL   Phosphorus 6.6 (H) 2.5 - 4.6 mg/dL   Albumin 2.1 (L) 3.5 - 5.0 g/dL   GFR calc non Af Amer 7 (L) >60 mL/min   GFR calc Af Amer 9 (L) >60 mL/min    Comment: (NOTE) The eGFR has been calculated using the CKD EPI equation. This calculation has not been validated in all clinical situations. eGFR's persistently <60 mL/min signify possible Chronic Kidney Disease.    Anion gap 12 5 - 15  Glucose, capillary     Status: Abnormal   Collection Time: 04/25/17  4:14 AM  Result Value Ref Range   Glucose-Capillary 120 (H) 65 - 99 mg/dL  Glucose, capillary     Status: Abnormal   Collection Time: 04/25/17  5:20 AM  Result Value Ref Range   Glucose-Capillary 123 (H) 65 - 99 mg/dL  Glucose, capillary     Status: Abnormal   Collection Time: 04/25/17  5:56 AM  Result Value Ref Range   Glucose-Capillary 117 (H) 65 - 99 mg/dL  Glucose, capillary     Status: Abnormal   Collection Time: 04/25/17  7:13 AM  Result Value Ref Range   Glucose-Capillary 180 (H) 65 - 99 mg/dL  Magnesium     Status: Abnormal   Collection Time: 04/25/17  8:40 AM  Result Value Ref Range   Magnesium 2.5 (H) 1.7 - 2.4 mg/dL  Phosphorus     Status: Abnormal   Collection Time: 04/25/17  8:40 AM  Result Value Ref Range   Phosphorus 6.3 (H) 2.5 - 4.6 mg/dL  I-STAT 3, arterial blood gas (G3+)     Status: Abnormal   Collection Time: 04/25/17  8:53 AM  Result Value Ref Range   pH, Arterial 7.438 7.350 - 7.450   pCO2 arterial 30.7 (L) 32.0 - 48.0 mmHg   pO2, Arterial 136.0 (H) 83.0 - 108.0 mmHg   Bicarbonate 20.8 20.0 - 28.0 mmol/L   TCO2 22 22 - 32 mmol/L    O2 Saturation 99.0 %   Acid-base deficit 3.0 (H) 0.0 - 2.0 mmol/L   Patient temperature 98.6 F    Collection site RADIAL, ALLEN'S TEST ACCEPTABLE    Drawn by RT    Sample type ARTERIAL   Glucose, capillary     Status: Abnormal   Collection Time: 04/25/17 11:30 AM  Result Value Ref Range   Glucose-Capillary 150 (H) 65 - 99 mg/dL  Glucose, capillary     Status: Abnormal   Collection Time: 04/25/17  4:06 PM  Result Value Ref Range   Glucose-Capillary 204 (H) 65 - 99 mg/dL  Magnesium     Status: None   Collection Time: 04/25/17  5:39 PM  Result Value Ref Range   Magnesium 2.4 1.7 - 2.4 mg/dL  Phosphorus     Status: Abnormal   Collection Time: 04/25/17  5:39 PM  Result Value Ref Range   Phosphorus 5.3 (H) 2.5 - 4.6 mg/dL  Glucose, capillary     Status: Abnormal   Collection Time: 04/25/17  7:21 PM  Result Value Ref Range   Glucose-Capillary 215 (H) 65 - 99 mg/dL  Glucose, capillary     Status: Abnormal   Collection Time: 04/25/17 11:25 PM  Result Value Ref Range   Glucose-Capillary 220 (H) 65 - 99 mg/dL  Glucose, capillary     Status: Abnormal   Collection Time: 04/26/17  3:24 AM  Result Value Ref Range   Glucose-Capillary 193 (H) 65 - 99 mg/dL  Blood gas, arterial     Status: Abnormal   Collection Time: 04/26/17  4:05 AM  Result Value Ref Range   FIO2 40.00    Delivery systems VENTILATOR    Mode PRESSURE REGULATED VOLUME CONTROL    VT 510 mL   LHR 24 resp/min   Peep/cpap 5.0 cm H20   pH, Arterial 7.385 7.350 - 7.450   pCO2 arterial 38.0 32.0 - 48.0 mmHg   pO2, Arterial 68.1 (L) 83.0 - 108.0 mmHg   Bicarbonate 22.2 20.0 - 28.0 mmol/L   Acid-base deficit 2.0 0.0 - 2.0 mmol/L   O2 Saturation 91.5 %   Patient temperature 98.7    Collection site LEFT RADIAL    Drawn by [id number]    Sample type ARTERIAL DRAW    Allens test (pass/fail) PASS PASS  Magnesium     Status: Abnormal   Collection Time: 04/26/17  6:09 AM  Result Value Ref Range   Magnesium 2.7 (H) 1.7 - 2.4 mg/dL   Comprehensive metabolic panel     Status: Abnormal   Collection Time: 04/26/17  6:09 AM  Result Value Ref Range   Sodium 139 135 - 145 mmol/L   Potassium 4.2 3.5 - 5.1 mmol/L   Chloride 101 101 - 111 mmol/L   CO2 24 22 - 32 mmol/L   Glucose, Bld 174 (H) 65 - 99 mg/dL   BUN 56 (H) 6 - 20 mg/dL   Creatinine, Ser 5.37 (H) 0.61 - 1.24 mg/dL   Calcium 7.8 (L) 8.9 - 10.3 mg/dL   Total Protein 7.6 6.5 - 8.1 g/dL   Albumin 2.2 (L) 3.5 - 5.0 g/dL   AST 295 (H) 15 - 41 U/L   ALT  288 (H) 17 - 63 U/L   Alkaline Phosphatase 85 38 - 126 U/L   Total Bilirubin 1.0 0.3 - 1.2 mg/dL   GFR calc non Af Amer 11 (L) >60 mL/min   GFR calc Af Amer 13 (L) >60 mL/min    Comment: (NOTE) The eGFR has been calculated using the CKD EPI equation. This calculation has not been validated in all clinical situations. eGFR's persistently <60 mL/min signify possible Chronic Kidney Disease.    Anion gap 14 5 - 15  CBC with Differential/Platelet     Status: Abnormal   Collection Time: 04/26/17  6:09 AM  Result Value Ref Range   WBC 22.9 (H) 4.0 - 10.5 K/uL   RBC 4.05 (L) 4.22 - 5.81 MIL/uL   Hemoglobin 11.4 (L) 13.0 - 17.0 g/dL   HCT 32.6 (L) 39.0 - 52.0 %   MCV 80.5 78.0 - 100.0 fL   MCH 28.1 26.0 - 34.0 pg   MCHC 35.0 30.0 - 36.0 g/dL   RDW 15.1 11.5 - 15.5 %   Platelets 195 150 - 400 K/uL   Neutrophils Relative % 92 %   Neutro Abs 21.1 (H) 1.7 - 7.7 K/uL   Lymphocytes Relative 5 %   Lymphs Abs 1.2 0.7 - 4.0 K/uL   Monocytes Relative 3 %   Monocytes Absolute 0.6 0.1 - 1.0 K/uL   Eosinophils Relative 0 %   Eosinophils Absolute 0.0 0.0 - 0.7 K/uL   Basophils Relative 0 %   Basophils Absolute 0.0 0.0 - 0.1 K/uL  Phosphorus     Status: Abnormal   Collection Time: 04/26/17  6:09 AM  Result Value Ref Range   Phosphorus 5.0 (H) 2.5 - 4.6 mg/dL  Glucose, capillary     Status: Abnormal   Collection Time: 04/26/17  7:51 AM  Result Value Ref Range   Glucose-Capillary 152 (H) 65 - 99 mg/dL    Recent  Results (from the past 240 hour(s))  MRSA PCR Screening     Status: None   Collection Time: 04/20/2017  3:32 PM  Result Value Ref Range Status   MRSA by PCR NEGATIVE NEGATIVE Final    Comment:        The GeneXpert MRSA Assay (FDA approved for NASAL specimens only), is one component of a comprehensive MRSA colonization surveillance program. It is not intended to diagnose MRSA infection nor to guide or monitor treatment for MRSA infections.   Culture, expectorated sputum-assessment     Status: None   Collection Time: 05/03/2017  4:40 PM  Result Value Ref Range Status   Specimen Description SPU  Final   Special Requests SPU  Final   Sputum evaluation THIS SPECIMEN IS ACCEPTABLE FOR SPUTUM CULTURE  Final   Report Status 04/23/2017 FINAL  Final  Culture, respiratory (NON-Expectorated)     Status: Abnormal   Collection Time: 04/11/2017  4:40 PM  Result Value Ref Range Status   Specimen Description SPU  Final   Special Requests SPU Reflexed from [id number]  Final   Gram Stain   Final    FEW WBC PRESENT, PREDOMINANTLY PMN RARE SQUAMOUS EPITHELIAL CELLS PRESENT MODERATE GRAM POSITIVE COCCI IN CHAINS MODERATE BUDDING YEAST SEEN FEW GRAM NEGATIVE RODS Performed at [facility], [place] [address], [place], [place] [address]    Culture MULTIPLE ORGANISMS PRESENT, NONE PREDOMINANT (A)  Final   Report Status 04/24/2017 FINAL  Final  Culture, blood (Routine X 2) w Reflex to ID Panel     Status:  Abnormal   Collection Time: 04/12/2017  6:58 PM  Result Value Ref Range Status   Specimen Description BLOOD RIGHT ARM  Final   Special Requests   Final    BOTTLES DRAWN AEROBIC AND ANAEROBIC Blood Culture adequate volume   Culture  Setup Time   Final    GRAM POSITIVE COCCI AEROBIC BOTTLE ONLY Gram Stain Report Called to,Read Back By and Verified With: JAMIE WHITE, RN @ [facility] @ 1600 ON 11.20.18 BY BOWMAN,L CRITICAL RESULT CALLED TO, READ BACK BY AND VERIFIED WITH: PHARMD V BRYK [id number] MLM    Culture  (A)  Final    STAPHYLOCOCCUS SPECIES (COAGULASE NEGATIVE) THE SIGNIFICANCE OF ISOLATING THIS ORGANISM FROM A SINGLE SET OF BLOOD CULTURES WHEN MULTIPLE SETS ARE DRAWN IS UNCERTAIN. PLEASE NOTIFY THE MICROBIOLOGY DEPARTMENT WITHIN ONE WEEK IF SPECIATION AND SENSITIVITIES ARE REQUIRED. Performed at [facility], [place] [address] [place], [place] [address]    Report Status 04/25/2017 FINAL  Final  Blood Culture ID Panel (Reflexed)     Status: Abnormal   Collection Time: 04/21/2017  6:58 PM  Result Value Ref Range Status   Enterococcus species NOT DETECTED NOT DETECTED Final   Listeria monocytogenes NOT DETECTED NOT DETECTED Final   Staphylococcus species DETECTED (A) NOT DETECTED Final    Comment: Methicillin (oxacillin) susceptible coagulase negative staphylococcus. Possible blood culture contaminant (unless isolated from more than one blood culture draw or clinical case suggests pathogenicity). No antibiotic treatment is indicated for blood  culture contaminants. CRITICAL RESULT CALLED TO, READ BACK BY AND VERIFIED WITH: PHARMD V BRYK [id number] MLM    Staphylococcus aureus NOT DETECTED NOT DETECTED Final   Methicillin resistance NOT DETECTED NOT DETECTED Final   Streptococcus species NOT DETECTED NOT DETECTED Final   Streptococcus agalactiae NOT DETECTED NOT DETECTED Final   Streptococcus pneumoniae NOT DETECTED NOT DETECTED Final   Streptococcus pyogenes NOT DETECTED NOT DETECTED Final   Acinetobacter baumannii NOT DETECTED NOT DETECTED Final   Enterobacteriaceae species NOT DETECTED NOT DETECTED Final   Enterobacter cloacae complex NOT DETECTED NOT DETECTED Final   Escherichia coli NOT DETECTED NOT DETECTED Final   Klebsiella oxytoca NOT DETECTED NOT DETECTED Final   Klebsiella pneumoniae NOT DETECTED NOT DETECTED Final   Proteus species NOT DETECTED NOT DETECTED Final   Serratia marcescens NOT DETECTED NOT DETECTED Final   Haemophilus influenzae NOT DETECTED NOT DETECTED Final    Neisseria meningitidis NOT DETECTED NOT DETECTED Final   Pseudomonas aeruginosa NOT DETECTED NOT DETECTED Final   Candida albicans NOT DETECTED NOT DETECTED Final   Candida glabrata NOT DETECTED NOT DETECTED Final   Candida krusei NOT DETECTED NOT DETECTED Final   Candida parapsilosis NOT DETECTED NOT DETECTED Final   Candida tropicalis NOT DETECTED NOT DETECTED Final    Comment: Performed at [facility], [address], [place], [place] [address]  Culture, blood (Routine X 2) w Reflex to ID Panel     Status: None (Preliminary result)   Collection Time: 04/09/2017  6:59 PM  Result Value Ref Range Status   Specimen Description BLOOD LEFT ARM  Final   Special Requests   Final    BOTTLES DRAWN AEROBIC AND ANAEROBIC Blood Culture adequate volume   Culture NO GROWTH 4 DAYS  Final   Report Status PENDING  Incomplete    Lipid Panel Recent Labs    04/23/17 1853  CHOL 125  TRIG 154*  HDL 36*  CHOLHDL 3.5  VLDL 31  LDLCALC  38    Studies/Results: Mr Brain Wo Contrast  Result Date: 04/24/2017 CLINICAL DATA:  Encephalopathy. EXAM: MRI HEAD WITHOUT CONTRAST TECHNIQUE: Multiplanar, multiecho pulse sequences of the brain and surrounding structures were obtained without intravenous contrast. COMPARISON:  CT head without contrast 04/23/2017. FINDINGS: Brain: Diffusion-weighted images is demonstrate extensive acute/subacute infarction. There are confluent cortical nonhemorrhagic infarcts involving the right temporal lobe and frontal operculum extending into the sylvian fissure and along the central sulcus. There is involvement of the medial frontal lobes, right greater than left. More scattered areas of cortical infarction are present in the left hemisphere. There is diffuse involvement with globus pallidus bilaterally with profound restricted diffusion. A focal area of restricted diffusion is present laterally in the left thalamus and more centrally in the right thalamus. A single punctate  focus of restricted diffusion is present in the anterior right pons. A remote lacunar infarct is present in the posteromedial right cerebellum measuring 14 mm. Other punctate foci are present in the cerebellum bilaterally. There is a focus in the right occipital lobe. These areas all demonstrate increased T2 signal compatible with a subacute time frame. There is some susceptibility in the high left frontal convexity which may represent some hemorrhage. Ventricles are of normal size. No significant extra-axial fluid collection is present. Internal auditory canals are within normal limits. Vascular: Flow is present in the major intracranial arteries. Skull and upper cervical spine: The skullbase is within normal limits. Patient is intubated. NG tube is in place. Sinuses/Orbits: Fluid levels are present in the nasopharynx. There fluid levels in the sphenoid sinuses and maxillary sinuses bilaterally. Bilateral mastoid effusions are present. Globes and orbits are within normal limits. IMPRESSION: 1. 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 Some of this reflects the sequela of a profound diffuse anoxic injury. The more peripheral scattered punctate cortical and cerebellar ischemia is atypical of hypoxia. A central embolic source such as carotid artery disease or cardiac thrombus were septal perforation may be related with diffuse embolic disease. 2. Confluent focus of restricted diffusion and signal abnormality within the anteromedial right temporal lobe. This may be secondary to seizure focus with source of seizure. 3. Minimal susceptibility over the high left frontal convexity may represent some petechial hemorrhage. 4. Extensive sinus disease related to intubation and NG tube. These results were called by telephone at the time of interpretation on 04/24/2017 at 3:21 pm to Dr. Titus Mould, who verbally acknowledged these results.  Electronically Signed   By: San Morelle M.D.   On: 04/24/2017 15:21   Dg Chest Port 1 View  Result Date: 04/26/2017 CLINICAL DATA:  46 y/o male who presented with status epilepticus in the setting of hyperosmolar hyperglycemic state, cardiac arrest and subsequent evidence on Brain MRI of anoxic injury. EXAM: PORTABLE CHEST 1 VIEW COMPARISON:  04/24/2017 and earlier. FINDINGS: Portable AP semi upright view at 0553 hours. Endotracheal tube tip now projects just above the clavicles. Stable right IJ tool lumen central line or a tool catheters superimposed on right PICC line. Enteric tube side hole at the level of the stomach. Lung volumes and mediastinal contours remain normal. Patchy and nodular basilar predominant bilateral pulmonary opacity persists. Upper lobe involvement is mildly regressed in the last 2 days. No pneumothorax, pleural effusion or consolidation identified. IMPRESSION: 1. Endotracheal tube tip now on just above the clavicles. Otherwise stable lines and tubes. 2. Mildly improved bilateral upper lung ventilation over the past 2 days with continued mid  and lower lung patchy and nodular opacity suspicious for bilateral respiratory infection. No pleural effusion is evident. Electronically Signed   By: Genevie Ann M.D.   On: 04/26/2017 07:19   Dg Chest Port 1 View  Result Date: 04/24/2017 CLINICAL DATA:  Central line placement. EXAM: PORTABLE CHEST 1 VIEW COMPARISON:  Radiograph of same day. FINDINGS: Endotracheal and nasogastric tubes are unchanged in position. Right-sided PICC line is unchanged in position. Interval placement of right internal jugular dialysis catheter with distal tip in expected position of SVC. Stable cardiomediastinal silhouette. Stable bilateral patchy airspace opacities are noted concerning for inflammation or edema. Bony thorax is unremarkable. IMPRESSION: Stable support apparatus. Interval placement of right internal jugular dialysis catheter with distal tip in  expected position of the SVC. Stable bilateral lung opacities are noted. Electronically Signed   By: Marijo Conception, M.D.   On: 04/24/2017 15:54    Medications:  Scheduled: . chlorhexidine gluconate (MEDLINE KIT)  15 mL Mouth Rinse BID  . Chlorhexidine Gluconate Cloth  6 each Topical Q0600  . feeding supplement (PRO-STAT SUGAR FREE 64)  30 mL Per Tube BID  . feeding supplement (VITAL HIGH PROTEIN)  1,000 mL Per Tube Q24H  . heparin subcutaneous  5,000 Units Subcutaneous Q12H  . insulin aspart  0-20 Units Subcutaneous Q4H  . insulin aspart  4 Units Subcutaneous Q4H  . insulin glargine  25 Units Subcutaneous Q24H  . labetalol  100 mg Per Tube BID  . mouth rinse  15 mL Mouth Rinse 10 times per day  . pantoprazole (PROTONIX) IV  40 mg Intravenous Q24H   Continuous: . ampicillin-sulbactam (UNASYN) IV    . dextrose    . levETIRAcetam Stopped (04/25/17 2126)  . dialysis replacement fluid (prismasate) 600 mL/hr at 04/26/17 0651  . dialysis replacement fluid (prismasate) 200 mL/hr at 04/25/17 1204  . dialysate (PRISMASATE) 1,500 mL/hr at 04/26/17 0748  . sodium chloride 999 mL/hr at 04/25/17 2230   GYI:RSWNIOEV, fentaNYL (SUBLIMAZE) injection, heparin, ipratropium-albuterol, labetalol, sodium chloride  Assessment:46 y.o.maleinitially presenting to OSH with status epilepticus in the setting of hyperosmolar hyperglycemic state  1.Most likely provoked seizures due to hyperglycemia, evolving to status epilepticus, which resolved with treatment.  2. Subsequent MRI brain revealsextensive areas of acute/subacute nonhemorrhagic infarct throughout the brain with involvement of the globus pallidus bilaterally. Some of this reflects the sequela of a profound diffuse anoxic injury. Also noted is a confluent focus of restricted diffusion and signal abnormality within the anteromedial right temporal lobe, which militates in favor of hypoxic injury versus or cytotoxic edema secondary to prolonged  seizure.The findings appear most consistent with brain damage secondary to prolonged seizure +/- anoxia.  3. EEG 11/20:Status epilepticus resolved. Very low voltage recording off sedation. Background frequencies are in the 3-4 Hz range and symmetrical. The patient has no response to stimulation and EEG shows no reactivity to stimulation. There are no epileptiform discharges or electrographic seizures.Thesevere generalized slowing of brain activity is concordant with the above etiologies listed in #2.   Recommendations: 1.Agree with Dr. Titus Mould that the patient's quality of life, if he survives the sequelae of diffuse cortical injury, is likely to be quite poor. Long term, he will most likely require full dependence on others for ADLs, including toileting and feeding.  2. Family is considering withdrawal of intensive care modalities.  3. Will sign off. Please call if there are additional questions.     LOS: 4 days   '@Electronically'$  signed: Dr. Kerney Elbe 04/26/2017  10:45  AM

## 2017-04-27 ENCOUNTER — Inpatient Hospital Stay (HOSPITAL_COMMUNITY): Payer: BLUE CROSS/BLUE SHIELD

## 2017-04-27 LAB — CBC WITH DIFFERENTIAL/PLATELET
BASOS PCT: 0 %
Basophils Absolute: 0 10*3/uL (ref 0.0–0.1)
EOS ABS: 0.1 10*3/uL (ref 0.0–0.7)
EOS PCT: 1 %
HCT: 27.7 % — ABNORMAL LOW (ref 39.0–52.0)
Hemoglobin: 9.6 g/dL — ABNORMAL LOW (ref 13.0–17.0)
Lymphocytes Relative: 7 %
Lymphs Abs: 1.1 10*3/uL (ref 0.7–4.0)
MCH: 28.1 pg (ref 26.0–34.0)
MCHC: 34.7 g/dL (ref 30.0–36.0)
MCV: 81 fL (ref 78.0–100.0)
MONO ABS: 1 10*3/uL (ref 0.1–1.0)
MONOS PCT: 6 %
NEUTROS PCT: 86 %
Neutro Abs: 14.2 10*3/uL — ABNORMAL HIGH (ref 1.7–7.7)
PLATELETS: 168 10*3/uL (ref 150–400)
RBC: 3.42 MIL/uL — ABNORMAL LOW (ref 4.22–5.81)
RDW: 15.1 % (ref 11.5–15.5)
WBC: 16.4 10*3/uL — ABNORMAL HIGH (ref 4.0–10.5)

## 2017-04-27 LAB — COMPREHENSIVE METABOLIC PANEL
ALK PHOS: 82 U/L (ref 38–126)
ALT: 176 U/L — AB (ref 17–63)
AST: 142 U/L — ABNORMAL HIGH (ref 15–41)
Albumin: 1.7 g/dL — ABNORMAL LOW (ref 3.5–5.0)
Anion gap: 8 (ref 5–15)
BUN: 49 mg/dL — AB (ref 6–20)
CALCIUM: 6.1 mg/dL — AB (ref 8.9–10.3)
CO2: 19 mmol/L — AB (ref 22–32)
CREATININE: 3.44 mg/dL — AB (ref 0.61–1.24)
Chloride: 112 mmol/L — ABNORMAL HIGH (ref 101–111)
GFR calc non Af Amer: 20 mL/min — ABNORMAL LOW (ref >60)
GFR, EST AFRICAN AMERICAN: 23 mL/min — AB (ref >60)
Glucose, Bld: 140 mg/dL — ABNORMAL HIGH (ref 65–99)
Potassium: 3.2 mmol/L — ABNORMAL LOW (ref 3.5–5.1)
SODIUM: 139 mmol/L (ref 135–145)
Total Bilirubin: 1 mg/dL (ref 0.3–1.2)
Total Protein: 5.7 g/dL — ABNORMAL LOW (ref 6.5–8.1)

## 2017-04-27 LAB — APTT: APTT: 27 s (ref 24–36)

## 2017-04-27 LAB — POCT ACTIVATED CLOTTING TIME
ACTIVATED CLOTTING TIME: 136 s
ACTIVATED CLOTTING TIME: 158 s
ACTIVATED CLOTTING TIME: 164 s
ACTIVATED CLOTTING TIME: 181 s
ACTIVATED CLOTTING TIME: 175 s
ACTIVATED CLOTTING TIME: 169 s
ACTIVATED CLOTTING TIME: 197 s
Activated Clotting Time: 169 seconds
Activated Clotting Time: 147 seconds
Activated Clotting Time: 169 seconds
Activated Clotting Time: 180 seconds
Activated Clotting Time: 147 seconds

## 2017-04-27 LAB — RENAL FUNCTION PANEL
ALBUMIN: 2.1 g/dL — AB (ref 3.5–5.0)
Anion gap: 12 (ref 5–15)
BUN: 67 mg/dL — AB (ref 6–20)
CO2: 22 mmol/L (ref 22–32)
CREATININE: 4.11 mg/dL — AB (ref 0.61–1.24)
Calcium: 8 mg/dL — ABNORMAL LOW (ref 8.9–10.3)
Chloride: 102 mmol/L (ref 101–111)
GFR calc Af Amer: 18 mL/min — ABNORMAL LOW (ref >60)
GFR, EST NON AFRICAN AMERICAN: 16 mL/min — AB (ref >60)
GLUCOSE: 178 mg/dL — AB (ref 65–99)
PHOSPHORUS: 6.1 mg/dL — AB (ref 2.5–4.6)
Potassium: 4.9 mmol/L (ref 3.5–5.1)
SODIUM: 136 mmol/L (ref 135–145)

## 2017-04-27 LAB — CULTURE, BLOOD (ROUTINE X 2)
Culture: NO GROWTH
SPECIAL REQUESTS: ADEQUATE

## 2017-04-27 LAB — C DIFFICILE QUICK SCREEN W PCR REFLEX
C DIFFICILE (CDIFF) INTERP: NOT DETECTED
C DIFFICLE (CDIFF) ANTIGEN: NEGATIVE
C Diff toxin: NEGATIVE

## 2017-04-27 LAB — GLUCOSE, CAPILLARY
GLUCOSE-CAPILLARY: 151 mg/dL — AB (ref 65–99)
GLUCOSE-CAPILLARY: 164 mg/dL — AB (ref 65–99)
GLUCOSE-CAPILLARY: 174 mg/dL — AB (ref 65–99)
Glucose-Capillary: 169 mg/dL — ABNORMAL HIGH (ref 65–99)
Glucose-Capillary: 187 mg/dL — ABNORMAL HIGH (ref 65–99)
Glucose-Capillary: 157 mg/dL — ABNORMAL HIGH (ref 65–99)

## 2017-04-27 LAB — PHOSPHORUS: Phosphorus: 3.9 mg/dL (ref 2.5–4.6)

## 2017-04-27 LAB — MAGNESIUM: Magnesium: 2.1 mg/dL (ref 1.7–2.4)

## 2017-04-27 MED ORDER — SODIUM CHLORIDE 0.9 % IJ SOLN
250.0000 [IU]/h | INTRAMUSCULAR | Status: DC
  Administered 2017-04-27: 250 [IU]/h via INTRAVENOUS_CENTRAL
  Administered 2017-04-27: 1200 [IU]/h via INTRAVENOUS_CENTRAL
  Administered 2017-04-28 (×2): 1900 [IU]/h via INTRAVENOUS_CENTRAL
  Administered 2017-04-28: 1750 [IU]/h via INTRAVENOUS_CENTRAL
  Administered 2017-04-28: 1900 [IU]/h via INTRAVENOUS_CENTRAL
  Filled 2017-04-27 (×5): qty 2

## 2017-04-27 MED ORDER — SODIUM CHLORIDE 0.9 % IV SOLN
1.0000 g | Freq: Once | INTRAVENOUS | Status: AC
  Administered 2017-04-27: 1 g via INTRAVENOUS
  Filled 2017-04-27: qty 10

## 2017-04-27 MED ORDER — HEPARIN SODIUM (PORCINE) 1000 UNIT/ML DIALYSIS
1000.0000 [IU] | INTRAMUSCULAR | Status: DC | PRN
  Filled 2017-04-27: qty 6

## 2017-04-27 MED ORDER — HEPARIN BOLUS VIA INFUSION (CRRT)
1000.0000 [IU] | INTRAVENOUS | Status: DC | PRN
  Administered 2017-04-27 (×3): 1000 [IU] via INTRAVENOUS_CENTRAL
  Filled 2017-04-27: qty 1000

## 2017-04-27 MED ORDER — ALTEPLASE 2 MG IJ SOLR
2.0000 mg | Freq: Once | INTRAMUSCULAR | Status: AC
  Administered 2017-04-27: 2 mg

## 2017-04-27 MED ORDER — POTASSIUM CHLORIDE 20 MEQ/15ML (10%) PO SOLN
40.0000 meq | Freq: Once | ORAL | Status: AC
  Administered 2017-04-27: 40 meq
  Filled 2017-04-27: qty 30

## 2017-04-27 NOTE — Progress Notes (Signed)
Notified Nephrology MD regarding issues with CRRT and HD cath.  Two filters and CRRT machine were changed and still continued to received Access Pressure Extremely Negative alarm.  Orders received to TPA ports.  TPA placed by IV team.  Instructed by IV team to wait for 2 hours and they will return to access ports.  Will continue to monitor patient.

## 2017-04-27 NOTE — Progress Notes (Signed)
[facility] Rounding Note  Subjective/Interval History:  Non responsive No sedation Neuro note reviewed - "poor prognosis for meaningful neurologicalrecovery, given minimal clinical improvement and the MRI findings".  CRRT (04/24/17) Heparin added d/t freq filter clotting (11/23) Curret Rx all 4K/300/200/1500 Effluent dose 22   Objective Vital signs in last 24 hours: Vitals:   04/27/17 0517 04/27/17 0525 04/27/17 0600 04/27/17 0700  BP: (!) 143/88  109/73 (!) 148/92  Pulse: (!) 110 (!) 109 87 (!) 108  Resp: (!) 26 (!) 25 (!) 24 (!) 24  Temp:      TempSrc:      SpO2: 96% (!) 81% 97% 96%  Weight:      Height:       Weight change: -4.1 kg (-0.6 oz)  Intake/Output Summary (Last 24 hours) at 04/27/2017 0806 Last data filed at 04/27/2017 0719 Gross per 24 hour  Intake 1970 ml  Output 4396 ml  Net -2426 ml   Physical Exam:  Blood pressure (!) 148/92, pulse (!) 108, temperature 99.3 F (37.4 C), temperature source Axillary, resp. rate (!) 24, height _0  (1.778 m), weight 112.5 kg (248 lb 0.3 oz), SpO2 96 %.  Gen: intubated, unresponsive CVP 9 Lines/tubes: ETT, foley, R PICC, NGT, R IJ trialysis catheter (11/21) Neck: Line in place Chest: Coarse BS, clear Heart: Regular. S4 S1 S2 Abdomen: soft, mild distension,  faint BS Ext: No LE edema Neuro: Unresponsive to painful stimuli (to me) Dialysis Access:  R IJ Trialysis (11/21)   Recent Labs  Lab 04/23/17 1523 04/23/17 2315 04/24/17 0432 04/25/17 0408 04/25/17 0840 04/25/17 1739 04/26/17 0609 04/26/17 1638 04/27/17 0311  NA 134* 139 140 141  --   --  139 137 139  K 5.4* 5.0 5.5* 5.1  --   --  4.2 4.3 3.2*  CL 107 110 111 108  --   --  101 102 112*  CO2 18* 20* 20* 21*  --   --  24 24 19*  GLUCOSE 697* 142* 142* 130*  --   --  174* 202* 140*  BUN 29* 39* 45* 57*  --   --  56* 52* 49*  CREATININE 5.30* 6.96* 7.71* 7.67*  --   --  5.37* 4.52* 3.44*  CALCIUM 6.1* 6.9* 7.1* 7.7*  --   --  7.8* 7.8* 6.1*  PHOS 4.5  --    --  6.6* 6.3* 5.3* 5.0* 3.6  3.6 3.9    Recent Labs  Lab 04/24/17 0432  04/26/17 0609 04/26/17 1638 04/27/17 0311  AST 580*  --  295*  --  142*  ALT 294*  --  288*  --  176*  ALKPHOS 67  --  85  --  82  BILITOT 1.0  --  1.0  --  1.0  PROT 5.9*  --  7.6  --  5.7*  ALBUMIN 2.2*   < > 2.2* 2.2* 1.7*   < > = values in this interval not displayed.    Recent Labs  Lab 04/11/2017 0908  04/24/17 0432 04/25/17 0408 04/26/17 0609 04/27/17 0311  WBC 18.9*   < > 17.1* 14.2* 22.9* 16.4*  NEUTROABS 13.9*  --   --   --  21.1* 14.2*  HGB 15.5   < > 11.8* 13.5 11.4* 9.6*  HCT 44.7   < > 34.7* 38.9* 32.6* 27.7*  MCV 80.7   < > 83.2 81.2 80.5 81.0  PLT 296   < > 192 171 195 168   < > =  values in this interval not displayed.    Recent Labs  Lab 04/27/2017 0911  TROPONINI <0.03    Recent Labs  Lab 04/26/17 1555 04/26/17 1938 04/26/17 2324 04/27/17 0429 04/27/17 0732  GLUCAP 181* 190* 169* 151* 164*   ABG    Component Value Date/Time   PHART 7.385 04/26/2017 0405   PCO2ART 38.0 04/26/2017 0405   PO2ART 68.1 (L) 04/26/2017 0405   HCO3 22.2 04/26/2017 0405   TCO2 22 04/25/2017 0853   ACIDBASEDEF 2.0 04/26/2017 0405   O2SAT 91.5 04/26/2017 0405   Studies/Results: Dg Chest Port 1 View  Result Date: 04/26/2017 CLINICAL DATA:  46 y/o male who presented with status epilepticus in the setting of hyperosmolar hyperglycemic state, cardiac arrest and subsequent evidence on Brain MRI of anoxic injury. EXAM: PORTABLE CHEST 1 VIEW COMPARISON:  04/24/2017 and earlier. FINDINGS: Portable AP semi upright view at 0553 hours. Endotracheal tube tip now projects just above the clavicles. Stable right IJ tool lumen central line or a tool catheters superimposed on right PICC line. Enteric tube side hole at the level of the stomach. Lung volumes and mediastinal contours remain normal. Patchy and nodular basilar predominant bilateral pulmonary opacity persists. Upper lobe involvement is mildly  regressed in the last 2 days. No pneumothorax, pleural effusion or consolidation identified. IMPRESSION: 1. Endotracheal tube tip now on just above the clavicles. Otherwise stable lines and tubes. 2. Mildly improved bilateral upper lung ventilation over the past 2 days with continued mid and lower lung patchy and nodular opacity suspicious for bilateral respiratory infection. No pleural effusion is evident. Electronically Signed   By: Genevie Ann M.D.   On: 04/26/2017 07:19   Medications: . ampicillin-sulbactam (UNASYN) IV Stopped (04/27/17 0350)  . dextrose    . heparin 10,000 units/ 20 mL infusion syringe 550 Units/hr (04/27/17 0709)  . levETIRAcetam Stopped (04/26/17 2218)  . dialysis replacement fluid (prismasate) 600 mL/hr at 04/27/17 0134  . dialysis replacement fluid (prismasate) 200 mL/hr at 04/27/17 0117  . dialysate (PRISMASATE) 1,500 mL/hr at 04/27/17 0726  . sodium chloride 999 mL/hr at 04/27/17 0517   . chlorhexidine gluconate (MEDLINE KIT)  15 mL Mouth Rinse BID  . Chlorhexidine Gluconate Cloth  6 each Topical Q0600  . feeding supplement (PRO-STAT SUGAR FREE 64)  60 mL Per Tube TID  . feeding supplement (VITAL HIGH PROTEIN)  1,000 mL Per Tube Q24H  . heparin subcutaneous  5,000 Units Subcutaneous Q12H  . insulin aspart  0-20 Units Subcutaneous Q4H  . insulin aspart  4 Units Subcutaneous Q4H  . insulin glargine  25 Units Subcutaneous Q24H  . labetalol  100 mg Per Tube BID  . mouth rinse  15 mL Mouth Rinse 10 times per day  . pantoprazole (PROTONIX) IV  40 mg Intravenous Q24H    Background: 46 y.o. year-old AAM PMH DM, HTN, medical noncompliance. Presented to [facility] following seizure at home, then in [facility] ED recurrent w/bradycarda into 30's requiring brief CPR, intubation for airway protection with possible aspiration and now ARDS picture. Marked hyperglycemia. Shock, progressive hypoxemia, fever and brief need for pressors. Shock liver. Transferred here to [facility] service for management.  Baseline creatinine most recently 1.4-1.5 PTA, progressive rise to 7.7 with oliguria. Asked to see for RRT  Assessment/Recommendations  1. AKI on CKD 3 - ischemic ATN (seizures, CPR, shock w/brief pressor requirement).  1. CRRT (300/200/1500/all 4K initiated 11/21 and to be continued with other support measures through Monday then consideration for de-escalation of care 2. K  replaced this AM via e-link 3. for 48 hours pending evolution/clarification of neurologic status 4. CVP goal achieved - keep volume even to neg 50 to maintain 2. Acute hypoxemic resp failure post sz/aspiration pneumonitis/bilat airspace opacities - per CCM. Vent support. Steroids. ATB's for PNA 3. Shock, s/p brief CPR. Off pressors, meds now for elev BP 4. Shock liver with transaminasitis improving 5. DM per CCM 6. Encephalopathy - assessing potential for neuro recovery from stroke vs anoxic brain injury. Severe and multifocal changes by MRI. Felt c/w anoxic brain injury with poor prognosis for meaningful recovery per CCM   Jamal Maes, MD [facility] [phone] pager 04/27/2017, 8:06 AM

## 2017-04-27 NOTE — Progress Notes (Signed)
PULMONARY / CRITICAL CARE MEDICINE   Name: Ricardo Rogers MRN: [id number] DOB: 01-26-1971    ADMISSION DATE:  04/27/2017 CONSULTATION DATE:  04/23/2017  REFERRING MD:  Forestine Na  CHIEF COMPLAINT:  Seizure  HISTORY OF PRESENT ILLNESS:  HPI obtained from medical chart review and from patient's mother at the bedside.     46 year old male with past medical history of seizures (not on AED), hypertension, diabetes, and childhood asthma who presented to [facility] on 11/19 by EMS with reported seizure like activity.  Mother witnessed seizure at home following fall from standing position in kitchen, unsure any head trauma.   Patient initially combative/ post-ictal in ER and then had another witnessed seizure followed by bradycardia into the 30's.  CPR briefly done, possibly 3 mins, and then intubated for airway protection.  During intubation, patient vomited large amounts of liquid like grape juice (patient drinks grape soda frequently at home) despite RSI with paralytics with probable aspiration.  Patient did require sedation afterwards for movements.  Initial glucose 498 up to 764 in ER.  Reportedly, last seizure was 1 year ago in the setting of hyperglycemia; no prior seizure history.  Patient is not compliant with any of his medications or diet.  Mother denies any recent illness or complaints from patient.  Patient admitted to ICU at [facility] in lack of ICU beds at [facility].  Placed continuous sedation, glucose stabilizer and neurology consulted.  Patient with hypotension requiring fluid boluses and placement of PICC line for poor venous access.   Patient loaded with keppra and seizures thought attributed to hyperglycemia.  CXR with progressive worsening of bilateral airspace disease after aspirating requiring increased PEEP and FiO2 on mechanical ventilation.  Started on empiric Unasyn.  Overnight, he developed fever, oliguira, and required vasopressor support.  Patient subsequently transferred to [facility]  today with available ICU bed, PCCM to accept and manage.   SUBJECTIVE:  Family meeting, continue support Re assess in 24-48 hours cdiff neg  VITAL SIGNS: BP (!) 148/92   Pulse (!) 108   Temp 99.3 F (37.4 C) (Axillary)   Resp (!) 24   Ht _0  (1.778 m)   Wt 112.5 kg (248 lb 0.3 oz)   SpO2 96%   BMI 35.59 kg/m   HEMODYNAMICS: CVP:  [3 mmHg-11 mmHg] 9 mmHg  VENTILATOR SETTINGS: Vent Mode: PRVC FiO2 (%):  [30 %-40 %] 30 % Set Rate:  [24 bmp] 24 bmp Vt Set:  [510 mL] 510 mL PEEP:  [5 cmH20] 5 cmH20 Pressure Support:  [10 cmH20] 10 cmH20 Plateau Pressure:  [16 cmH20-25 cmH20] 18 cmH20  INTAKE / OUTPUT: I/O last 3 completed shifts: In: 3090 [Other:705; NG/GT:1720; IV Piggyback:665] Out: 7250 [Urine:35; YQIHK:7425; Stool:130]  PHYSICAL EXAMINATION: General: vent calm Neuro: perrl, responds to pain maybe, possible posture, WD amrs HEENT: ett PULM: ronchi, coarse CV:  s1 s2 RRR GI: soft, BS wnl, no r, loose stool,s cdiff neg Extremities: no rash, nonpit edema     LABS:  BMET Recent Labs  Lab 04/26/17 0609 04/26/17 1638 04/27/17 0311  NA 139 137 139  K 4.2 4.3 3.2*  CL 101 102 112*  CO2 24 24 19*  BUN 56* 52* 49*  CREATININE 5.37* 4.52* 3.44*  GLUCOSE 174* 202* 140*    Electrolytes Recent Labs  Lab 04/26/17 0609 04/26/17 1638 04/27/17 0311  CALCIUM 7.8* 7.8* 6.1*  MG 2.7* 2.7* 2.1  PHOS 5.0* 3.6  3.6 3.9    CBC Recent Labs  Lab 04/25/17 0408 04/26/17 0609 04/27/17 0311  WBC 14.2* 22.9* 16.4*  HGB 13.5 11.4* 9.6*  HCT 38.9* 32.6* 27.7*  PLT 171 195 168    Coag's Recent Labs  Lab 04/24/17 1000  APTT 32  INR 1.39    Sepsis Markers Recent Labs  Lab 04/23/2017 1859 04/23/17 1523 04/23/17 2315 04/24/17 0432 04/25/17 0408  LATICACIDVEN 5.0* 1.7 1.6  --   --   PROCALCITON  --  >150.00  --  >150.00 132.07    ABG Recent Labs  Lab 04/24/17 0825 04/25/17 0853 04/26/17 0405  PHART 7.244* 7.438 7.385  PCO2ART 38.0 30.7* 38.0   PO2ART 136* 136.0* 68.1*    Liver Enzymes Recent Labs  Lab 04/24/17 0432  04/26/17 0609 04/26/17 1638 04/27/17 0311  AST 580*  --  295*  --  142*  ALT 294*  --  288*  --  176*  ALKPHOS 67  --  85  --  82  BILITOT 1.0  --  1.0  --  1.0  ALBUMIN 2.2*   < > 2.2* 2.2* 1.7*   < > = values in this interval not displayed.    Cardiac Enzymes Recent Labs  Lab 04/12/2017 0911  TROPONINI <0.03    Glucose Recent Labs  Lab 04/26/17 1123 04/26/17 1555 04/26/17 1938 04/26/17 2324 04/27/17 0429 04/27/17 0732  GLUCAP 171* 181* 190* 169* 151* 164*    Imaging No results found. STUDIES:  CT head 11/19 >> no acute intracranial abnormality; stable cerebral atrophy; acute pansinusitis  CT head 11/20 >> New hypodensity and loss of gray-white matter differentiation in the right insula and operculum, concerning for acute infarct.  Additional focal hypodensity within the left internal capsule is also suspicious for infarct. These could be related to hypoperfusion given the clinical history of cardiac arrest, however they are not the typical watershed infarct zones.  Unchanged acute pansinusitis. MRi 11/21>>>Extensive areas of acute/subacute nonhemorrhagic infarct throughout the brain with involvement of the globus pallidus bilaterally. Some of this reflects the sequela of a profound diffuse anoxic injury. The more peripheral scattered punctate cortical and cerebellar ischemia is atypical of hypoxia. A central embolic source such as carotid artery disease or cardiac thrombus were septal perforation may be related with diffuse embolic disease. 2. Confluent focus of restricted diffusion and signal abnormality within the anteromedial right temporal lobe. This may be secondary to seizure focus with source of seizure.  CULTURES: MRSA 11/19>> neg Sputum Cx 11/19 >>multiple  BC x 2 11/19 >>1/2 coag neg staph>>>  ANTIBIOTICS: 11/20 Unasyn >>11/20 11/20 Vanc>>11.22 11/20 Zosyn  >>  SIGNIFICANT EVENTS: 11/19  Admit to [facility] 11/20  Transfer to [facility]  LINES/TUBES: 11/19 ETT 11/19 L NGT 11/19 R PICC >>  DISCUSSION: 65 yoM w/PMH uncontrolled HTN and diabetes and hx of seizure (seizures associated w/ hyperglycemia) with witnessed seizures that became bradycardic in ER while postictal, requiring brief CPR, vomiting during intubation, with progressive renal failure, hypotension, worsening infiltrates.    ASSESSMENT / PLAN:  PULMONARY A: Acute hypoxic respiratory failure following seizure/ emesis Aspiration pneumonitis r/o pneumonia/ ARDS Hx Asthma - CXR 11/19 with development of bilateral opacities L > R P:   On baseline MV, ph wnl SBT cpap 5 ps5 tolerating, goal 2 hours Will need neuro to improve to extubate, unlikely Not a trach candidate per mom  CARDIOVASCULAR A:  Shock- presumed septic vs related to sedation Brief CPR- ?79mns following bradycardia/ seizure HTN controlled P:  Tele monitoring labetalol keep same dose Keep neg  cvvhd, did have -2.6 liters  RENAL A:   AKI on CKD (previous sCr 1.46 in 5/16)- oliguria w/ CVP of 16 Hyperkalemia- stable, no QRS changes hypokalemia P:   Cvvhd, tolerating neg 2 liters daily At 100-150 cvp goals met, may need to reduce rate off  GASTROINTESTINAL A:   NPO Transaminitis - possibly shock liver  Loose stools, cdiff neg P:   tolerating TF   HEMATOLOGIC A:   Leukocytosiscvvhd P:  Heparin through cvvhd, dc sub q SCDs  INFECTIOUS A:   Aspiration pneumonia/ pneumonitis  / MODS - PCT > 150 P:  Unasyn, remain Will establish stop date , likely 8 days in total  ENDOCRINE A:   Uncontrolled DM, ?possible HHNK P:   CBG q 4  lantus  NEUROLOGIC A:   Acute encephalopathy - possibly multifactorial- ddx anoxic injury vs toxic (not clearing previous sedation with AKI) vs CVA  - CT head 11/20 concerning for acute CVA vs hypoxic injury, pattern not typical for watershed infarct MRI brain - anoxia,  concern emboli - EEG 11/20 neg for epileptiform discharges; severe generalized slowing pattern Seizure- hx of, on no home meds P:   RASS goal:0 Progress poor, family updated Avoiding cont sedation Will re visit with family daily offerred comfort   FAMILY  - Updates: Mother and sister updated at bedside 11/20.  No family at bedside 11/21,  - Inter-disciplinary family meet or Palliative Care meeting due by:  Done by DF 11/21 and 11/23  Ccm time 30 min   Lavon Paganini. Titus Mould, MD, [place] Pgr: [place] [facility] 04/27/2017 7:43 AM

## 2017-04-27 NOTE — Progress Notes (Signed)
CRITICAL VALUE ALERT  Critical Value:  Ca++ 6.1   Date & Time Notied:  04/27/17 0550  Provider Notified: Dr. Darrick Pennaeterding Pola Corn(ELink)  Orders Received/Actions taken: MD aware - will enter new orders

## 2017-04-27 NOTE — Progress Notes (Signed)
eLink Physician-Brief Progress Note Patient Name: Ricardo HighlandJohn W Giovanelli DOB: May 24, 1971 MRN: [id number]   Date of Service  04/27/2017  HPI/Events of Note  Hypokalemia and hypocalcemia  eICU Interventions  Potassium and calcium replaced     Intervention Category Intermediate Interventions: Electrolyte abnormality - evaluation and management  DETERDING,ELIZABETH 04/27/2017, 5:57 AM

## 2017-04-28 ENCOUNTER — Inpatient Hospital Stay (HOSPITAL_COMMUNITY): Payer: BLUE CROSS/BLUE SHIELD

## 2017-04-28 LAB — CBC WITH DIFFERENTIAL/PLATELET
BASOS PCT: 0 %
Basophils Absolute: 0 10*3/uL (ref 0.0–0.1)
EOS ABS: 0.3 10*3/uL (ref 0.0–0.7)
Eosinophils Relative: 2 %
HEMATOCRIT: 31 % — AB (ref 39.0–52.0)
HEMOGLOBIN: 10.7 g/dL — AB (ref 13.0–17.0)
LYMPHS ABS: 1.6 10*3/uL (ref 0.7–4.0)
LYMPHS PCT: 10 %
MCH: 28.5 pg (ref 26.0–34.0)
MCHC: 34.5 g/dL (ref 30.0–36.0)
MCV: 82.4 fL (ref 78.0–100.0)
MONOS PCT: 8 %
Monocytes Absolute: 1.3 10*3/uL — ABNORMAL HIGH (ref 0.1–1.0)
NEUTROS ABS: 12.8 10*3/uL — AB (ref 1.7–7.7)
Neutrophils Relative %: 80 %
Platelets: 233 10*3/uL (ref 150–400)
RBC: 3.76 MIL/uL — ABNORMAL LOW (ref 4.22–5.81)
RDW: 15.3 % (ref 11.5–15.5)
WBC: 16 10*3/uL — ABNORMAL HIGH (ref 4.0–10.5)

## 2017-04-28 LAB — RENAL FUNCTION PANEL
Albumin: 2.1 g/dL — ABNORMAL LOW (ref 3.5–5.0)
Anion gap: 13 (ref 5–15)
BUN: 75 mg/dL — ABNORMAL HIGH (ref 6–20)
CO2: 22 mmol/L (ref 22–32)
Calcium: 8.1 mg/dL — ABNORMAL LOW (ref 8.9–10.3)
Chloride: 101 mmol/L (ref 101–111)
Creatinine, Ser: 4.03 mg/dL — ABNORMAL HIGH (ref 0.61–1.24)
GFR calc Af Amer: 19 mL/min — ABNORMAL LOW
GFR calc non Af Amer: 16 mL/min — ABNORMAL LOW
Glucose, Bld: 154 mg/dL — ABNORMAL HIGH (ref 65–99)
Phosphorus: 5.2 mg/dL — ABNORMAL HIGH (ref 2.5–4.6)
Potassium: 4.6 mmol/L (ref 3.5–5.1)
Sodium: 136 mmol/L (ref 135–145)

## 2017-04-28 LAB — POCT ACTIVATED CLOTTING TIME
ACTIVATED CLOTTING TIME: 180 s
ACTIVATED CLOTTING TIME: 180 s
ACTIVATED CLOTTING TIME: 191 s
ACTIVATED CLOTTING TIME: 191 s
ACTIVATED CLOTTING TIME: 197 s
ACTIVATED CLOTTING TIME: 191 s
ACTIVATED CLOTTING TIME: 202 s
ACTIVATED CLOTTING TIME: 202 s
ACTIVATED CLOTTING TIME: 191 s
Activated Clotting Time: 164 seconds
Activated Clotting Time: 175 seconds
Activated Clotting Time: 186 seconds
Activated Clotting Time: 191 seconds
Activated Clotting Time: 191 seconds
Activated Clotting Time: 197 seconds
Activated Clotting Time: 202 seconds
Activated Clotting Time: 208 seconds

## 2017-04-28 LAB — GLUCOSE, CAPILLARY
GLUCOSE-CAPILLARY: 112 mg/dL — AB (ref 65–99)
GLUCOSE-CAPILLARY: 118 mg/dL — AB (ref 65–99)
Glucose-Capillary: 134 mg/dL — ABNORMAL HIGH (ref 65–99)
Glucose-Capillary: 110 mg/dL — ABNORMAL HIGH (ref 65–99)
Glucose-Capillary: 153 mg/dL — ABNORMAL HIGH (ref 65–99)

## 2017-04-28 LAB — MAGNESIUM: MAGNESIUM: 2.8 mg/dL — AB (ref 1.7–2.4)

## 2017-04-28 LAB — APTT: APTT: 46 s — AB (ref 24–36)

## 2017-04-28 MED ORDER — SODIUM CHLORIDE 0.9 % IV SOLN
1.0000 mg/h | INTRAVENOUS | Status: DC
  Administered 2017-04-28: 2 mg/h via INTRAVENOUS
  Filled 2017-04-28: qty 10

## 2017-04-28 MED ORDER — GLYCOPYRROLATE 0.2 MG/ML IJ SOLN: 0.2000 mg | INTRAMUSCULAR | Status: DC | PRN

## 2017-04-28 MED ORDER — LORAZEPAM 1 MG PO TABS: 1.0000 mg | ORAL_TABLET | ORAL | Status: DC | PRN

## 2017-04-28 MED ORDER — LORAZEPAM 2 MG/ML PO CONC: 1.0000 mg | ORAL | Status: DC | PRN

## 2017-04-28 MED ORDER — LOPERAMIDE HCL 1 MG/5ML PO LIQD: 2.0000 mg | ORAL | Status: DC | PRN

## 2017-04-28 MED ORDER — INSULIN GLARGINE 100 UNIT/ML ~~LOC~~ SOLN
20.0000 [IU] | SUBCUTANEOUS | Status: DC
  Administered 2017-04-28: 20 [IU] via SUBCUTANEOUS
  Filled 2017-04-28: qty 0.2

## 2017-04-28 MED ORDER — GLYCOPYRROLATE 1 MG PO TABS
1.0000 mg | ORAL_TABLET | ORAL | Status: DC | PRN
  Filled 2017-04-28: qty 1

## 2017-04-28 MED ORDER — LORAZEPAM 2 MG/ML IJ SOLN
1.0000 mg | INTRAMUSCULAR | Status: DC | PRN
  Filled 2017-04-28: qty 1

## 2017-04-28 NOTE — Progress Notes (Signed)
Patient's mother spoke with Dr. Tyson AliasFeinstein.  Decision was made to make patient comfort care.  Orders received and have been initiated.  Will continue to monitor patient.

## 2017-04-28 NOTE — Progress Notes (Signed)
Patient was extubated per "withdrawal of life" protocol orders.  Patient tolerated well.

## 2017-04-28 NOTE — Progress Notes (Signed)
Extensive discussion with family mom. We discussed the poor prognosis and likely poor quality of life. Family has decided to offer full comfort care. They are aware that the patient may be transferred to palliative care floor for continued comfort care needs. They have been fully updated on the process and expectations.  Mcarthur Rossettianiel J. Tyson AliasFeinstein, MD, FACP Pgr: [phone] [place] [facility]

## 2017-04-28 NOTE — Progress Notes (Signed)
[facility] Rounding Note  Subjective/Interval History:  Non responsive No sedation Neuro note reviewed - "poor prognosis for meaningful neurologicalrecovery, given minimal clinical improvement and the MRI findings".  CRRT (04/24/17) Heparin added d/t freq filter clotting (11/23) and recurrent issues with filter yesterday Had IV team TPA catheter in case that issue Current Rx all 4K/600/200/1500 Effluent dose 22  No family at bedside  Objective Vital signs in last 24 hours: Vitals:   04/28/17 0700 04/28/17 0744 04/28/17 0800 04/28/17 0900  BP: 125/86 125/86 (!) 144/90 (!) 149/85  Pulse: (!) 104 (!) 114 (!) 109 (!) 111  Resp: 20 (!) 25 (!) 26 (!) 24  Temp:   98.2 F (36.8 C)   TempSrc:   Axillary   SpO2: 96%  98% 97%  Weight:      Height:       Weight change: -1.1 kg (-6.8 oz)  Intake/Output Summary (Last 24 hours) at 04/28/2017 0942 Last data filed at 04/28/2017 0900 Gross per 24 hour  Intake 2074 ml  Output 2467 ml  Net -393 ml   Physical Exam:  Blood pressure (!) 149/85, pulse (!) 111, temperature 98.2 F (36.8 C), temperature source Axillary, resp. rate (!) 24, height 5' 10"  (1.778 m), weight 111.4 kg (245 lb 9.5 oz), SpO2 97 %.  Gen: intubated, unresponsive CVP 9 Lines/tubes: ETT, foley, R PICC, NGT, R IJ trialysis catheter (11/21) Chest: Coarse BS, clear Heart: Regular. S4 S1 S2  Tachy 120 Abdomen: soft, mild distension,  faint BS Ext: No LE edema Neuro: Unresponsive to painful stimuli (to me) Dialysis Access:  R IJ Trialysis (11/21) CVP 5   Recent Labs  Lab 04/24/17 0432 04/25/17 0408 04/25/17 0840 04/25/17 1739 04/26/17 0609 04/26/17 1638 04/27/17 0311 04/27/17 1648 04/28/17 0457  NA 140 141  --   --  139 137 139 136 136  K 5.5* 5.1  --   --  4.2 4.3 3.2* 4.9 4.6  CL 111 108  --   --  101 102 112* 102 101  CO2 20* 21*  --   --  24 24 19* 22 22  GLUCOSE 142* 130*  --   --  174* 202* 140* 178* 154*  BUN 45* 57*  --   --  56* 52* 49* 67* 75*   CREATININE 7.71* 7.67*  --   --  5.37* 4.52* 3.44* 4.11* 4.03*  CALCIUM 7.1* 7.7*  --   --  7.8* 7.8* 6.1* 8.0* 8.1*  PHOS  --  6.6* 6.3* 5.3* 5.0* 3.6  3.6 3.9 6.1* 5.2*    Recent Labs  Lab 04/24/17 0432  04/26/17 0609  04/27/17 0311 04/27/17 1648 04/28/17 0457  AST 580*  --  295*  --  142*  --   --   ALT 294*  --  288*  --  176*  --   --   ALKPHOS 67  --  85  --  82  --   --   BILITOT 1.0  --  1.0  --  1.0  --   --   PROT 5.9*  --  7.6  --  5.7*  --   --   ALBUMIN 2.2*   < > 2.2*   < > 1.7* 2.1* 2.1*   < > = values in this interval not displayed.    Recent Labs  Lab 04/15/2017 0908  04/25/17 0408 04/26/17 0609 04/27/17 0311 04/28/17 0457  WBC 18.9*   < > 14.2* 22.9* 16.4* 16.0*  NEUTROABS 13.9*  --   --  21.1* 14.2* 12.8*  HGB 15.5   < > 13.5 11.4* 9.6* 10.7*  HCT 44.7   < > 38.9* 32.6* 27.7* 31.0*  MCV 80.7   < > 81.2 80.5 81.0 82.4  PLT 296   < > 171 195 168 233   < > = values in this interval not displayed.    Recent Labs  Lab 04/09/2017 0911  TROPONINI <0.03    Recent Labs  Lab 04/27/17 1932 04/27/17 2305 04/28/17 0351 04/28/17 0809 04/28/17 0811  GLUCAP 187* 157* 134* 112* 110*   ABG    Component Value Date/Time   PHART 7.385 04/26/2017 0405   PCO2ART 38.0 04/26/2017 0405   PO2ART 68.1 (L) 04/26/2017 0405   HCO3 22.2 04/26/2017 0405   TCO2 22 04/25/2017 0853   ACIDBASEDEF 2.0 04/26/2017 0405   O2SAT 91.5 04/26/2017 0405   Studies/Results: Dg Chest Port 1 View  Result Date: 04/28/2017 CLINICAL DATA:  Acute respiratory failure. EXAM: PORTABLE CHEST 1 VIEW COMPARISON:  04/27/2017 FINDINGS: Endotracheal tube terminates at the inferior margin of the clavicular heads, well above the carina. Enteric tube courses into the left upper abdomen with tip not imaged. Right jugular catheters terminate over the SVC. The cardiac silhouette is normal in size. Patchy bilateral airspace opacities have improved. No sizable pleural effusion or pneumothorax is  identified. IMPRESSION: Improving bilateral airspace disease. Electronically Signed   By: Logan Bores M.D.   On: 04/28/2017 07:35   Dg Chest Port 1 View  Result Date: 04/27/2017 CLINICAL DATA:  Pulmonary edema. EXAM: PORTABLE CHEST 1 VIEW COMPARISON:  04/26/2017 FINDINGS: Endotracheal tube terminates at the clavicular heads, 5.5 cm above the carina. Right jugular catheters terminate over the SVC. Enteric tube courses into the left upper abdomen with tip not imaged. The cardiomediastinal silhouette is unchanged. Airspace opacities, predominantly in the perihilar and basilar regions bilaterally, are stable to slightly improved. No sizable pleural effusion or pneumothorax is identified. IMPRESSION: Stable to slight improvement of bilateral airspace opacities. Electronically Signed   By: Logan Bores M.D.   On: 04/27/2017 08:27   Medications: . ampicillin-sulbactam (UNASYN) IV Stopped (04/28/17 0330)  . dextrose    . heparin 10,000 units/ 20 mL infusion syringe 1,900 Units/hr (04/28/17 0909)  . levETIRAcetam Stopped (04/27/17 2216)  . dialysis replacement fluid (prismasate) 600 mL/hr at 04/28/17 0747  . dialysis replacement fluid (prismasate) 200 mL/hr at 04/27/17 2241  . dialysate (PRISMASATE) 1,500 mL/hr at 04/28/17 0932  . sodium chloride 999 mL/hr at 04/27/17 2159   . chlorhexidine gluconate (MEDLINE KIT)  15 mL Mouth Rinse BID  . Chlorhexidine Gluconate Cloth  6 each Topical Q0600  . feeding supplement (PRO-STAT SUGAR FREE 64)  60 mL Per Tube TID  . feeding supplement (VITAL HIGH PROTEIN)  1,000 mL Per Tube Q24H  . insulin aspart  0-20 Units Subcutaneous Q4H  . insulin aspart  4 Units Subcutaneous Q4H  . insulin glargine  20 Units Subcutaneous Q24H  . labetalol  100 mg Per Tube BID  . mouth rinse  15 mL Mouth Rinse 10 times per day  . pantoprazole (PROTONIX) IV  40 mg Intravenous Q24H    Background: 46 y.o. year-old AAM PMH DM, HTN, medical noncompliance. Presented to [facility] following  seizure at home, then in [facility] ED recurrent w/bradycarda into 30's requiring brief CPR, intubation for airway protection with possible aspiration and now ARDS picture. Marked hyperglycemia. Shock, progressive hypoxemia, fever and brief need for pressors. Shock liver. Transferred here to [facility] service for  management. Baseline creatinine most recently 1.4-1.5 PTA, progressive rise to 7.7 with oliguria. Asked to see for RRT  Assessment/Recommendations  1. AKI on CKD 3 - ischemic ATN (seizures, CPR, shock w/brief pressor requirement).  1. CRRT (300/200/1500/all 4K initiated 11/21 and to be continued with other support measures through Monday then consideration for de-escalation of care 2. Heparin added d/t filter clotting 3. K and phos OK today 4. CVP goal achieved - actually low side at 5 - keep even 2. Acute hypoxemic resp failure post sz/aspiration pneumonitis/bilat airspace opacities - per CCM. Vent support. Steroids. ATB's for PNA 3. Shock, s/p brief CPR. Off pressors, meds now for elev BP 4. Shock liver with transaminasitis improving 5. DM per CCM 6. Encephalopathy - assessing potential for neuro recovery from stroke vs anoxic brain injury. Severe and multifocal changes by MRI. Felt c/w anoxic brain injury with poor prognosis for meaningful recovery per CCM. For family meeting tomorrow.  Jamal Maes, MD [facility] [phone] pager 04/28/2017, 9:42 AM

## 2017-04-28 NOTE — Progress Notes (Signed)
PULMONARY / CRITICAL CARE MEDICINE   Name: Ricardo Rogers MRN: [id number] DOB: 1971/02/26    ADMISSION DATE:  04/09/2017 CONSULTATION DATE:  04/23/2017  REFERRING MD:  Forestine Na  CHIEF COMPLAINT:  Seizure  HISTORY OF PRESENT ILLNESS:  HPI obtained from medical chart review and from patient's mother at the bedside.     46 year old male with past medical history of seizures (not on AED), hypertension, diabetes, and childhood asthma who presented to [facility] on 11/19 by EMS with reported seizure like activity.  Mother witnessed seizure at home following fall from standing position in kitchen, unsure any head trauma.   Patient initially combative/ post-ictal in ER and then had another witnessed seizure followed by bradycardia into the 30's.  CPR briefly done, possibly 3 mins, and then intubated for airway protection.  During intubation, patient vomited large amounts of liquid like grape juice (patient drinks grape soda frequently at home) despite RSI with paralytics with probable aspiration.  Patient did require sedation afterwards for movements.  Initial glucose 498 up to 764 in ER.  Reportedly, last seizure was 1 year ago in the setting of hyperglycemia; no prior seizure history.  Patient is not compliant with any of his medications or diet.  Mother denies any recent illness or complaints from patient.  Patient admitted to ICU at [facility] in lack of ICU beds at [facility].  Placed continuous sedation, glucose stabilizer and neurology consulted.  Patient with hypotension requiring fluid boluses and placement of PICC line for poor venous access.   Patient loaded with keppra and seizures thought attributed to hyperglycemia.  CXR with progressive worsening of bilateral airspace disease after aspirating requiring increased PEEP and FiO2 on mechanical ventilation.  Started on empiric Unasyn.  Overnight, he developed fever, oliguira, and required vasopressor support.  Patient subsequently transferred to [facility]  today with available ICU bed, PCCM to accept and manage.   SUBJECTIVE:  cvvhd held from technical standpoint No neuro changes  VITAL SIGNS: BP (!) 148/80   Pulse (!) 110   Temp 98.7 F (37.1 C) (Axillary)   Resp (!) 23   Ht _0  (1.778 m)   Wt 111.4 kg (245 lb 9.5 oz)   SpO2 100%   BMI 35.24 kg/m   HEMODYNAMICS: CVP:  [0 mmHg-11 mmHg] 11 mmHg  VENTILATOR SETTINGS: Vent Mode: PRVC FiO2 (%):  [30 %-60 %] 40 % Set Rate:  [24 bmp] 24 bmp Vt Set:  [510 mL] 510 mL PEEP:  [5 cmH20] 5 cmH20 Plateau Pressure:  [11 cmH20-18 cmH20] 18 cmH20  INTAKE / OUTPUT: I/O last 3 completed shifts: In: 3139 [Other:544; NG/GT:1880; IV [id number]:643] Out: 3295 [Urine:15; JOACZ:6606; Stool:505]  PHYSICAL EXAMINATION: General: no change neuro Neuro: not fc, perrl, strong cough, no clear posturing HEENT: ett PULM: ronchi bilateral moderate CV: s1 s2 rr tachy GI: soft, Bs wnl, no r Extremities: edema mild      LABS:  BMET Recent Labs  Lab 04/27/17 0311 04/27/17 1648 04/28/17 0457  NA 139 136 136  K 3.2* 4.9 4.6  CL 112* 102 101  CO2 19* 22 22  BUN 49* 67* 75*  CREATININE 3.44* 4.11* 4.03*  GLUCOSE 140* 178* 154*    Electrolytes Recent Labs  Lab 04/26/17 1638 04/27/17 0311 04/27/17 1648 04/28/17 0457  CALCIUM 7.8* 6.1* 8.0* 8.1*  MG 2.7* 2.1  --  2.8*  PHOS 3.6  3.6 3.9 6.1* 5.2*    CBC Recent Labs  Lab 04/26/17 0609 04/27/17 0311 04/28/17  0457  WBC 22.9* 16.4* 16.0*  HGB 11.4* 9.6* 10.7*  HCT 32.6* 27.7* 31.0*  PLT 195 168 233    Coag's Recent Labs  Lab 04/24/17 1000 04/27/17 2025 04/28/17 0457  APTT 32 27 46*  INR 1.39  --   --     Sepsis Markers Recent Labs  Lab 04/05/2017 1859 04/23/17 1523 04/23/17 2315 04/24/17 0432 04/25/17 0408  LATICACIDVEN 5.0* 1.7 1.6  --   --   PROCALCITON  --  >150.00  --  >150.00 132.07    ABG Recent Labs  Lab 04/24/17 0825 04/25/17 0853 04/26/17 0405  PHART 7.244* 7.438 7.385  PCO2ART 38.0 30.7*  38.0  PO2ART 136* 136.0* 68.1*    Liver Enzymes Recent Labs  Lab 04/24/17 0432  04/26/17 0609  04/27/17 0311 04/27/17 1648 04/28/17 0457  AST 580*  --  295*  --  142*  --   --   ALT 294*  --  288*  --  176*  --   --   ALKPHOS 67  --  85  --  82  --   --   BILITOT 1.0  --  1.0  --  1.0  --   --   ALBUMIN 2.2*   < > 2.2*   < > 1.7* 2.1* 2.1*   < > = values in this interval not displayed.    Cardiac Enzymes Recent Labs  Lab 04/10/2017 0911  TROPONINI <0.03    Glucose Recent Labs  Lab 04/27/17 0732 04/27/17 1128 04/27/17 1643 04/27/17 1932 04/27/17 2305 04/28/17 0351  GLUCAP 164* 174* 169* 187* 157* 134*    Imaging Dg Chest Port 1 View  Result Date: 04/28/2017 CLINICAL DATA:  Acute respiratory failure. EXAM: PORTABLE CHEST 1 VIEW COMPARISON:  04/27/2017 FINDINGS: Endotracheal tube terminates at the inferior margin of the clavicular heads, well above the carina. Enteric tube courses into the left upper abdomen with tip not imaged. Right jugular catheters terminate over the SVC. The cardiac silhouette is normal in size. Patchy bilateral airspace opacities have improved. No sizable pleural effusion or pneumothorax is identified. IMPRESSION: Improving bilateral airspace disease. Electronically Signed   By: Logan Bores M.D.   On: 04/28/2017 07:35   STUDIES:  CT head 11/19 >> no acute intracranial abnormality; stable cerebral atrophy; acute pansinusitis  CT head 11/20 >> New hypodensity and loss of gray-white matter differentiation in the right insula and operculum, concerning for acute infarct.  Additional focal hypodensity within the left internal capsule is also suspicious for infarct. These could be related to hypoperfusion given the clinical history of cardiac arrest, however they are not the typical watershed infarct zones.  Unchanged acute pansinusitis. MRi 11/21>>>Extensive areas of acute/subacute nonhemorrhagic infarct throughout the brain with involvement of the  globus pallidus bilaterally. Some of this reflects the sequela of a profound diffuse anoxic injury. The more peripheral scattered punctate cortical and cerebellar ischemia is atypical of hypoxia. A central embolic source such as carotid artery disease or cardiac thrombus were septal perforation may be related with diffuse embolic disease. 2. Confluent focus of restricted diffusion and signal abnormality within the anteromedial right temporal lobe. This may be secondary to seizure focus with source of seizure.  CULTURES: MRSA 11/19>> neg Sputum Cx 11/19 >>multiple  BC x 2 11/19 >>1/2 coag neg staph>>>  ANTIBIOTICS: 11/20 Unasyn >>11/20 11/20 Vanc>>11.22 11/20 Zosyn >>  SIGNIFICANT EVENTS: 11/19  Admit to [facility] 11/20  Transfer to [facility]  LINES/TUBES: 11/19 ETT 11/19 L NGT 11/19 R  PICC >>  DISCUSSION: 76 yoM w/PMH uncontrolled HTN and diabetes and hx of seizure (seizures associated w/ hyperglycemia) with witnessed seizures that became bradycardic in ER while postictal, requiring brief CPR, vomiting during intubation, with progressive renal failure, hypotension, worsening infiltrates.    ASSESSMENT / PLAN:  PULMONARY A: Acute hypoxic respiratory failure following seizure/ emesis Aspiration pneumonitis r/o pneumonia/ ARDS Hx Asthma - CXR 11/19 with development of bilateral opacities L > R P:   Resolving edema and ali on pcxr PS 8 cpap 5, yesterday, repeat today Plan family meeting in am  Some positional desat , may bet atx remaining Neg baalnce goals  CARDIOVASCULAR A:  Shock- presumed septic vs related to sedation Brief CPR- ?49mns following bradycardia/ seizure HTN controlled P:  Tele monitoring Labetalol, sys goal 125 met Keep neg cvvhd, was neg 750, has more to give but less on pcxr today  RENAL A:   AKI on CKD (previous sCr 1.46 in 5/16)- oliguria w/ CVP of 16 Hyperkalemia- stable, no QRS changes hypokalemia P:   Cvvhd restarted Neg goals remain 100  hr  GASTROINTESTINAL A:   NPO Transaminitis - possibly shock liver  Loose stools, cdiff neg P:   tolerating TF  Has bM immodium add  HEMATOLOGIC A:   Leukocytosis, hemoconcetration for sure P:  Heparin through cvvhd SCDs  INFECTIOUS A:   Aspiration pneumonia/ pneumonitis  / MODS - PCT > 150 P:  Unasyn to 11/27  ENDOCRINE A:   Uncontrolled DM, ?possible HHNK P:   CBG q 4  lantus slight reduction  NEUROLOGIC A:   Acute encephalopathy - possibly multifactorial- ddx anoxic injury vs toxic (not clearing previous sedation with AKI) vs CVA  - CT head 11/20 concerning for acute CVA vs hypoxic injury, pattern not typical for watershed infarct MRI brain - anoxia, concern emboli - EEG 11/20 neg for epileptiform discharges; severe generalized slowing pattern Seizure- hx of, on no home meds P:   RASS goal:0 Int fent offerred comfort, will re visit family meeting monday  FAMILY  - Updates: Mother and sister updated at bedside 11/20.  No family at bedside 11/21,  - Inter-disciplinary family meet or Palliative Care meeting due by:  Done by DF 11/21 and 11/23  Ccm time 30 min   DLavon Paganini FTitus Mould MD, FSan JuanPgr: 3Crooked Lake ParkPulmonary & Critical Care 04/28/2017 7:47 AM

## 2017-04-30 LAB — PATHOLOGIST SMEAR REVIEW

## 2017-05-04 NOTE — Progress Notes (Signed)
Pt stopped breathing and asystolic at 0017. Lung and heart sounds auscultated for 2 minutes by Antonieta IbaSavannah Jenkins, RN and Bartholomew CrewsAmanda Daundre Biel, RN. Pt time of death [id number]. All equipment removed from pt. Family at bedside, emotional support given. Will continue to support family as needed.  200 mL Morphine wasted in sink with Antonieta IbaSavannah Jenkins, RN

## 2017-05-04 DEATH — deceased

## 2017-05-15 ENCOUNTER — Telehealth: Payer: Self-pay

## 2017-05-15 NOTE — Telephone Encounter (Signed)
On 05/15/17 I [facility] a [facility] (original[facility] The d/c is for burial. The patient is a patient of Doctor Tyson AliasFeinstein. [facility].  On 05/20/17 I received the d./c back from Doctor Craige CottaSood who signed the d/c for Doctor Tyson AliasFeinstein. I got the d/c ready and called the funeral home to let them know the d/c was mailed to [facility] [employer] per the funeral home request.

## 2017-05-15 NOTE — Telephone Encounter (Signed)
On 05/15/2017 I received a [facility]/c from [facility] (original). The d/c is for burial. The patient is a patient of Doctor Tyson AliasFeinstein. [facility] ([address]) this pm for signature.

## 2017-06-04 NOTE — Discharge Summary (Signed)
Death Summary  47 year old male with past medical history of seizures (not on AED), hypertension, diabetes, and childhood asthma who presented to [facility] on 11/19 by EMS with reported seizure like activity.  Mother witnessed seizure at home following fall from standing position in kitchen, unsure any head trauma.   Patient initially combative/ post-ictal in ER and then had another witnessed seizure followed by bradycardia into the 30's.  CPR briefly done, possibly 3 mins, and then intubated for airway protection.  During intubation, patient vomited large amounts of liquid like grape juice (patient drinks grape soda frequently at home) despite RSI with paralytics with probable aspiration.  Patient did require sedation afterwards for movements.  Initial glucose 498 up to 764 in ER.  Reportedly, last seizure was 1 year ago in the setting of hyperglycemia; no prior seizure history.  Patient is not compliant with any of his medications or diet.  Mother denies any recent illness or complaints from patient.  [facility] at [facility].  Placed continuous sedation, glucose stabilizer and neurology consulted.  Patient with hypotension requiring fluid boluses and placement of PICC line for poor venous access.   Patient loaded with keppra and seizures thought attributed to hyperglycemia.  CXR with progressive worsening of bilateral airspace disease after aspirating requiring increased PEEP and FiO2 on mechanical ventilation.  Started on empiric Unasyn.  Overnight, he developed fever, oliguira, and required vasopressor support.  [facility], PCCM to accept and manage  Hospital course ARDS, asp PNA, poor neuro status MRI with anoxia CVVHd for renal failure Neuro poor prognosis  Comfort care provided  Final diagnsis upon death  1. Cardiac arrest 2. Massive Aspiration PNA 3. Acute renal failure 4. ARDS 5.  Acute resp failure 6. Comfort care 7. Severe anoxic brain injury  Mcarthur Rossettianiel J. Tyson AliasFeinstein, MD, FACP Pgr: [phone] [place] Pulmonary [facility] Critical Care

## 2019-03-08 IMAGING — MR MR HEAD W/O CM
9 of 11 series · 32 of 48 positions shown · non-contrast
Comparison: CT head without contrast 04/23/2017.

CLINICAL DATA: Encephalopathy.

EXAM:
MRI HEAD WITHOUT CONTRAST
TECHNIQUE: Multiplanar, multiecho pulse sequences of the brain and surrounding
structures were obtained without intravenous contrast.

[Series 3: DWI · axial · 3.0mm · 0.94mm/px · z∈[-65,+82]mm · 8 of 100 slices shown (1 of 2)]
[im 1/100]
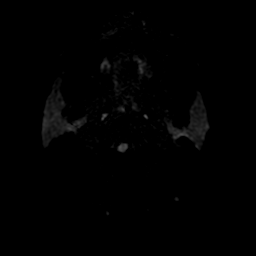
[im 15/100]
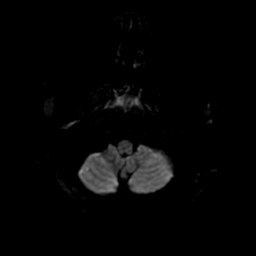
[im 29/100]
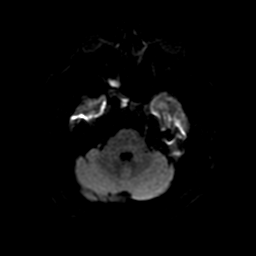
[im 43/100]
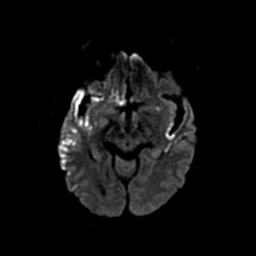
[im 57/100]
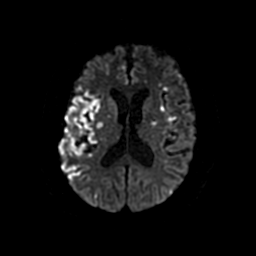
[im 71/100]
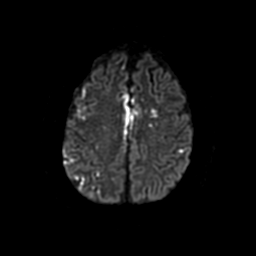
[im 85/100]
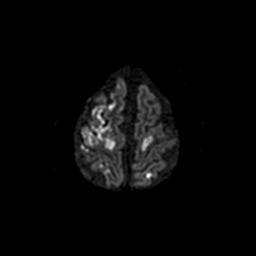
[im 100/100]
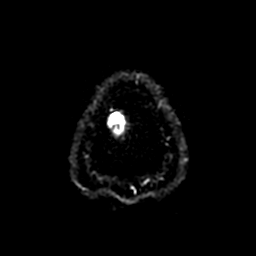

[Series 4: DWI · coronal · 4.0mm · 0.94mm/px · 5 of 68 slices shown (2 of 2)]
[im 1/68]
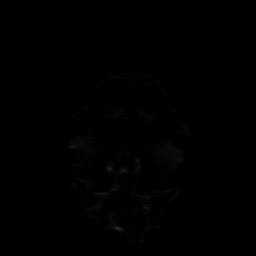
[im 17/68]
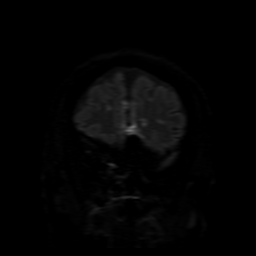
[im 34/68]
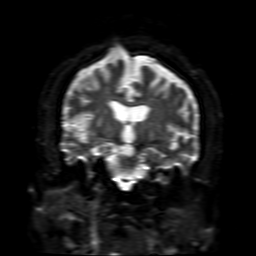
[im 51/68]
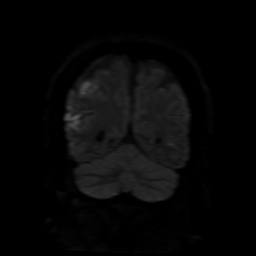
[im 68/68]
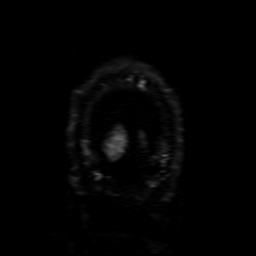

[Series 5: FLAIR · sagittal · 5.0mm · 0.47mm/px · 2 of 26 slices shown (1 of 2)]
[im 1/26]
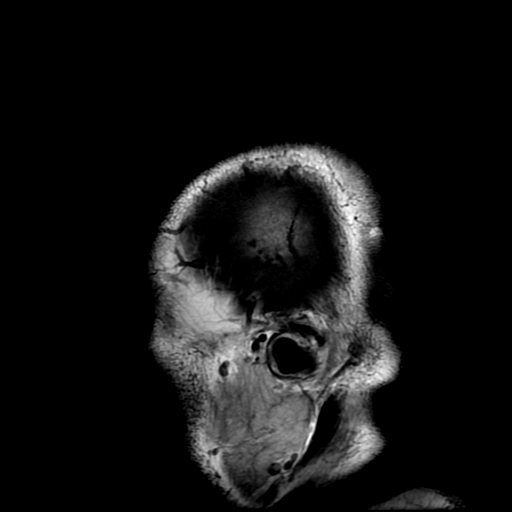
[im 26/26]
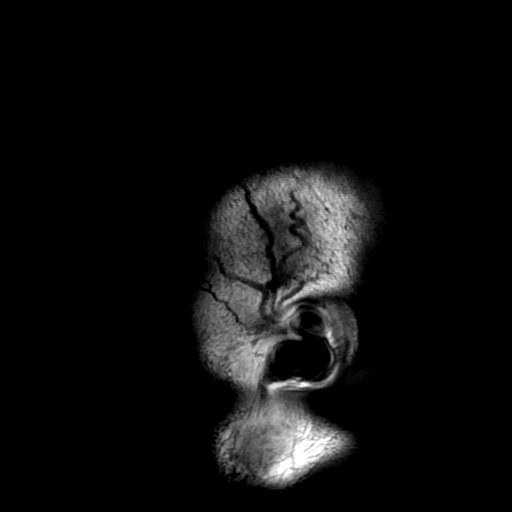

[Series 6: T2 · axial · 5.0mm · 0.47mm/px · z∈[-61,+88]mm · 2 of 26 slices shown (1 of 2)]
[im 1/26]
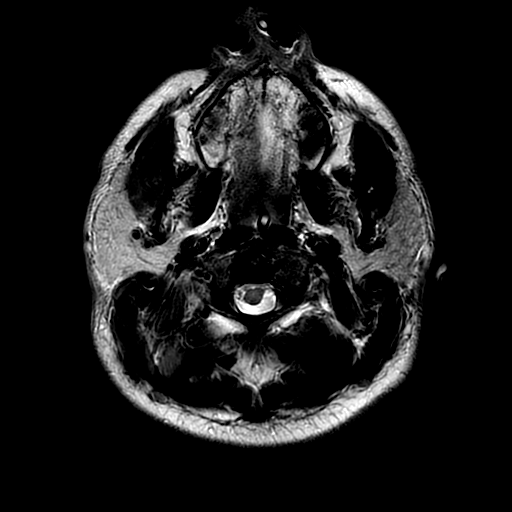
[im 26/26]
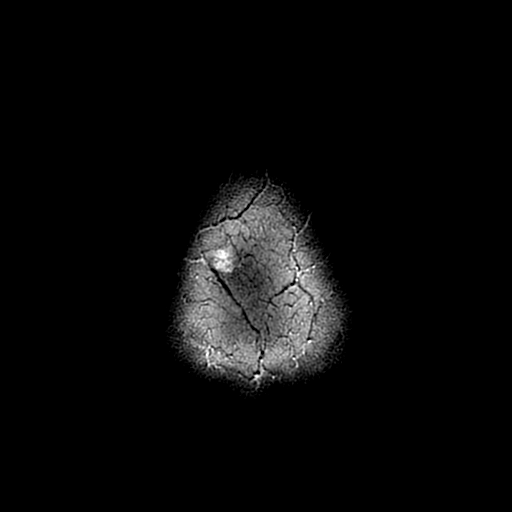

[Series 7: FLAIR · axial · 5.0mm · 0.47mm/px · z∈[-61,+88]mm · 2 of 26 slices shown (2 of 2)]
[im 1/26]
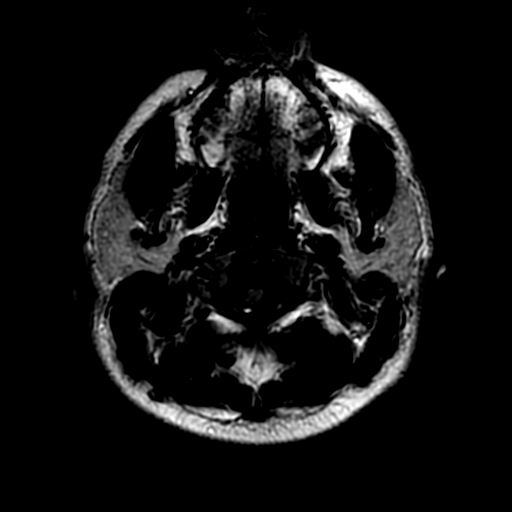
[im 26/26]
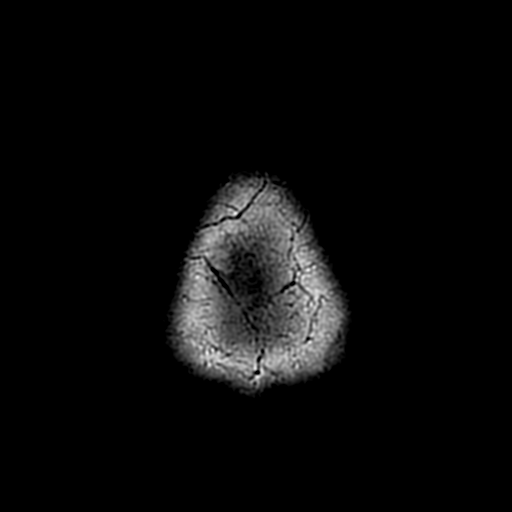

[Series 8: (person_name) · axial · 3.0mm · 0.47mm/px · z∈[-65,-2]mm · 4 of 100 slices shown]
[im 1/100]
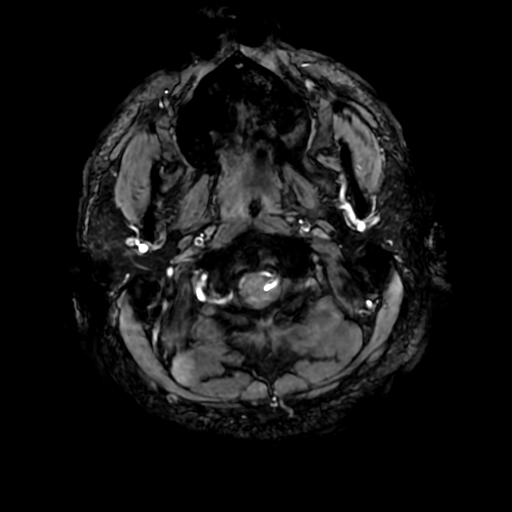
[im 15/100]
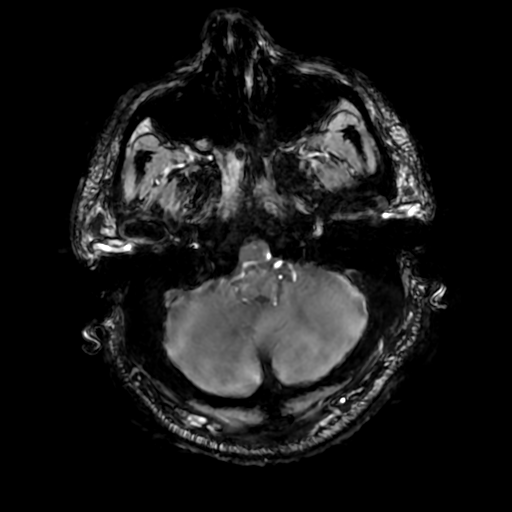
[im 29/100]
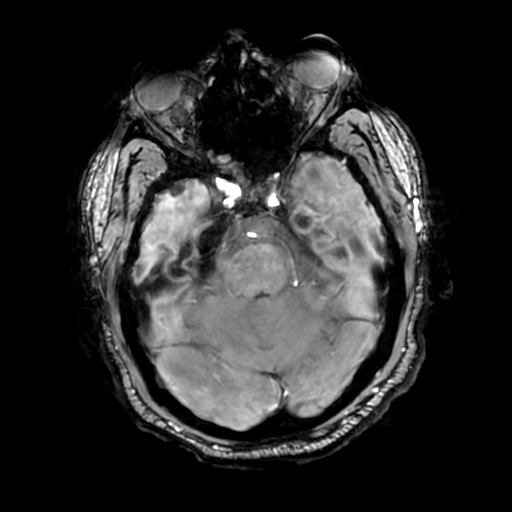
[im 43/100]
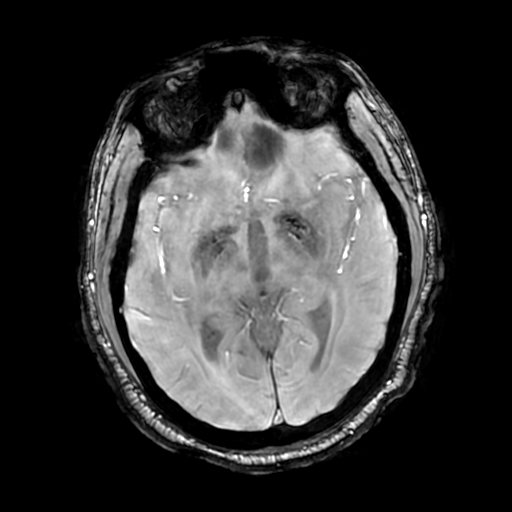

[Series 10: T2 · coronal · 5.0mm · 0.47mm/px · 2 of 28 slices shown (2 of 2)]
[im 1/28]
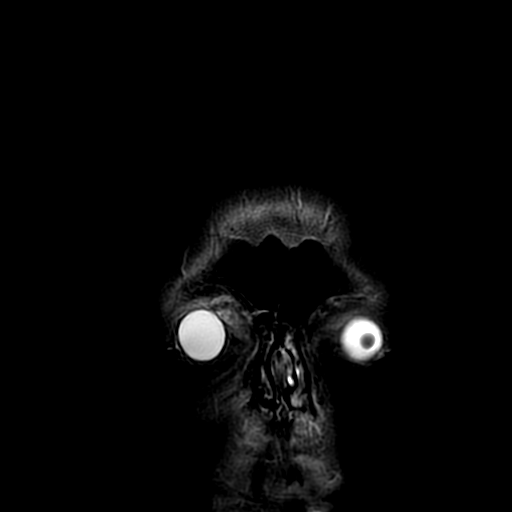
[im 28/28]
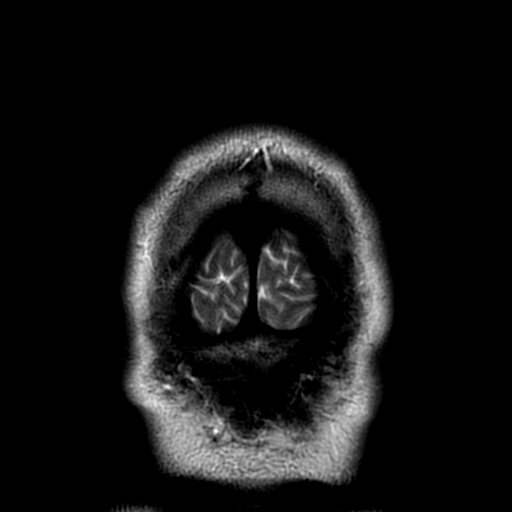

[Series 350: ADC · axial · 3.0mm · 0.94mm/px · z∈[-65,+82]mm · 4 of 50 slices shown (1 of 2)]
[im 1/50]
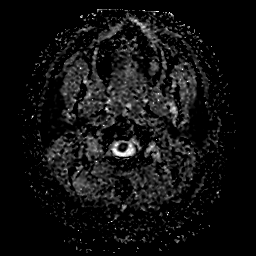
[im 17/50]
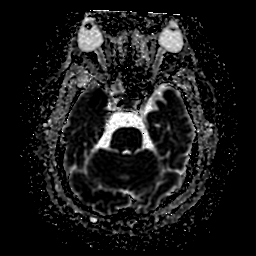
[im 33/50]
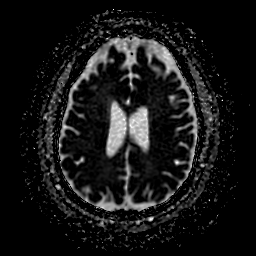
[im 50/50]
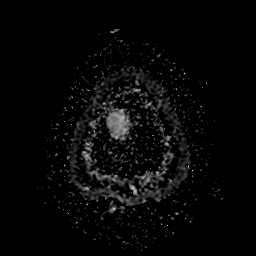

[Series 450: ADC · coronal · 4.0mm · 0.94mm/px · 3 of 34 slices shown (2 of 2)]
[im 1/34]
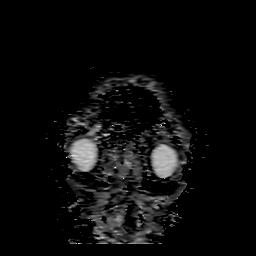
[im 17/34]
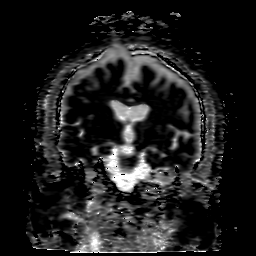
[im 34/34]
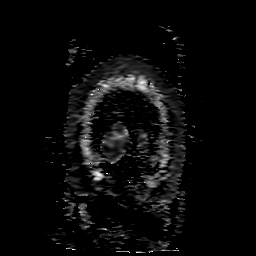

[32 of 48 positions shown; findings below may reference images not displayed]

FINDINGS: Brain: Diffusion-weighted images is demonstrate extensive
acute/subacute infarction. There are confluent cortical
nonhemorrhagic infarcts involving the right temporal lobe and
frontal operculum extending into the sylvian fissure and along the
central sulcus. There is involvement of the medial frontal lobes,
right greater than left. More scattered areas of cortical infarction
are present in the left hemisphere. There is diffuse involvement
with globus pallidus bilaterally with profound restricted diffusion.
A focal area of restricted diffusion is present laterally in the
left thalamus and more centrally in the right thalamus. A single
punctate focus of restricted diffusion is present in the anterior
right pons. A remote lacunar infarct is present in the posteromedial
right cerebellum measuring 14 mm. Other punctate foci are present in
the cerebellum bilaterally. There is a focus in the right occipital
lobe. These areas all demonstrate increased T2 signal compatible
with a subacute time frame.

There is some susceptibility in the high left frontal convexity
which may represent some hemorrhage.

Ventricles are of normal size. No significant extra-axial fluid
collection is present. Internal auditory canals are within normal
limits.

Vascular: Flow is present in the major intracranial arteries.

Skull and upper cervical spine: The skullbase is within normal
limits. Patient is intubated. NG tube is in place.

Sinuses/Orbits: Fluid levels are present in the nasopharynx. There
fluid levels in the sphenoid sinuses and maxillary sinuses
bilaterally. Bilateral mastoid effusions are present. Globes and
orbits are within normal limits.
IMPRESSION: 1. Extensive areas of acute/subacute nonhemorrhagic infarct
throughout the brain with involvement of the globus pallidus
bilaterally. Some of this reflects the sequela of a profound diffuse
anoxic injury. The more peripheral scattered punctate cortical and
cerebellar ischemia is atypical of hypoxia. A central embolic source
such as carotid artery disease or cardiac thrombus were septal
perforation may be related with diffuse embolic disease.
2. Confluent focus of restricted diffusion and signal abnormality
within the anteromedial right temporal lobe. This may be secondary
to seizure focus with source of seizure.
3. Minimal susceptibility over the high left frontal convexity may
represent some petechial hemorrhage.
4. Extensive sinus disease related to intubation and NG tube.
These results were called by telephone at the time of interpretation
on 04/24/2017 at [DATE] to Dr. Siniterra, who verbally acknowledged
these results.
